# Patient Record
Sex: Female | Born: 1935
Health system: Southern US, Community
[De-identification: ages and names within clinical notes are randomized; demographics above are authoritative.]

## PROBLEM LIST (undated history)

## (undated) DIAGNOSIS — IMO0002 Reserved for concepts with insufficient information to code with codable children: Secondary | ICD-10-CM

## (undated) DIAGNOSIS — G709 Myoneural disorder, unspecified: Secondary | ICD-10-CM

## (undated) DIAGNOSIS — I6529 Occlusion and stenosis of unspecified carotid artery: Secondary | ICD-10-CM

## (undated) DIAGNOSIS — I10 Essential (primary) hypertension: Secondary | ICD-10-CM

## (undated) DIAGNOSIS — G629 Polyneuropathy, unspecified: Secondary | ICD-10-CM

## (undated) DIAGNOSIS — W19XXXA Unspecified fall, initial encounter: Secondary | ICD-10-CM

## (undated) DIAGNOSIS — T7840XA Allergy, unspecified, initial encounter: Secondary | ICD-10-CM

## (undated) DIAGNOSIS — F419 Anxiety disorder, unspecified: Secondary | ICD-10-CM

## (undated) DIAGNOSIS — G43109 Migraine with aura, not intractable, without status migrainosus: Secondary | ICD-10-CM

## (undated) DIAGNOSIS — E7800 Pure hypercholesterolemia, unspecified: Secondary | ICD-10-CM

## (undated) DIAGNOSIS — M1990 Unspecified osteoarthritis, unspecified site: Secondary | ICD-10-CM

## (undated) HISTORY — DX: Migraine with aura, not intractable, without status migrainosus: G43.109

## (undated) HISTORY — PX: ABDOMINAL HYSTERECTOMY: SHX81

## (undated) HISTORY — PX: SMALL INTESTINE SURGERY: SHX150

## (undated) HISTORY — DX: Essential (primary) hypertension: I10

## (undated) HISTORY — DX: Unspecified fall, initial encounter: W19.XXXA

## (undated) HISTORY — DX: Pure hypercholesterolemia, unspecified: E78.00

## (undated) HISTORY — DX: Occlusion and stenosis of unspecified carotid artery: I65.29

## (undated) HISTORY — PX: TONSILLECTOMY: SUR1361

## (undated) HISTORY — PX: TUBAL LIGATION: SHX77

## (undated) HISTORY — DX: Anxiety disorder, unspecified: F41.9

## (undated) HISTORY — DX: Allergy, unspecified, initial encounter: T78.40XA

## (undated) HISTORY — DX: Reserved for concepts with insufficient information to code with codable children: IMO0002

## (undated) HISTORY — DX: Polyneuropathy, unspecified: G62.9

## (undated) HISTORY — DX: Unspecified osteoarthritis, unspecified site: M19.90

## (undated) HISTORY — PX: APPENDECTOMY: SHX54

## (undated) HISTORY — DX: Myoneural disorder, unspecified: G70.9

---

## 2004-06-21 ENCOUNTER — Ambulatory Visit: Payer: Self-pay | Admitting: Unknown Physician Specialty

## 2004-06-26 ENCOUNTER — Ambulatory Visit: Payer: Self-pay | Admitting: Unknown Physician Specialty

## 2005-03-29 ENCOUNTER — Ambulatory Visit: Payer: Self-pay | Admitting: Unknown Physician Specialty

## 2008-08-11 ENCOUNTER — Ambulatory Visit: Payer: Self-pay | Admitting: Unknown Physician Specialty

## 2008-12-22 ENCOUNTER — Emergency Department: Payer: Self-pay | Admitting: Emergency Medicine

## 2009-09-07 ENCOUNTER — Ambulatory Visit: Payer: Self-pay | Admitting: Unknown Physician Specialty

## 2009-09-22 ENCOUNTER — Ambulatory Visit: Payer: Self-pay | Admitting: Gastroenterology

## 2010-09-18 LAB — HM PAP SMEAR

## 2010-10-30 LAB — HM MAMMOGRAPHY: HM Mammogram: NORMAL

## 2010-12-06 ENCOUNTER — Ambulatory Visit: Payer: Self-pay | Admitting: Unknown Physician Specialty

## 2011-03-21 ENCOUNTER — Ambulatory Visit (INDEPENDENT_AMBULATORY_CARE_PROVIDER_SITE_OTHER): Payer: Medicare Other | Admitting: Internal Medicine

## 2011-03-21 ENCOUNTER — Encounter: Payer: Self-pay | Admitting: Internal Medicine

## 2011-03-21 DIAGNOSIS — G4700 Insomnia, unspecified: Secondary | ICD-10-CM

## 2011-03-21 DIAGNOSIS — M533 Sacrococcygeal disorders, not elsewhere classified: Secondary | ICD-10-CM

## 2011-03-21 DIAGNOSIS — E785 Hyperlipidemia, unspecified: Secondary | ICD-10-CM | POA: Insufficient documentation

## 2011-03-21 DIAGNOSIS — Z79899 Other long term (current) drug therapy: Secondary | ICD-10-CM

## 2011-03-21 DIAGNOSIS — M810 Age-related osteoporosis without current pathological fracture: Secondary | ICD-10-CM | POA: Insufficient documentation

## 2011-03-21 DIAGNOSIS — F409 Phobic anxiety disorder, unspecified: Secondary | ICD-10-CM | POA: Insufficient documentation

## 2011-03-21 NOTE — Assessment & Plan Note (Signed)
Secondary to DDD by prior orthopedic evaluations.  No dradiation behond thigh .  Managed with massage and behavoral  modificaiton.  Prior reaction to meloxicam caused severe blinding headache so will avoid NSAIDs  if possiblre

## 2011-03-21 NOTE — Assessment & Plan Note (Signed)
ambien dependent.  Has tried alternatives including ativan, rozerem which caused nightmares.  otc benadryl and melatonin.

## 2011-03-21 NOTE — Assessment & Plan Note (Signed)
She is due for CMET now,  Lipids in 3 months.  Records requested. Tolerating statin therapy

## 2011-03-21 NOTE — Progress Notes (Signed)
Subjective:    Patient ID: Laura [name], female    DOB: 08-Nov-1932, 78 y.o.   MRN: [id number]  HPI  Laura [name] is a very pleasant 78 yr old white female with a history of osteoporosis and prior nonvertebral fractures, on intermittent therapy,  Who presents to establish primary care in transfer from Dr. Lin Givens.  She has no chief complaints today except recurrent right sacroiliac back pain which is intermittent and aggravated by certain activities of bending and lifting.  She treats it with massage and stretching.  No daily medications,  Radiates to  Right thigh but not beyond.  Prior orthopedic evaluation suggested DDD.    Past Medical History  Diagnosis Date  . Osteoporosis    No current outpatient prescriptions on file prior to visit.   Review of Systems  Constitutional: Negative for fever, chills and unexpected weight change.  HENT: Negative for hearing loss, ear pain, nosebleeds, congestion, sore throat, facial swelling, rhinorrhea, sneezing, mouth sores, trouble swallowing, neck pain, neck stiffness, voice change, postnasal drip, sinus pressure, tinnitus and ear discharge.   Eyes: Negative for pain, discharge, redness and visual disturbance.  Respiratory: Negative for cough, chest tightness, shortness of breath, wheezing and stridor.   Cardiovascular: Negative for chest pain, palpitations and leg swelling.  Musculoskeletal: Positive for back pain. Negative for myalgias and arthralgias.  Skin: Negative for color change and rash.  Neurological: Negative for dizziness, weakness, light-headedness and headaches.  Hematological: Negative for adenopathy.  Psychiatric/Behavioral: Positive for sleep disturbance.       Objective:   Physical Exam  Constitutional: She is oriented to person, place, and time. She appears well-developed and well-nourished.  HENT:  Mouth/Throat: Oropharynx is clear and moist.  Eyes: EOM are normal. Pupils are equal, round, and reactive to light. No scleral  icterus.  Neck: Normal range of motion. Neck supple. No JVD present. No thyromegaly present.  Cardiovascular: Normal rate, regular rhythm, normal heart sounds and intact distal pulses.   Pulmonary/Chest: Effort normal and breath sounds normal.  Abdominal: Soft. Bowel sounds are normal. She exhibits no mass. There is no tenderness.  Musculoskeletal: Normal range of motion. She exhibits no edema.  Lymphadenopathy:    She has no cervical adenopathy.  Neurological: She is alert and oriented to person, place, and time.  Skin: Skin is warm and dry.  Psychiatric: She has a normal mood and affect.          Assessment & Plan:   Osteoporosis History of fosomax use intermittently,  History of avulsion fracture of right foot and separate incident  involving fall onto outstretched hand causing  right wrist from a fall.  Exercises regularly. Discussed holiday from bisphosphonates  And repeat DEX in one year.   Sacroiliac pain Secondary to DDD by prior orthopedic evaluations.  No dradiation behond thigh .  Managed with massage and behavoral  modificaiton.  Prior reaction to meloxicam caused severe blinding headache so will avoid NSAIDs  if possiblre   Insomnia ambien dependent.  Has tried alternatives including ativan, rozerem which caused nightmares.  otc benadryl and melatonin.    Hyperlipidemia LDL goal < 130 She is due for CMET now,  Lipids in 3 months.  Records requested. Tolerating statin therapy    Updated Medication List Outpatient Encounter Prescriptions as of 03/20/1974  Medication Sig Dispense Refill  . aspirin 81 MG tablet Take 81 mg by mouth daily.      . calcium carbonate (OS-CAL) 600 MG TABS Take 600 mg by mouth 2 (two)  times daily with a meal.      . Cholecalciferol (VITAMIN D3) 1000 UNITS CAPS Take one by mouth twice a day.      . Lutein 20 MG TABS Take one by mouth daily      . pravastatin (PRAVACHOL) 40 MG tablet Take 40 mg by mouth daily.      Marland Kitchen  triamterene-hydrochlorothiazide (MAXZIDE-25) 37.5-25 MG per tablet Take 1 tablet by mouth daily.      Marland Kitchen zolpidem (AMBIEN) 5 MG tablet Take 5 mg by mouth at bedtime as needed.

## 2011-03-21 NOTE — Assessment & Plan Note (Addendum)
History of fosomax use intermittently,  History of avulsion fracture of right foot and separate incident  involving fall onto outstretched hand causing  right wrist from a fall.  Exercises regularly. Discussed holiday from bisphosphonates  And repeat DEX in one year.

## 2011-07-17 ENCOUNTER — Other Ambulatory Visit: Payer: Self-pay | Admitting: Internal Medicine

## 2011-07-17 MED ORDER — ZOLPIDEM TARTRATE 5 MG PO TABS: 5.0000 mg | ORAL_TABLET | Freq: Every evening | ORAL | Status: DC | PRN

## 2011-07-19 ENCOUNTER — Other Ambulatory Visit: Payer: Self-pay | Admitting: Internal Medicine

## 2011-09-18 ENCOUNTER — Encounter: Payer: Self-pay | Admitting: Internal Medicine

## 2011-09-18 ENCOUNTER — Ambulatory Visit (INDEPENDENT_AMBULATORY_CARE_PROVIDER_SITE_OTHER): Payer: Medicare Other | Admitting: Internal Medicine

## 2011-09-18 VITALS — BP 126/82 | HR 78 | Temp 97.9°F | Resp 16 | Wt 159.5 lb

## 2011-09-18 DIAGNOSIS — E785 Hyperlipidemia, unspecified: Secondary | ICD-10-CM

## 2011-09-18 DIAGNOSIS — R5383 Other fatigue: Secondary | ICD-10-CM

## 2011-09-18 DIAGNOSIS — R5381 Other malaise: Secondary | ICD-10-CM

## 2011-09-18 DIAGNOSIS — G5790 Unspecified mononeuropathy of unspecified lower limb: Secondary | ICD-10-CM

## 2011-09-18 DIAGNOSIS — G5793 Unspecified mononeuropathy of bilateral lower limbs: Secondary | ICD-10-CM | POA: Insufficient documentation

## 2011-09-18 DIAGNOSIS — M810 Age-related osteoporosis without current pathological fracture: Secondary | ICD-10-CM

## 2011-09-18 DIAGNOSIS — E538 Deficiency of other specified B group vitamins: Secondary | ICD-10-CM

## 2011-09-18 MED ORDER — TRIAMTERENE-HCTZ 37.5-25 MG PO TABS: 1.0000 | ORAL_TABLET | Freq: Every day | ORAL | Status: DC

## 2011-09-18 MED ORDER — PRAVASTATIN SODIUM 40 MG PO TABS: 40.0000 mg | ORAL_TABLET | Freq: Every day | ORAL | Status: DC

## 2011-09-18 NOTE — Progress Notes (Signed)
Patient ID: Laura [name], female   DOB: [birth year], 78 y.o.   MRN: [id number]  Patient Active Problem List  Diagnosis  . Osteoporosis  . Hyperlipidemia LDL goal < 130  . Sacroiliac pain  . Insomnia  . Neuropathy of both feet    Subjective:  CC:   Chief Complaint  Patient presents with  . Follow-up    6 month    HPI:   Laura Dupreeis a 78 y.o. female who presents for follow up on chronic issues.  She feels generally well but has developed bilateral tingling of the toes on both feet. She has had the symptoms for approximately one year.  No prior workup  .  Symptoms are mild,  And are not causing insomnia.  Has occasional low back pain but no radiculopathy. Has a history of well water use as a child but not in over 30 years.    Past Medical History  Diagnosis Date  . Osteoporosis     Past Surgical History  Procedure Date  . Tubal ligation     at age 79, vaginal complicated by peritonitis  . Abdominal hysterectomy     secondary to peritonitis and complications          The following portions of the patient's history were reviewed and updated as appropriate: Allergies, current medications, and problem list.    Review of Systems:   A comprehensive ROS was done and positive for bilateral foot numbness.  The rest was negative.      History   Social History  . Marital Status: Married    Spouse Name: N/A    Number of Children: N/A  . Years of Education: N/A   Occupational History  . Not on file.   Social History Main Topics  . Smoking status: Former Smoker    Quit date: 03/20/1981  . Smokeless tobacco: Never Used  . Alcohol Use: 8.4 oz/week    14 Glasses of wine per week  . Drug Use: No  . Sexually Active: Not on file   Other Topics Concern  . Not on file   Social History Narrative  . No narrative on file    Objective:  BP 126/82  Pulse 78  Temp 97.9 F (36.6 C) (Oral)  Resp 16  Wt 159 lb 8 oz (72.349 kg)  SpO2 95%  General  appearance: alert, cooperative and appears stated age Ears: normal TM's and external ear canals both ears Throat: lips, mucosa, and tongue normal; teeth and gums normal Neck: no adenopathy, no carotid bruit, supple, symmetrical, trachea midline and thyroid not enlarged, symmetric, no tenderness/mass/nodules Back: symmetric, no curvature. ROM normal. No CVA tenderness. Lungs: clear to auscultation bilaterally Heart: regular rate and rhythm, S1, S2 normal, no murmur, click, rub or gallop Abdomen: soft, non-tender; bowel sounds normal; no masses,  no organomegaly Pulses: 2+ and symmetric Skin: Skin color, texture, turgor normal. No rashes or lesions Lymph nodes: Cervical, supraclavicular, and axillary nodes normal.  Assessment and Plan:  Osteoporosis Last DEXA at [facility] date unclear.    Took over 5 yeas of fosomax ,  fdoes not want treatment unless next DEXA shows worsening of t scores.,  Discussed altnernatives/.   Neuropathy of both feet Will return for serologies for reversible causes. sympotms mild, does not want medication at this time.    Hyperlipidemia LDL goal < 130 Managed with pravasttin . Due for cmet and fasting lipids   Updated Medication List Outpatient Encounter Prescriptions as of 09/18/2011  Medication Sig  Dispense Refill  . aspirin 81 MG tablet Take 81 mg by mouth daily.      . calcium carbonate (OS-CAL) 600 MG TABS Take 600 mg by mouth 2 (two) times daily with a meal.      . Cholecalciferol (VITAMIN D3) 1000 UNITS CAPS Take one by mouth twice a day.      . Lutein 20 MG TABS Take one by mouth daily      . pravastatin (PRAVACHOL) 40 MG tablet Take 1 tablet (40 mg total) by mouth daily.  90 tablet  3  . triamterene-hydrochlorothiazide (MAXZIDE-25) 37.5-25 MG per tablet Take 1 each (1 tablet total) by mouth daily.  90 tablet  3  . zolpidem (AMBIEN) 5 MG tablet Take 1 tablet (5 mg total) by mouth at bedtime as needed.  90 tablet  1  . DISCONTD: pravastatin (PRAVACHOL) 40  MG tablet Take 40 mg by mouth daily.      Marland Kitchen DISCONTD: triamterene-hydrochlorothiazide (MAXZIDE-25) 37.5-25 MG per tablet Take 1 tablet by mouth daily.         Orders Placed This Encounter  Procedures  . HM PAP SMEAR  . Vitamin B12  . TSH  . RPR  . Lipid panel    No Follow-up on file.

## 2011-09-18 NOTE — Patient Instructions (Signed)
Return for fasting labs to evaluate reversible causes of neuropathy and to check lipid status

## 2011-09-18 NOTE — Assessment & Plan Note (Signed)
Last DEXA at [facility] date unclear.    Took over 5 yeas of fosomax ,  fdoes not want treatment unless next DEXA shows worsening of t scores.,  Discussed altnernatives/.

## 2011-09-18 NOTE — Assessment & Plan Note (Addendum)
Managed with pravasttin . Due for cmet and fasting lipids

## 2011-09-18 NOTE — Assessment & Plan Note (Addendum)
Will return for serologies for reversible causes. sympotms mild, does not want medication at this time.

## 2011-09-20 ENCOUNTER — Other Ambulatory Visit: Payer: Medicare Other

## 2011-09-21 ENCOUNTER — Other Ambulatory Visit: Payer: Medicare Other

## 2011-09-27 ENCOUNTER — Telehealth: Payer: Self-pay | Admitting: Internal Medicine

## 2011-09-27 ENCOUNTER — Other Ambulatory Visit (INDEPENDENT_AMBULATORY_CARE_PROVIDER_SITE_OTHER): Payer: Medicare Other | Admitting: *Deleted

## 2011-09-27 DIAGNOSIS — E538 Deficiency of other specified B group vitamins: Secondary | ICD-10-CM

## 2011-09-27 DIAGNOSIS — R5381 Other malaise: Secondary | ICD-10-CM

## 2011-09-27 DIAGNOSIS — R5383 Other fatigue: Secondary | ICD-10-CM

## 2011-09-27 DIAGNOSIS — Z79899 Other long term (current) drug therapy: Secondary | ICD-10-CM

## 2011-09-27 DIAGNOSIS — E785 Hyperlipidemia, unspecified: Secondary | ICD-10-CM

## 2011-09-27 DIAGNOSIS — M810 Age-related osteoporosis without current pathological fracture: Secondary | ICD-10-CM

## 2011-09-27 DIAGNOSIS — G5793 Unspecified mononeuropathy of bilateral lower limbs: Secondary | ICD-10-CM

## 2011-09-27 LAB — COMPREHENSIVE METABOLIC PANEL WITH GFR
ALT: 15 U/L (ref 0–35)
AST: 18 U/L (ref 0–37)
Albumin: 4.1 g/dL (ref 3.5–5.2)
Alkaline Phosphatase: 57 U/L (ref 39–117)
BUN: 14 mg/dL (ref 6–23)
CO2: 26 meq/L (ref 19–32)
Calcium: 9.2 mg/dL (ref 8.4–10.5)
Chloride: 104 meq/L (ref 96–112)
Creatinine, Ser: 0.8 mg/dL (ref 0.4–1.2)
GFR: 79.67 mL/min
Glucose, Bld: 98 mg/dL (ref 70–99)
Potassium: 3.8 meq/L (ref 3.5–5.1)
Sodium: 139 meq/L (ref 135–145)
Total Bilirubin: 0.9 mg/dL (ref 0.3–1.2)
Total Protein: 7 g/dL (ref 6.0–8.3)

## 2011-09-27 LAB — LIPID PANEL
Cholesterol: 212 mg/dL — ABNORMAL HIGH (ref 0–200)
HDL: 90.8 mg/dL (ref >39.00)
Triglycerides: 100 mg/dL (ref 0.0–149.0)
VLDL: 20 mg/dL (ref 0.0–40.0)

## 2011-09-27 NOTE — Telephone Encounter (Signed)
Now.  Ordered

## 2011-09-27 NOTE — Telephone Encounter (Signed)
Pt came in today  Her last bone density was 09/22/09 Please advise when she needs another one [place]

## 2011-09-28 LAB — RPR

## 2011-10-08 ENCOUNTER — Ambulatory Visit: Payer: Medicare Other

## 2011-10-22 ENCOUNTER — Ambulatory Visit (INDEPENDENT_AMBULATORY_CARE_PROVIDER_SITE_OTHER): Payer: Medicare Other | Admitting: Internal Medicine

## 2011-10-22 DIAGNOSIS — E538 Deficiency of other specified B group vitamins: Secondary | ICD-10-CM

## 2011-10-22 MED ORDER — CYANOCOBALAMIN 1000 MCG/ML IJ SOLN
1000.0000 ug | Freq: Once | INTRAMUSCULAR | Status: AC
  Administered 2011-10-22: 1000 ug via INTRAMUSCULAR

## 2011-10-29 ENCOUNTER — Ambulatory Visit (INDEPENDENT_AMBULATORY_CARE_PROVIDER_SITE_OTHER): Payer: Medicare Other | Admitting: Internal Medicine

## 2011-10-29 ENCOUNTER — Telehealth: Payer: Self-pay | Admitting: Family Medicine

## 2011-10-29 ENCOUNTER — Encounter: Payer: Self-pay | Admitting: Internal Medicine

## 2011-10-29 DIAGNOSIS — E538 Deficiency of other specified B group vitamins: Secondary | ICD-10-CM

## 2011-10-29 MED ORDER — CYANOCOBALAMIN 1000 MCG/ML IJ SOLN
1000.0000 ug | Freq: Once | INTRAMUSCULAR | Status: AC
  Administered 2011-10-29: 1000 ug via INTRAMUSCULAR

## 2011-10-29 NOTE — Progress Notes (Signed)
Patient ID: Laura [name], female   DOB: 1932-02-23, 78 y.o.   MRN: [id number] Patient is here for her B12 injection

## 2011-11-05 ENCOUNTER — Ambulatory Visit (INDEPENDENT_AMBULATORY_CARE_PROVIDER_SITE_OTHER): Payer: Medicare Other | Admitting: Internal Medicine

## 2011-11-05 DIAGNOSIS — E538 Deficiency of other specified B group vitamins: Secondary | ICD-10-CM

## 2011-11-05 MED ORDER — CYANOCOBALAMIN 1000 MCG/ML IJ SOLN
1000.0000 ug | Freq: Once | INTRAMUSCULAR | Status: AC
  Administered 2011-11-05: 1000 ug via INTRAMUSCULAR

## 2011-11-07 NOTE — Progress Notes (Signed)
Patient ID: Laura [name], female   DOB: Jun 22, 1932, 78 y.o.   MRN: [id number] The patient is here for her B12 injection.

## 2011-11-14 ENCOUNTER — Ambulatory Visit (INDEPENDENT_AMBULATORY_CARE_PROVIDER_SITE_OTHER): Payer: Medicare Other | Admitting: Internal Medicine

## 2011-11-14 DIAGNOSIS — Z23 Encounter for immunization: Secondary | ICD-10-CM

## 2011-11-14 DIAGNOSIS — E538 Deficiency of other specified B group vitamins: Secondary | ICD-10-CM

## 2011-11-14 MED ORDER — CYANOCOBALAMIN 1000 MCG/ML IJ SOLN
1000.0000 ug | Freq: Once | INTRAMUSCULAR | Status: AC
  Administered 2011-11-14: 1000 ug via INTRAMUSCULAR

## 2011-11-17 NOTE — Progress Notes (Signed)
Patient ID: Laura [name], female   DOB: 1932/09/22, 78 y.o.   MRN: [id number] Patient is here for her monthly B12 injection.

## 2011-11-20 ENCOUNTER — Ambulatory Visit: Payer: Self-pay | Admitting: Internal Medicine

## 2011-11-25 ENCOUNTER — Telehealth: Payer: Self-pay | Admitting: Internal Medicine

## 2011-11-25 NOTE — Telephone Encounter (Signed)
DEXA scan received. I have tried to compare with her scores from her 2011 DEXA scan that was done external clinic and because they were done on different machines there measuring quite differently. Her T. scores on her hips and spine are in the osteopenia range and her T score on her forearm is in the osteoporosis we will discuss this in more detail at her next visit. There is no urgent need to do anything except continue calcium supplementation (1200 to 1800 mg daily through diet and supplements) , u vitamin D (10000 units dailyshe is vitamin D deficient by testing ) and weightbearing exercise.

## 2011-11-26 NOTE — Telephone Encounter (Signed)
Patient informed of dexa results.  

## 2011-12-03 ENCOUNTER — Encounter: Payer: Self-pay | Admitting: Internal Medicine

## 2011-12-05 LAB — HM DEXA SCAN

## 2011-12-21 NOTE — Telephone Encounter (Signed)
Opened in error

## 2012-01-18 ENCOUNTER — Other Ambulatory Visit: Payer: Self-pay | Admitting: Internal Medicine

## 2012-01-18 NOTE — Telephone Encounter (Signed)
Refill request for Ambien 5 mg # 90 ok to refill?

## 2012-01-18 NOTE — Telephone Encounter (Signed)
Zolpidem 5 mg   [facility] on [address]  90 day

## 2012-01-20 NOTE — Telephone Encounter (Signed)
Approved with 1 refill

## 2012-01-21 NOTE — Telephone Encounter (Signed)
Medication filled.  

## 2012-02-21 ENCOUNTER — Ambulatory Visit: Payer: Self-pay | Admitting: Internal Medicine

## 2012-03-05 ENCOUNTER — Encounter: Payer: Self-pay | Admitting: Internal Medicine

## 2012-04-01 ENCOUNTER — Ambulatory Visit: Payer: Medicare Other | Admitting: Internal Medicine

## 2012-04-15 ENCOUNTER — Ambulatory Visit: Payer: Medicare Other | Admitting: Internal Medicine

## 2012-04-16 ENCOUNTER — Ambulatory Visit (INDEPENDENT_AMBULATORY_CARE_PROVIDER_SITE_OTHER): Payer: Medicare Other | Admitting: Internal Medicine

## 2012-04-16 ENCOUNTER — Encounter: Payer: Self-pay | Admitting: Internal Medicine

## 2012-04-16 VITALS — BP 120/80 | HR 78 | Temp 98.0°F | Ht 64.0 in | Wt 151.2 lb

## 2012-04-16 DIAGNOSIS — F411 Generalized anxiety disorder: Secondary | ICD-10-CM | POA: Insufficient documentation

## 2012-04-16 DIAGNOSIS — E538 Deficiency of other specified B group vitamins: Secondary | ICD-10-CM

## 2012-04-16 DIAGNOSIS — R4589 Other symptoms and signs involving emotional state: Secondary | ICD-10-CM | POA: Insufficient documentation

## 2012-04-16 DIAGNOSIS — Z79899 Other long term (current) drug therapy: Secondary | ICD-10-CM

## 2012-04-16 DIAGNOSIS — G609 Hereditary and idiopathic neuropathy, unspecified: Secondary | ICD-10-CM

## 2012-04-16 DIAGNOSIS — G4700 Insomnia, unspecified: Secondary | ICD-10-CM

## 2012-04-16 DIAGNOSIS — F418 Other specified anxiety disorders: Secondary | ICD-10-CM | POA: Insufficient documentation

## 2012-04-16 LAB — COMPREHENSIVE METABOLIC PANEL
BUN: 17 mg/dL (ref 6–23)
CO2: 26 mEq/L (ref 19–32)
Calcium: 10.2 mg/dL (ref 8.4–10.5)
Chloride: 100 mEq/L (ref 96–112)
Creatinine, Ser: 0.7 mg/dL (ref 0.4–1.2)
GFR: 89.07 mL/min (ref >60.00)

## 2012-04-16 LAB — POCT URINALYSIS DIPSTICK
Glucose, UA: NEGATIVE
Leukocytes, UA: NEGATIVE
Nitrite, UA: NEGATIVE
Urobilinogen, UA: 0.2

## 2012-04-16 MED ORDER — CYANOCOBALAMIN 1000 MCG/ML IJ SOLN
1000.0000 ug | Freq: Once | INTRAMUSCULAR | Status: AC
  Administered 2012-04-16: 1000 ug via INTRAMUSCULAR

## 2012-04-16 MED ORDER — ALPRAZOLAM 0.5 MG PO TABS: 0.5000 mg | ORAL_TABLET | Freq: Every evening | ORAL | Status: DC | PRN

## 2012-04-16 MED ORDER — CYANOCOBALAMIN 2500 MCG SL SUBL: 1.0000 | SUBLINGUAL_TABLET | Freq: Every day | SUBLINGUAL | Status: DC

## 2012-04-16 MED ORDER — ZOLPIDEM TARTRATE 10 MG PO TABS: ORAL_TABLET | ORAL | Status: DC

## 2012-04-16 NOTE — Patient Instructions (Addendum)
You can continue 2 advil nightly.  Take 20 mg famotidine daily  to protect stomach  I am refilling the ambien at a 10 mg dose so you can break it in half and use as needed  Resume b12 supplememataiton wit a sublingual tablet dailoy   Alprazolam may be used as needed not more than once daily for anxiety or early wakening

## 2012-04-16 NOTE — Progress Notes (Signed)
Patient ID: Laura [name], female   DOB: 10-Mar-1932, 79 y.o.   MRN: [id number]  Patient Active Problem List  Diagnosis  . Osteoporosis  . Hyperlipidemia LDL goal < 130  . Sacroiliac pain  . Insomnia  . Neuropathy of both feet  . B12 deficiency  . Anxiety state, unspecified    Subjective:  CC:   Chief Complaint  Patient presents with  . Follow-up    6 month    HPI: For   Laura [name] a 79 y.o. female who presents 6 month follow up.  Chronic complaints.  Received 3 weekly b12 injectiosn in October but was lostto follow up after husband's hospitalization.  Her neuroapathy has not improved.   Using advil for hip pain  2 every night ,  Which alleviates her pain .  Worried if it is too much .  She has no history of CKD, hypertension or abd pain.  Worsening emotional stress due to husband's failing health.  Taking 10 ambien most days bc of insomnia. starts with 5 mg .  Husband Laura [name] was admitted ofr symptomatic anemia,  EGD.colonoscopy was normal.  Transfused 3 units of blood.  Oncologist advised starting chemo and they are both afraid of neuropathy getting worse. 2 treatments thus far .  Patient is now homebound      Past Medical History  Diagnosis Date  . Osteoporosis     Past Surgical History  Procedure Laterality Date  . Tubal ligation      at age 6, vaginal complicated by peritonitis  . Abdominal hysterectomy      secondary to peritonitis and complications        The following portions of the patient's history were reviewed and updated as appropriate: Allergies, current medications, and problem list.    Review of Systems:   Patient denies headache, fevers, malaise, unintentional weight loss, skin rash, eye pain, sinus congestion and sinus pain, sore throat, dysphagia,  hemoptysis , cough, dyspnea, wheezing, chest pain, palpitations, orthopnea, edema, abdominal pain, nausea, melena, diarrhea, constipation, flank pain, dysuria, hematuria, urinary  Frequency,  nocturia, numbness, tingling, seizures,  Focal weakness, Loss of consciousness,  Tremor, insomnia, depression, anxiety, and suicidal ideation.     History   Social History  . Marital Status: Married    Spouse Name: N/A    Number of Children: N/A  . Years of Education: N/A   Occupational History  . Not on file.   Social History Main Topics  . Smoking status: Former Smoker    Quit date: 03/20/1981  . Smokeless tobacco: Never Used  . Alcohol Use: 8.4 oz/week    14 Glasses of wine per week  . Drug Use: No  . Sexually Active: Not on file   Other Topics Concern  . Not on file   Social History Narrative  . No narrative on file    Objective:  BP 120/80  Pulse 78  Temp(Src) 98 F (36.7 C) (Oral)  Ht 5\' 4"  (1.626 m)  Wt 151 lb 4 oz (68.607 kg)  BMI 25.95 kg/m2  SpO2 94%  General appearance: alert, cooperative and appears stated age Ears: normal TM's and external ear canals both ears Throat: lips, mucosa, and tongue normal; teeth and gums normal Neck: no adenopathy, no carotid bruit, supple, symmetrical, trachea midline and thyroid not enlarged, symmetric, no tenderness/mass/nodules Back: symmetric, no curvature. ROM normal. No CVA tenderness. Lungs: clear to auscultation bilaterally Heart: regular rate and rhythm, S1, S2 normal, no murmur, click, rub or gallop Abdomen:  soft, non-tender; bowel sounds normal; no masses,  no organomegaly Pulses: 2+ and symmetric Skin: Skin color, texture, turgor normal. No rashes or lesions Lymph nodes: Cervical, supraclavicular, and axillary nodes normal.  Assessment and Plan:  B12 deficiency Resume supplemention with IM injection followed by daily sublinguals  Insomnia Using 5 mg ambien nightly with early wakening due to husband's need. managed with additional 5 mg ambien   Anxiety state, unspecified Low dose alprazolam for prn use .   Updated Medication List Outpatient Encounter Prescriptions as of 04/16/2012  Medication Sig  Dispense Refill  . aspirin 81 MG tablet Take 81 mg by mouth daily.      . calcium carbonate (OS-CAL) 600 MG TABS Take 600 mg by mouth 2 (two) times daily with a meal.      . Cholecalciferol (VITAMIN D3) 1000 UNITS CAPS Take one by mouth twice a day.      . cyanocobalamin 1000 MCG tablet Inject 100 mcg into the muscle once a week. Weekly x 3 weeks then monthly      . Lutein 20 MG TABS Take one by mouth daily      . pravastatin (PRAVACHOL) 40 MG tablet Take 1 tablet (40 mg total) by mouth daily.  90 tablet  3  . triamterene-hydrochlorothiazide (MAXZIDE-25) 37.5-25 MG per tablet Take 1 each (1 tablet total) by mouth daily.  90 tablet  3  . zolpidem (AMBIEN) 10 MG tablet take 1 tablet by mouth at bedtime if needed  30 tablet  5  . [DISCONTINUED] zolpidem (AMBIEN) 5 MG tablet take 1 tablet by mouth at bedtime if needed  30 tablet  1  . ALPRAZolam (XANAX) 0.5 MG tablet Take 1 tablet (0.5 mg total) by mouth at bedtime as needed for sleep.  30 tablet  3  . Cyanocobalamin 2500 MCG SUBL Place 1 tablet (2,500 mcg total) under the tongue daily.  30 tablet  11  . [EXPIRED] cyanocobalamin ((VITAMIN B-12)) injection 1,000 mcg        No facility-administered encounter medications on file as of 04/16/2012.     Orders Placed This Encounter  Procedures  . HM MAMMOGRAPHY  . Comprehensive metabolic panel  . POCT urinalysis dipstick    No Follow-up on file.

## 2012-04-16 NOTE — Assessment & Plan Note (Signed)
Resume supplemention with IM injection followed by daily sublinguals

## 2012-04-16 NOTE — Assessment & Plan Note (Signed)
Low dose alprazolam for prn use .

## 2012-04-16 NOTE — Assessment & Plan Note (Signed)
Using 5 mg ambien nightly with early wakening due to husband's need. managed with additional 5 mg Remus Loffler

## 2012-04-17 ENCOUNTER — Encounter: Payer: Self-pay | Admitting: Internal Medicine

## 2012-09-03 ENCOUNTER — Other Ambulatory Visit: Payer: Self-pay

## 2012-10-15 ENCOUNTER — Ambulatory Visit: Payer: Self-pay | Admitting: Internal Medicine

## 2012-10-17 ENCOUNTER — Ambulatory Visit (INDEPENDENT_AMBULATORY_CARE_PROVIDER_SITE_OTHER): Payer: Medicare PPO | Admitting: Internal Medicine

## 2012-10-17 ENCOUNTER — Encounter: Payer: Self-pay | Admitting: Internal Medicine

## 2012-10-17 VITALS — BP 128/72 | HR 92 | Temp 98.4°F | Resp 14 | Ht 64.0 in | Wt 143.2 lb

## 2012-10-17 DIAGNOSIS — R634 Abnormal weight loss: Secondary | ICD-10-CM

## 2012-10-17 DIAGNOSIS — E538 Deficiency of other specified B group vitamins: Secondary | ICD-10-CM

## 2012-10-17 DIAGNOSIS — F4321 Adjustment disorder with depressed mood: Secondary | ICD-10-CM

## 2012-10-17 DIAGNOSIS — E559 Vitamin D deficiency, unspecified: Secondary | ICD-10-CM

## 2012-10-17 DIAGNOSIS — Z79899 Other long term (current) drug therapy: Secondary | ICD-10-CM

## 2012-10-17 DIAGNOSIS — E785 Hyperlipidemia, unspecified: Secondary | ICD-10-CM

## 2012-10-17 DIAGNOSIS — Z23 Encounter for immunization: Secondary | ICD-10-CM

## 2012-10-17 DIAGNOSIS — I10 Essential (primary) hypertension: Secondary | ICD-10-CM

## 2012-10-17 DIAGNOSIS — M810 Age-related osteoporosis without current pathological fracture: Secondary | ICD-10-CM

## 2012-10-17 DIAGNOSIS — R5381 Other malaise: Secondary | ICD-10-CM

## 2012-10-17 LAB — COMPREHENSIVE METABOLIC PANEL
Alkaline Phosphatase: 50 U/L (ref 39–117)
BUN: 15 mg/dL (ref 6–23)
CO2: 29 mEq/L (ref 19–32)
Creatinine, Ser: 0.7 mg/dL (ref 0.4–1.2)
GFR: 80.69 mL/min (ref >60.00)
Glucose, Bld: 97 mg/dL (ref 70–99)
Sodium: 138 mEq/L (ref 135–145)
Total Bilirubin: 0.6 mg/dL (ref 0.3–1.2)

## 2012-10-17 LAB — TSH: TSH: 1.64 u[IU]/mL (ref 0.35–5.50)

## 2012-10-17 LAB — VITAMIN B12: Vitamin B-12: 741 pg/mL (ref 211–911)

## 2012-10-17 MED ORDER — PRAVASTATIN SODIUM 40 MG PO TABS: 40.0000 mg | ORAL_TABLET | Freq: Every day | ORAL | Status: DC

## 2012-10-17 MED ORDER — ZOLPIDEM TARTRATE 10 MG PO TABS: ORAL_TABLET | ORAL | Status: DC

## 2012-10-17 MED ORDER — TRIAMTERENE-HCTZ 37.5-25 MG PO TABS: 1.0000 | ORAL_TABLET | Freq: Every day | ORAL | Status: DC

## 2012-10-17 NOTE — Patient Instructions (Addendum)
We will schedule your DEXA scan in November  If you find yourself needing the xanax (alprazolam) on a regular basis,  Please come back so we can consider a safer daily medication to help you "bounce back"

## 2012-10-17 NOTE — Progress Notes (Signed)
Patient ID: Laura [name], female   DOB: [birth year], 79 y.o.   MRN: [id number]  Patient Active Problem List   Diagnosis Date Noted  . Grief 10/19/2012  . HTN (hypertension) 10/19/2012  . Loss of weight 10/17/2012  . B12 deficiency 04/16/2012  . Anxiety state, unspecified 04/16/2012  . Neuropathy of both feet 09/18/2011  . Hyperlipidemia LDL goal < 130 03/21/2011  . Sacroiliac pain 03/21/2011  . Insomnia 03/21/2011  . Osteoporosis     Subjective:  CC:   Chief Complaint  Patient presents with  . Follow-up    6 month follow up    HPI:   Laura Dupreeis a 79 y.o. female who presents 6 month follow up on chronic condition including B12 deficiency anemia, hypertension, chronic insomnia and arthritis of hip .  Her beloved husband of 51 years passed away 2 weeks ago after fighting metastatic prostate CA for many years.  He spent the last 8 weeks in a hospital bed at home with Hospice in place.  She is having difficulty accepting that he is no longer with her, despite his protracted illness. She has spent the last 2 years of her life to taking care of him 24/7 and now feels a very large void in her life . She has a very strong social support system. She's having some trouble sleeping she has not tried the alprazolam that I gave her at her last visit. She is afraid of becoming hooked on it..  she has very recently adopted some healthy coping behaviors including joining a gym and going for the first time in years 2 days ago. She is going out and meeting friends for dinner. All of her children have been very supportive and were physicially present in the father's lives in the weeks up leading up to his death    Past Medical History  Diagnosis Date  . Osteoporosis     Past Surgical History  Procedure Laterality Date  . Tubal ligation      at age 32, vaginal complicated by peritonitis  . Abdominal hysterectomy      secondary to peritonitis and complications        The following  portions of the patient's history were reviewed and updated as appropriate: Allergies, current medications, and problem list.    Review of Systems:   12 Pt  review of systems was negative except those addressed in the HPI,     History   Social History  . Marital Status: Married    Spouse Name: N/A    Number of Children: N/A  . Years of Education: N/A   Occupational History  . Not on file.   Social History Main Topics  . Smoking status: Former Smoker    Quit date: 03/20/1981  . Smokeless tobacco: Never Used  . Alcohol Use: 8.4 oz/week    14 Glasses of wine per week  . Drug Use: No  . Sexual Activity: Not on file   Other Topics Concern  . Not on file   Social History Narrative  . No narrative on file    Objective:  Filed Vitals:   10/17/12 1335  BP: 128/72  Pulse: 92  Temp: 98.4 F (36.9 C)  Resp: 14     General appearance: alert, cooperative and appears stated age Ears: normal TM's and external ear canals both ears Throat: lips, mucosa, and tongue normal; teeth and gums normal Neck: no adenopathy, no carotid bruit, supple, symmetrical, trachea midline and thyroid not enlarged, symmetric, no  tenderness/mass/nodules Back: symmetric, no curvature. ROM normal. No CVA tenderness. Lungs: clear to auscultation bilaterally Heart: regular rate and rhythm, S1, S2 normal, no murmur, click, rub or gallop Abdomen: soft, non-tender; bowel sounds normal; no masses,  no organomegaly Pulses: 2+ and symmetric Skin: Skin color, texture, turgor normal. No rashes or lesions Lymph nodes: Cervical, supraclavicular, and axillary nodes normal.  Assessment and Plan:  Grief She is coping with her grief rather well. Have encouraged her to use the alprazolam if needed for insomnia or anxiety. We'll see her back in one month if she continues to need assistance.  Hyperlipidemia LDL goal < 130 Managed with pravastatin. No side effects. LDL is at goal and hepatic enzymes are normal.  No changes today.  B12 deficiency Managed with sublingual B12 supplements, as she is not inclined to self administer B12 injections. B12 level is pending.  Loss of weight Secondary to grief and anxiety. Diet has been reviewed. She has no signs or symptoms of malnutrition.  HTN (hypertension) Well controlled on current regimen. Renal function stable, no changes today.   Updated Medication List Outpatient Encounter Prescriptions as of 10/17/2012  Medication Sig Dispense Refill  . ALPRAZolam (XANAX) 0.5 MG tablet Take 1 tablet (0.5 mg total) by mouth at bedtime as needed for sleep.  30 tablet  3  . aspirin 81 MG tablet Take 81 mg by mouth daily.      . calcium carbonate (OS-CAL) 600 MG TABS Take 600 mg by mouth 2 (two) times daily with a meal.      . Cholecalciferol (VITAMIN D3) 1000 UNITS CAPS Take one by mouth twice a day.      . cyanocobalamin 1000 MCG tablet Inject 100 mcg into the muscle once a week. Weekly x 3 weeks then monthly      . Cyanocobalamin 2500 MCG SUBL Place 1 tablet (2,500 mcg total) under the tongue daily.  30 tablet  11  . Lutein 20 MG TABS Take one by mouth daily      . pravastatin (PRAVACHOL) 40 MG tablet Take 1 tablet (40 mg total) by mouth daily.  90 tablet  3  . triamterene-hydrochlorothiazide (MAXZIDE-25) 37.5-25 MG per tablet Take 1 tablet by mouth daily.  90 tablet  3  . zolpidem (AMBIEN) 10 MG tablet take 1 tablet by mouth at bedtime if needed  30 tablet  5  . [DISCONTINUED] pravastatin (PRAVACHOL) 40 MG tablet Take 1 tablet (40 mg total) by mouth daily.  90 tablet  3  . [DISCONTINUED] triamterene-hydrochlorothiazide (MAXZIDE-25) 37.5-25 MG per tablet Take 1 each (1 tablet total) by mouth daily.  90 tablet  3  . [DISCONTINUED] zolpidem (AMBIEN) 10 MG tablet take 1 tablet by mouth at bedtime if needed  30 tablet  5   No facility-administered encounter medications on file as of 10/17/2012.     Orders Placed This Encounter  Procedures  . Flu Vaccine QUAD 36+  mos PF IM (Fluarix)  . Comprehensive metabolic panel  . TSH  . Vit D  25 hydroxy (rtn osteoporosis monitoring)  . Vitamin B12    No Follow-up on file.

## 2012-10-18 ENCOUNTER — Encounter: Payer: Self-pay | Admitting: Internal Medicine

## 2012-10-18 LAB — VITAMIN D 25 HYDROXY (VIT D DEFICIENCY, FRACTURES): Vit D, 25-Hydroxy: 55 ng/mL (ref 30–89)

## 2012-10-19 ENCOUNTER — Encounter: Payer: Self-pay | Admitting: Internal Medicine

## 2012-10-19 DIAGNOSIS — F4321 Adjustment disorder with depressed mood: Secondary | ICD-10-CM | POA: Insufficient documentation

## 2012-10-19 DIAGNOSIS — I10 Essential (primary) hypertension: Secondary | ICD-10-CM | POA: Insufficient documentation

## 2012-10-19 NOTE — Assessment & Plan Note (Signed)
Well controlled on current regimen. Renal function stable, no changes today. 

## 2012-10-19 NOTE — Assessment & Plan Note (Signed)
Managed with pravastatin. No side effects. LDL is at goal and hepatic enzymes are normal. No changes today.

## 2012-10-19 NOTE — Assessment & Plan Note (Signed)
Managed with sublingual B12 supplements, as she is not inclined to self administer B12 injections. B12 level is pending.

## 2012-10-19 NOTE — Assessment & Plan Note (Signed)
Secondary to grief and anxiety. Diet has been reviewed. She has no signs or symptoms of malnutrition.

## 2012-10-19 NOTE — Assessment & Plan Note (Signed)
She is coping with her grief rather well. Have encouraged her to use the alprazolam if needed for insomnia or anxiety. We'll see her back in one month if she continues to need assistance.

## 2012-12-31 ENCOUNTER — Ambulatory Visit (INDEPENDENT_AMBULATORY_CARE_PROVIDER_SITE_OTHER): Payer: Medicare PPO | Admitting: Internal Medicine

## 2012-12-31 ENCOUNTER — Encounter: Payer: Self-pay | Admitting: Internal Medicine

## 2012-12-31 VITALS — BP 112/78 | HR 61 | Temp 98.0°F | Resp 14 | Ht 64.0 in | Wt 143.2 lb

## 2012-12-31 DIAGNOSIS — Z90710 Acquired absence of both cervix and uterus: Secondary | ICD-10-CM

## 2012-12-31 DIAGNOSIS — M533 Sacrococcygeal disorders, not elsewhere classified: Secondary | ICD-10-CM

## 2012-12-31 DIAGNOSIS — M810 Age-related osteoporosis without current pathological fracture: Secondary | ICD-10-CM

## 2012-12-31 DIAGNOSIS — R634 Abnormal weight loss: Secondary | ICD-10-CM

## 2012-12-31 DIAGNOSIS — E538 Deficiency of other specified B group vitamins: Secondary | ICD-10-CM

## 2012-12-31 DIAGNOSIS — I10 Essential (primary) hypertension: Secondary | ICD-10-CM

## 2012-12-31 MED ORDER — TETANUS-DIPHTH-ACELL PERTUSSIS 5-2.5-18.5 LF-MCG/0.5 IM SUSP: 0.5000 mL | Freq: Once | INTRAMUSCULAR | Status: DC

## 2012-12-31 MED ORDER — TRAMADOL HCL 50 MG PO TABS: 50.0000 mg | ORAL_TABLET | Freq: Three times a day (TID) | ORAL | Status: DC | PRN

## 2012-12-31 MED ORDER — PREDNISONE (PAK) 10 MG PO TABS: ORAL_TABLET | ORAL | Status: DC

## 2012-12-31 NOTE — Patient Instructions (Signed)
I think you have bursitis of the ischial tuberosity  I am prescribing prednisone taper ,  And tramadol as needed for pain .  Do not take Advil  You are on this  Stay active but avoid strenuous lower extremity activities.   X rays of pelvis at your leisure at the [facility] office

## 2012-12-31 NOTE — Progress Notes (Signed)
Patient ID: Laura [name], female   DOB: [birth year], 79 y.o.   MRN: [id number]   Patient Active Problem List   Diagnosis Date Noted  . Grief 10/19/2012  . HTN (hypertension) 10/19/2012  . Loss of weight 10/17/2012  . B12 deficiency 04/16/2012  . Anxiety state, unspecified 04/16/2012  . Neuropathy of both feet 09/18/2011  . Hyperlipidemia LDL goal < 130 03/21/2011  . Sacroiliac pain 03/21/2011  . Insomnia 03/21/2011  . Osteoporosis     Subjective:  CC:   Chief Complaint  Patient presents with  . Back Pain    lower back, sacral area    HPI:   Laura Dupreeis a 79 y.o. female who presents Low back pain .  She has been having  stiffness and pain  For the last several weeks with focal tenderness over the right Right ischial tuberosity.  Her pain is aggravated by prolonged sitting  And exercising .  It does not radiate down either leg.  There is no history of trauma, joint replacement, or overuse.  Denies  Fevers,  And previously noted weight loss has stabilized.    Past Medical History  Diagnosis Date  . Osteoporosis     Past Surgical History  Procedure Laterality Date  . Tubal ligation      at age 38, vaginal complicated by peritonitis  . Abdominal hysterectomy      secondary to peritonitis and complications        The following portions of the patient's history were reviewed and updated as appropriate: Allergies, current medications, and problem list.    Review of Systems:   12 Pt  review of systems was negative except those addressed in the HPI,     History   Social History  . Marital Status: Married    Spouse Name: N/A    Number of Children: N/A  . Years of Education: N/A   Occupational History  . Not on file.   Social History Main Topics  . Smoking status: Former Smoker    Quit date: 03/20/1981  . Smokeless tobacco: Never Used  . Alcohol Use: 8.4 oz/week    14 Glasses of wine per week  . Drug Use: No  . Sexual Activity: Not on file   Other  Topics Concern  . Not on file   Social History Narrative  . No narrative on file    Objective:  Filed Vitals:   12/31/12 1624  BP: 112/78  Pulse: 61  Temp: 98 F (36.7 C)  Resp: 14     General appearance: alert, cooperative and appears stated age Neck: no adenopathy, no carotid bruit, supple, symmetrical, trachea midline and thyroid not enlarged, symmetric, no tenderness/mass/nodules Back: symmetric, no curvature. ROM normal. No CVA tenderness  Right SI joint tender,  R ischial tuberosity tender,  No erythema or effusions  Lungs: clear to auscultation bilaterally Heart: regular rate and rhythm, S1, S2 normal, no murmur, click, rub or gallop Abdomen: soft, non-tender; bowel sounds normal; no masses,  no organomegaly Pulses: 2+ and symmetric Skin: Skin color, texture, turgor normal. No rashes or lesions Lymph nodes: Cervical, supraclavicular, and axillary nodes normal. Neuro: CNs 2-12 intact. DTRs 2+/4 in biceps, brachioradialis, patellars and achilles. Muscle strength 5/5 in upper and lower exremities. Fine resting tremor bilaterally both hands cerebellar function normal. Romberg negative.  No pronator drift.   Gait normal.   Assessment and Plan:  Loss of weight 16 lbs over the past year, unintentional, secondary to emotional stressors including the decline  and recent loss of beloved husband Chanetta Marshall.   Sacroiliac pain SI joint arthriitis accompanied by bursitis of the right ischial tuberosity.   Prednisone taper. tramadol prn , and light exercise. Palin films ordered to rule out fracture and lytic lesions.   HTN (hypertension) Well controlled on current regimen. Renal function stable, no changes today.  B12 deficiency Managed with sublingual supplementation.  Last level significantly improved.   Osteoporosis By T scores of wrist only Nov 2013 DEXA.  Other T scores indicate mild osteopenia .  Contineu calcium,. D and wt bearing exercise.  Repeat DEXA in 2015/    Updated  Medication List Outpatient Encounter Prescriptions as of 12/31/2012  Medication Sig  . ALPRAZolam (XANAX) 0.5 MG tablet Take 1 tablet (0.5 mg total) by mouth at bedtime as needed for sleep.  Marland Kitchen aspirin 81 MG tablet Take 81 mg by mouth daily.  . calcium carbonate (OS-CAL) 600 MG TABS Take 600 mg by mouth 2 (two) times daily with a meal.  . Cholecalciferol (VITAMIN D3) 1000 UNITS CAPS Take one by mouth twice a day.  . Cyanocobalamin 2500 MCG SUBL Place 1 tablet (2,500 mcg total) under the tongue daily.  . Lutein 20 MG TABS Take one by mouth daily  . pravastatin (PRAVACHOL) 40 MG tablet Take 1 tablet (40 mg total) by mouth daily.  Marland Kitchen triamterene-hydrochlorothiazide (MAXZIDE-25) 37.5-25 MG per tablet Take 1 tablet by mouth daily.  Marland Kitchen zolpidem (AMBIEN) 10 MG tablet take 1 tablet by mouth at bedtime if needed  . cyanocobalamin 1000 MCG tablet Inject 100 mcg into the muscle once a week. Weekly x 3 weeks then monthly  . predniSONE (STERAPRED UNI-PAK) 10 MG tablet 6 tablets on Day 1 , then reduce by 1 tablet daily until gone  . Tdap (BOOSTRIX) 5-2.5-18.5 LF-MCG/0.5 injection Inject 0.5 mLs into the muscle once.  . traMADol (ULTRAM) 50 MG tablet Take 1 tablet (50 mg total) by mouth every 8 (eight) hours as needed.

## 2012-12-31 NOTE — Progress Notes (Signed)
Pre-visit discussion using our clinic review tool. No additional management support is needed unless otherwise documented below in the visit note.  

## 2012-12-31 NOTE — Assessment & Plan Note (Addendum)
16 lbs over the past year, unintentional, secondary to emotional stressors including the decline and recent loss of beloved husband Laura [name].

## 2013-01-02 ENCOUNTER — Ambulatory Visit (INDEPENDENT_AMBULATORY_CARE_PROVIDER_SITE_OTHER)
Admission: RE | Admit: 2013-01-02 | Discharge: 2013-01-02 | Disposition: A | Discharge status: Home / Self Care | Payer: Medicare PPO | Source: Ambulatory Visit | Attending: Internal Medicine | Admitting: Internal Medicine

## 2013-01-02 DIAGNOSIS — R634 Abnormal weight loss: Secondary | ICD-10-CM

## 2013-01-03 DIAGNOSIS — Z90710 Acquired absence of both cervix and uterus: Secondary | ICD-10-CM | POA: Insufficient documentation

## 2013-01-03 NOTE — Assessment & Plan Note (Signed)
By T scores of wrist only Nov 2013 DEXA.  Other T scores indicate mild osteopenia .  Contineu calcium,. D and wt bearing exercise.  Repeat DEXA in 2015/

## 2013-01-03 NOTE — Assessment & Plan Note (Signed)
Well controlled on current regimen. Renal function stable, no changes today. 

## 2013-01-03 NOTE — Assessment & Plan Note (Addendum)
SI joint arthriitis accompanied by bursitis of the right ischial tuberosity.   Prednisone taper. tramadol prn , and light exercise. Palin films ordered to rule out fracture and lytic lesions.

## 2013-01-03 NOTE — Assessment & Plan Note (Signed)
Managed with sublingual supplementation.  Last level significantly improved.

## 2013-01-04 ENCOUNTER — Encounter: Payer: Self-pay | Admitting: Internal Medicine

## 2013-01-09 ENCOUNTER — Ambulatory Visit: Payer: Medicare PPO | Admitting: Internal Medicine

## 2013-02-12 ENCOUNTER — Telehealth: Payer: Self-pay | Admitting: Internal Medicine

## 2013-02-12 NOTE — Telephone Encounter (Signed)
Patient Information:  Caller Name: [name]  Phone: [phone]  Patient: Laura [name], Laura [name]  Gender: Female  DOB: April 24, 1932  Age: [age] Years  PCP: Duncan Dullullo, Teresa (Adults only)  Office Follow Up:  Does the office need to follow up with this patient?: Yes  Instructions For The Office: callback after "strange sensation from abdomen to top of head" which resolved within a few seconds krs/can  RN Note:  Similar experience occurred in 2009 to husband, and was diagnosed with complex focal seizures.  States she felt a warmth which went up from the abdomen up to the top of the head, and lasted only a few seconds.  She [place] off the freeway and is awaiting her daughter to pick her up.  No LOC.  Afebrile.  States she felt as if something was rising from the belly to the head, and made her blink once, but no further problems since.  Speaking, smiling, moving all extremities well.  Denies current symptoms; info to office for provider review/callback.  krs/can  Symptoms  Reason For Call & Symptoms: "strange sensation from abdomen to head" during [place] drive [place] [facility]  Reviewed Health History In EMR: Yes  Reviewed Medications In EMR: Yes  Reviewed Allergies In EMR: Yes  Reviewed Surgeries / Procedures: Yes  Date of Onset of Symptoms: 02/12/2013  Guideline([name]) Used:  Neurologic Deficit  No Protocol Available - Sick Adult  Disposition Per Guideline:   Discuss with PCP and Callback by Nurse within 1 Hour  Reason For Disposition Reached:   Nursing judgment  Advice Given:  Call Back If:  Symptoms do not go away within 30 minutes  You become worse.  Patient Will Follow Care Advice:  YES

## 2013-04-14 ENCOUNTER — Encounter: Payer: Self-pay | Admitting: Internal Medicine

## 2013-04-14 ENCOUNTER — Ambulatory Visit (INDEPENDENT_AMBULATORY_CARE_PROVIDER_SITE_OTHER): Payer: Medicare PPO | Admitting: Internal Medicine

## 2013-04-14 VITALS — BP 104/66 | HR 90 | Temp 98.1°F | Resp 16 | Wt 141.5 lb

## 2013-04-14 DIAGNOSIS — Z79899 Other long term (current) drug therapy: Secondary | ICD-10-CM

## 2013-04-14 DIAGNOSIS — E039 Hypothyroidism, unspecified: Secondary | ICD-10-CM

## 2013-04-14 DIAGNOSIS — R634 Abnormal weight loss: Secondary | ICD-10-CM

## 2013-04-14 DIAGNOSIS — M810 Age-related osteoporosis without current pathological fracture: Secondary | ICD-10-CM

## 2013-04-14 DIAGNOSIS — Z1239 Encounter for other screening for malignant neoplasm of breast: Secondary | ICD-10-CM

## 2013-04-14 DIAGNOSIS — E785 Hyperlipidemia, unspecified: Secondary | ICD-10-CM

## 2013-04-14 DIAGNOSIS — F411 Generalized anxiety disorder: Secondary | ICD-10-CM

## 2013-04-14 DIAGNOSIS — G4700 Insomnia, unspecified: Secondary | ICD-10-CM

## 2013-04-14 DIAGNOSIS — M533 Sacrococcygeal disorders, not elsewhere classified: Secondary | ICD-10-CM

## 2013-04-14 DIAGNOSIS — I10 Essential (primary) hypertension: Secondary | ICD-10-CM

## 2013-04-14 MED ORDER — ALPRAZOLAM 0.5 MG PO TABS: 0.5000 mg | ORAL_TABLET | Freq: Every evening | ORAL | Status: DC | PRN

## 2013-04-14 MED ORDER — ZOLPIDEM TARTRATE 10 MG PO TABS: ORAL_TABLET | ORAL | Status: DC

## 2013-04-14 MED ORDER — CYANOCOBALAMIN 1000 MCG PO TABS: 1000.0000 ug | ORAL_TABLET | Freq: Every day | ORAL | Status: DC

## 2013-04-14 NOTE — Assessment & Plan Note (Addendum)
Prior use of alendronate for several years after using  premarin for many years post TAH/BSO in her  late 3440's. Repeat DEXA due this fall.

## 2013-04-14 NOTE — Patient Instructions (Addendum)
Stop the triamterene  Repeat a bp check  after one week  or more  Resume medication if your bp is > 140/80   We will repeat your DEXA scan toward the end of the year ,after your physical

## 2013-04-14 NOTE — Progress Notes (Signed)
Pre-visit discussion using our clinic review tool. No additional management support is needed unless otherwise documented below in the visit note.  

## 2013-04-15 ENCOUNTER — Telehealth: Payer: Self-pay | Admitting: Internal Medicine

## 2013-04-15 ENCOUNTER — Other Ambulatory Visit (INDEPENDENT_AMBULATORY_CARE_PROVIDER_SITE_OTHER): Payer: Medicare PPO

## 2013-04-15 ENCOUNTER — Encounter: Payer: Self-pay | Admitting: Emergency Medicine

## 2013-04-15 DIAGNOSIS — Z79899 Other long term (current) drug therapy: Secondary | ICD-10-CM

## 2013-04-15 DIAGNOSIS — E039 Hypothyroidism, unspecified: Secondary | ICD-10-CM

## 2013-04-15 DIAGNOSIS — E785 Hyperlipidemia, unspecified: Secondary | ICD-10-CM

## 2013-04-15 LAB — TSH: TSH: 0.61 u[IU]/mL (ref 0.35–5.50)

## 2013-04-15 LAB — COMPREHENSIVE METABOLIC PANEL
ALBUMIN: 4.2 g/dL (ref 3.5–5.2)
ALK PHOS: 47 U/L (ref 39–117)
ALT: 16 U/L (ref 0–35)
AST: 18 U/L (ref 0–37)
BUN: 15 mg/dL (ref 6–23)
CO2: 28 mEq/L (ref 19–32)
CREATININE: 0.7 mg/dL (ref 0.4–1.2)
Calcium: 9.4 mg/dL (ref 8.4–10.5)
Chloride: 104 mEq/L (ref 96–112)
GFR: 90.38 mL/min (ref >60.00)
GLUCOSE: 92 mg/dL (ref 70–99)
POTASSIUM: 4 meq/L (ref 3.5–5.1)
Sodium: 138 mEq/L (ref 135–145)
Total Bilirubin: 0.5 mg/dL (ref 0.3–1.2)
Total Protein: 6.6 g/dL (ref 6.0–8.3)

## 2013-04-15 LAB — LIPID PANEL
CHOL/HDL RATIO: 2
Cholesterol: 200 mg/dL (ref 0–200)
HDL: 102.1 mg/dL (ref >39.00)
LDL Cholesterol: 71 mg/dL (ref 0–99)
TRIGLYCERIDES: 133 mg/dL (ref 0.0–149.0)
VLDL: 26.6 mg/dL (ref 0.0–40.0)

## 2013-04-15 MED ORDER — CYANOCOBALAMIN 2500 MCG SL SUBL: 1.0000 | SUBLINGUAL_TABLET | Freq: Every day | SUBLINGUAL | Status: DC

## 2013-04-15 NOTE — Telephone Encounter (Signed)
Please advise which you would like patient on I will fix.

## 2013-04-15 NOTE — Telephone Encounter (Signed)
Pt came back for labs and stated her pharmacy was having problems with rx sent in The one she has been taking is sublingual cyancolalamin 2500 mcg  The rx was fro cyanocobalmin tablet  Please advise pharmcy which is correct [facility]  [address]

## 2013-04-15 NOTE — Telephone Encounter (Signed)
Sublingual is the best form

## 2013-04-15 NOTE — Telephone Encounter (Signed)
Patient notified sent to pharmacy 2500 mcg

## 2013-04-16 ENCOUNTER — Other Ambulatory Visit: Payer: Self-pay | Admitting: Internal Medicine

## 2013-04-16 ENCOUNTER — Encounter: Payer: Self-pay | Admitting: Internal Medicine

## 2013-04-16 LAB — VITAMIN D 25 HYDROXY (VIT D DEFICIENCY, FRACTURES): Vit D, 25-Hydroxy: 57 ng/mL (ref 30–89)

## 2013-04-18 NOTE — Assessment & Plan Note (Signed)
Now resolved with prn us of NSAIDs,  Plain films normal.

## 2013-04-18 NOTE — Progress Notes (Signed)
Patient ID: Laura SilenceBettie [name], female   DOB: Dec 30, 1932, 80 y.o.   MRN: [id number]   Patient Active Problem List   Diagnosis Date Noted  . S/P hysterectomy with oophorectomy 01/03/2013  . Grief 10/19/2012  . HTN (hypertension) 10/19/2012  . Loss of weight 10/17/2012  . B12 deficiency 04/16/2012  . Anxiety state, unspecified 04/16/2012  . Neuropathy of both feet 09/18/2011  . Hyperlipidemia LDL goal < 130 03/21/2011  . Sacroiliac pain 03/21/2011  . Insomnia 03/21/2011  . Osteoporosis     Subjective:  CC:   Chief Complaint  Patient presents with  . Follow-up    On medications    HPI:   Laura [name] is a 80 y.o. female who presents for  Follow up on chronic  Issues including hypertension, sacroiliitis, grief and weght loss.  She is feeling better. Blood pressures have improved ,  She is sleeping better, and weight loss has continued but slowed down.      Past Medical History  Diagnosis Date  . Osteoporosis     Past Surgical History  Procedure Laterality Date  . Tubal ligation      at age 80, vaginal complicated by peritonitis  . Abdominal hysterectomy      secondary to peritonitis and complications        The following portions of the patient's history were reviewed and updated as appropriate: Allergies, current medications, and problem list.    Review of Systems:   Patient denies headache, fevers, malaise, unintentional weight loss, skin rash, eye pain, sinus congestion and sinus pain, sore throat, dysphagia,  hemoptysis , cough, dyspnea, wheezing, chest pain, palpitations, orthopnea, edema, abdominal pain, nausea, melena, diarrhea, constipation, flank pain, dysuria, hematuria, urinary  Frequency, nocturia, numbness, tingling, seizures,  Focal weakness, Loss of consciousness,  Tremor, insomnia, depression, anxiety, and suicidal ideation.     History   Social History  . Marital Status: Married    Spouse Name: N/A    Number of Children: N/A  . Years of  Education: N/A   Occupational History  . Not on file.   Social History Main Topics  . Smoking status: Former Smoker    Quit date: 03/20/1981  . Smokeless tobacco: Never Used  . Alcohol Use: 8.4 oz/week    14 Glasses of wine per week  . Drug Use: No  . Sexual Activity: Not on file   Other Topics Concern  . Not on file   Social History Narrative  . No narrative on file    Objective:  Filed Vitals:   04/14/13 1224  BP: 104/66  Pulse: 90  Temp: 98.1 F (36.7 C)  Resp: 16     General appearance: alert, cooperative and appears stated age Ears: normal TM's and external ear canals both ears Throat: lips, mucosa, and tongue normal; teeth and gums normal Neck: no adenopathy, no carotid bruit, supple, symmetrical, trachea midline and thyroid not enlarged, symmetric, no tenderness/mass/nodules Back: symmetric, no curvature. ROM normal. No CVA tenderness. Lungs: clear to auscultation bilaterally Heart: regular rate and rhythm, S1, S2 normal, no murmur, click, rub or gallop Abdomen: soft, non-tender; bowel sounds normal; no masses,  no organomegaly Pulses: 2+ and symmetric Skin: Skin color, texture, turgor normal. No rashes or lesions Lymph nodes: Cervical, supraclavicular, and axillary nodes normal.  Assessment and Plan:  Osteoporosis Prior use of alendronate for several years after using  premarin for many years post TAH/BSO in her  late 2840's. Repeat DEXA due this fall.   HTN (hypertension) bp  has imporved sine she has started exercisinsg and is now < 110 .  Will stop the maxzide.   Loss of weight Presumed Secondary to anxiety caused by emotional stressors including the recent loss of her husband .  Weight loss of 18 lbs since August 10`4 noted.  Thyroid function and protein stores are normal, but if the weight loss continues will need to work up for malignancy.   Anxiety state, unspecified She has available Low dose alprazolam for prn use but has not used it  Coping with  anxiety by exercising and increasing her social contacts.    Insomnia Managed previously with Remus Loffler , now trying to wean herself off of it.   Sacroiliac pain Now resolved with prn [place] of NSAIDs,  Plain films normal.     A total of was spent with patient more than half of which was spent in counseling, reviewing records from other prviders and coordination of care.   Updated Medication List Outpatient Encounter Prescriptions as of 04/14/2013  Medication Sig  . ALPRAZolam (XANAX) 0.5 MG tablet Take 1 tablet (0.5 mg total) by mouth at bedtime as needed for sleep.  Marland Kitchen aspirin 81 MG tablet Take 81 mg by mouth daily.  . calcium carbonate (OS-CAL) 600 MG TABS Take 600 mg by mouth 2 (two) times daily with a meal.  . Cholecalciferol (VITAMIN D3) 1000 UNITS CAPS Take one by mouth twice a day.  . Lutein 20 MG TABS Take one by mouth daily  . pravastatin (PRAVACHOL) 40 MG tablet Take 1 tablet (40 mg total) by mouth daily.  . Tdap (BOOSTRIX) 5-2.5-18.5 LF-MCG/0.5 injection Inject 0.5 mLs into the muscle once.  . triamterene-hydrochlorothiazide (MAXZIDE-25) 37.5-25 MG per tablet Take 1 tablet by mouth daily.  Marland Kitchen zolpidem (AMBIEN) 10 MG tablet take 1 tablet by mouth at bedtime if needed  . [DISCONTINUED] ALPRAZolam (XANAX) 0.5 MG tablet Take 1 tablet (0.5 mg total) by mouth at bedtime as needed for sleep.  . [DISCONTINUED] cyanocobalamin 1000 MCG tablet Inject 100 mcg into the muscle once a week. Weekly x 3 weeks then monthly  . [DISCONTINUED] cyanocobalamin 1000 MCG tablet Take 1 tablet (1,000 mcg total) by mouth daily. Weekly x 3 weeks then monthly  . [DISCONTINUED] Cyanocobalamin 2500 MCG SUBL Place 1 tablet (2,500 mcg total) under the tongue daily.  . [DISCONTINUED] zolpidem (AMBIEN) 10 MG tablet take 1 tablet by mouth at bedtime if needed  . predniSONE (STERAPRED UNI-PAK) 10 MG tablet 6 tablets on Day 1 , then reduce by 1 tablet daily until gone  . traMADol (ULTRAM) 50 MG tablet Take 1  tablet (50 mg total) by mouth every 8 (eight) hours as needed.     Orders Placed This Encounter  Procedures  . MM DIGITAL SCREENING BILATERAL  . Comprehensive metabolic panel  . Lipid panel  . TSH  . Vit D  25 hydroxy (rtn osteoporosis monitoring)    No Follow-up on file.

## 2013-04-18 NOTE — Assessment & Plan Note (Addendum)
Presumed Secondary to anxiety caused by emotional stressors including the recent loss of her husband .  Weight loss of 18 lbs since August 10`4 noted.  Thyroid function and protein stores are normal, but if the weight loss continues will need to work up for malignancy.

## 2013-04-18 NOTE — Assessment & Plan Note (Signed)
Managed previously with Remus Lofflerambien , now trying to wean herself off of it.

## 2013-04-18 NOTE — Assessment & Plan Note (Signed)
bp has imporved sine she has started exercisinsg and is now < 110 .  Will stop the maxzide.

## 2013-04-18 NOTE — Assessment & Plan Note (Signed)
She has available Low dose alprazolam for prn use but has not used it  Coping with anxiety by exercising and increasing her social contacts.

## 2013-04-29 ENCOUNTER — Ambulatory Visit: Payer: Self-pay | Admitting: Internal Medicine

## 2013-04-29 LAB — HM MAMMOGRAPHY: HM Mammogram: NORMAL

## 2013-05-15 ENCOUNTER — Telehealth: Payer: Self-pay | Admitting: Internal Medicine

## 2013-05-15 MED ORDER — ZOLPIDEM TARTRATE 10 MG PO TABS: ORAL_TABLET | ORAL | Status: DC

## 2013-05-15 NOTE — Telephone Encounter (Signed)
Would like to continue on Ambien and is willing to pay out of pocket.

## 2013-06-10 ENCOUNTER — Encounter: Payer: Self-pay | Admitting: Internal Medicine

## 2013-10-16 ENCOUNTER — Encounter: Payer: Self-pay | Admitting: Family Medicine

## 2013-10-16 ENCOUNTER — Ambulatory Visit (INDEPENDENT_AMBULATORY_CARE_PROVIDER_SITE_OTHER)
Admission: RE | Admit: 2013-10-16 | Discharge: 2013-10-16 | Disposition: A | Discharge status: Home / Self Care | Payer: Medicare PPO | Source: Ambulatory Visit | Attending: Family Medicine | Admitting: Family Medicine

## 2013-10-16 ENCOUNTER — Ambulatory Visit (INDEPENDENT_AMBULATORY_CARE_PROVIDER_SITE_OTHER): Payer: Medicare PPO | Admitting: Family Medicine

## 2013-10-16 VITALS — BP 118/80 | HR 67 | Temp 98.2°F | Ht 64.0 in | Wt 143.2 lb

## 2013-10-16 DIAGNOSIS — M533 Sacrococcygeal disorders, not elsewhere classified: Secondary | ICD-10-CM

## 2013-10-16 DIAGNOSIS — M810 Age-related osteoporosis without current pathological fracture: Secondary | ICD-10-CM

## 2013-10-16 DIAGNOSIS — W19XXXA Unspecified fall, initial encounter: Secondary | ICD-10-CM

## 2013-10-16 DIAGNOSIS — M542 Cervicalgia: Secondary | ICD-10-CM | POA: Insufficient documentation

## 2013-10-16 HISTORY — DX: Unspecified fall, initial encounter: W19.XXXA

## 2013-10-16 NOTE — Assessment & Plan Note (Signed)
Nml neuro exam today, no neuro symptoms, no headache. No clear concussion or concern for intracranial injury.  Reviewed reasons for pt to call back with pt.

## 2013-10-16 NOTE — Assessment & Plan Note (Signed)
Age, vertebral pain and osteoporosis.  Send for X-ray.  Likely MSK strain. If neg X-ray treat with NSAID, gentle stretching and ice.

## 2013-10-16 NOTE — Progress Notes (Signed)
Subjective:    Patient ID: Laura [name], female    DOB: Feb 04, 1932, 80 y.o.   MRN: [id number]  Fall  Neck Pain    80 year old female pt of Dr. Darrick Huntsman with history of osteoporosis presents following and accidental fall yesterday. She landed on her but and her back,neck. She lost balance when she backed up, knocked chair over.  She has pain in tailbone and in upper thoracic spine and lower cervical spine.  She hit her head, but is not sore now. NO LOC. She had one of her migraine auras after hitting her head, lastred 10 min. Now new neurologic changes.  Has scratch on right arm.   Now with pain   She using advil 400 mg   She is usually very active with zumba and exercsie   Review of Systems  Musculoskeletal: Positive for neck pain.       Objective:   Physical Exam  Constitutional: She is oriented to person, place, and time. Vital signs are normal. She appears well-developed and well-nourished. She is cooperative.  Non-toxic appearance. She does not appear ill. No distress.  HENT:  Head: Normocephalic.  Right Ear: Hearing, tympanic membrane, external ear and ear canal normal. Tympanic membrane is not erythematous, not retracted and not bulging.  Left Ear: Hearing, tympanic membrane, external ear and ear canal normal. Tympanic membrane is not erythematous, not retracted and not bulging.  Nose: No mucosal edema or rhinorrhea. Right sinus exhibits no maxillary sinus tenderness and no frontal sinus tenderness. Left sinus exhibits no maxillary sinus tenderness and no frontal sinus tenderness.  Mouth/Throat: Uvula is midline, oropharynx is clear and moist and mucous membranes are normal.  Eyes: Conjunctivae, EOM and lids are normal. Pupils are equal, round, and reactive to light. Lids are everted and swept, no foreign bodies found.  Neck: Trachea normal and normal range of motion. Neck supple. Carotid bruit is not present. No mass and no thyromegaly present.  Cardiovascular:  Normal rate, regular rhythm, S1 normal, S2 normal, normal heart sounds, intact distal pulses and normal pulses.  Exam reveals no gallop and no friction rub.   No murmur heard. Pulmonary/Chest: Effort normal and breath sounds normal. Not tachypneic. No respiratory distress. She has no decreased breath sounds. She has no wheezes. She has no rhonchi. She has no rales.  Abdominal: Soft. Normal appearance and bowel sounds are normal. There is no tenderness.  Musculoskeletal:       Cervical back: She exhibits tenderness and bony tenderness. She exhibits normal range of motion and no swelling.       Lumbar back: She exhibits tenderness and bony tenderness. She exhibits normal range of motion.  ttp over coccyx  neg spurling  Neurological: She is alert and oriented to person, place, and time. She has normal strength and normal reflexes. No cranial nerve deficit or sensory deficit. She exhibits normal muscle tone. She displays a negative Romberg sign. Coordination and gait normal. GCS eye subscore is 4. GCS verbal subscore is 5. GCS motor subscore is 6.  Nml cerebellar exam   No papilledema  Skin: Skin is warm, dry and intact. No rash noted.  Psychiatric: She has a normal mood and affect. Her speech is normal and behavior is normal. Judgment and thought content normal. Her mood appears not anxious. Cognition and memory are normal. Cognition and memory are not impaired. She does not exhibit a depressed mood. She exhibits normal recent memory and normal remote memory.  Assessment & Plan:

## 2013-10-16 NOTE — Progress Notes (Signed)
Pre visit review using our clinic review tool, if applicable. No additional management support is needed unless otherwise documented below in the visit note. 

## 2013-10-16 NOTE — Assessment & Plan Note (Signed)
Given age and osteo[porosis, will X-ray.  Use donut cushion, NSAIDs to treat.

## 2013-10-16 NOTE — Patient Instructions (Signed)
We will call with x-ray results. Ibuprofen 600 every 6-8 hours for pain. Gentle upper body stretching . Can ice area. Use do-nut cushion.  Call if any new neurologic symptoms.

## 2013-10-30 ENCOUNTER — Ambulatory Visit (INDEPENDENT_AMBULATORY_CARE_PROVIDER_SITE_OTHER): Payer: Medicare PPO | Admitting: Internal Medicine

## 2013-10-30 ENCOUNTER — Encounter: Payer: Self-pay | Admitting: Internal Medicine

## 2013-10-30 VITALS — BP 118/70 | HR 81 | Temp 98.8°F | Resp 14 | Ht 64.0 in | Wt 140.8 lb

## 2013-10-30 DIAGNOSIS — Z0000 Encounter for general adult medical examination without abnormal findings: Secondary | ICD-10-CM

## 2013-10-30 DIAGNOSIS — Z79899 Other long term (current) drug therapy: Secondary | ICD-10-CM

## 2013-10-30 DIAGNOSIS — E785 Hyperlipidemia, unspecified: Secondary | ICD-10-CM

## 2013-10-30 DIAGNOSIS — Z23 Encounter for immunization: Secondary | ICD-10-CM

## 2013-10-30 DIAGNOSIS — F411 Generalized anxiety disorder: Secondary | ICD-10-CM

## 2013-10-30 DIAGNOSIS — I10 Essential (primary) hypertension: Secondary | ICD-10-CM

## 2013-10-30 DIAGNOSIS — M810 Age-related osteoporosis without current pathological fracture: Secondary | ICD-10-CM

## 2013-10-30 LAB — COMPREHENSIVE METABOLIC PANEL
ALBUMIN: 4.4 g/dL (ref 3.5–5.2)
ALK PHOS: 100 U/L (ref 39–117)
ALT: 14 U/L (ref 0–35)
AST: 17 U/L (ref 0–37)
BILIRUBIN TOTAL: 0.6 mg/dL (ref 0.2–1.2)
BUN: 16 mg/dL (ref 6–23)
CO2: 30 mEq/L (ref 19–32)
CREATININE: 0.6 mg/dL (ref 0.4–1.2)
Calcium: 10.2 mg/dL (ref 8.4–10.5)
Chloride: 101 mEq/L (ref 96–112)
GFR: 104.51 mL/min (ref >60.00)
GLUCOSE: 108 mg/dL — AB (ref 70–99)
POTASSIUM: 3.7 meq/L (ref 3.5–5.1)
Sodium: 138 mEq/L (ref 135–145)
Total Protein: 7.2 g/dL (ref 6.0–8.3)

## 2013-10-30 MED ORDER — CYANOCOBALAMIN 2500 MCG SL SUBL: 1.0000 | SUBLINGUAL_TABLET | Freq: Every day | SUBLINGUAL | Status: DC

## 2013-10-30 MED ORDER — ZOLPIDEM TARTRATE 10 MG PO TABS: ORAL_TABLET | ORAL | Status: DC

## 2013-10-30 MED ORDER — TRIAMTERENE-HCTZ 37.5-25 MG PO TABS: 1.0000 | ORAL_TABLET | Freq: Every day | ORAL | Status: DC

## 2013-10-30 MED ORDER — PRAVASTATIN SODIUM 40 MG PO TABS: 40.0000 mg | ORAL_TABLET | Freq: Every day | ORAL | Status: DC

## 2013-10-30 NOTE — Patient Instructions (Signed)
You had your annual  wellness exam today.    We will schedule your mammogram in May  You received the influrnza and teh Prevnar vaccine today.  If you develop a sore arm , use tylenol and ibuprofen for a few days   Please make an appt for fasting labs and follow up in March   We will contact you with the bloodwork results  Health Maintenance Adopting a healthy lifestyle and getting preventive care can go a long way to promote health and wellness. Talk with your health care provider about what schedule of regular examinations is right for you. This is a good chance for you to check in with your provider about disease prevention and staying healthy. In between checkups, there are plenty of things you can do on your own. Experts have done a lot of research about which lifestyle changes and preventive measures are most likely to keep you healthy. Ask your health care provider for more information. WEIGHT AND DIET  Eat a healthy diet  Be sure to include plenty of vegetables, fruits, low-fat dairy products, and lean protein.  Do not eat a lot of foods high in solid fats, added sugars, or salt.  Get regular exercise. This is one of the most important things you can do for your health.  Most adults should exercise for at least 150 minutes each week. The exercise should increase your heart rate and make you sweat (moderate-intensity exercise).  Most adults should also do strengthening exercises at least twice a week. This is in addition to the moderate-intensity exercise.  Maintain a healthy weight  Body mass index (BMI) is a measurement that can be used to identify possible weight problems. It estimates body fat based on height and weight. Your health care provider can help determine your BMI and help you achieve or maintain a healthy weight.  For females 16 years of age and older:   A BMI below 18.5 is considered underweight.  A BMI of 18.5 to 24.9 is normal.  A BMI of 25 to 29.9 is  considered overweight.  A BMI of 30 and above is considered obese.  Watch levels of cholesterol and blood lipids  You should start having your blood tested for lipids and cholesterol at 78 years of age, then have this test every 5 years.  You may need to have your cholesterol levels checked more often if:  Your lipid or cholesterol levels are high.  You are older than 78 years of age.  You are at high risk for heart disease.  CANCER SCREENING   Lung Cancer  Lung cancer screening is recommended for adults 50-44 years old who are at high risk for lung cancer because of a history of smoking.  A yearly low-dose CT scan of the lungs is recommended for people who:  Currently smoke.  Have quit within the past 15 years.  Have at least a 30-pack-year history of smoking. A pack year is smoking an average of one pack of cigarettes a day for 1 year.  Yearly screening should continue until it has been 15 years since you quit.  Yearly screening should stop if you develop a health problem that would prevent you from having lung cancer treatment.  Breast Cancer  Practice breast self-awareness. This means understanding how your breasts normally appear and feel.  It also means doing regular breast self-exams. Let your health care provider know about any changes, no matter how small.  If you are in your 72s  or 59s, you should have a clinical breast exam (CBE) by a health care provider every 1-3 years as part of a regular health exam.  If you are 5 or older, have a CBE every year. Also consider having a breast X-ray (mammogram) every year.  If you have a family history of breast cancer, talk to your health care provider about genetic screening.  If you are at high risk for breast cancer, talk to your health care provider about having an MRI and a mammogram every year.  Breast cancer gene (BRCA) assessment is recommended for women who have family members with BRCA-related cancers.  BRCA-related cancers include:  Breast.  Ovarian.  Tubal.  Peritoneal cancers.  Results of the assessment will determine the need for genetic counseling and BRCA1 and BRCA2 testing. Cervical Cancer Routine pelvic examinations to screen for cervical cancer are no longer recommended for nonpregnant women who are considered low risk for cancer of the pelvic organs (ovaries, uterus, and vagina) and who do not have symptoms. A pelvic examination may be necessary if you have symptoms including those associated with pelvic infections. Ask your health care provider if a screening pelvic exam is right for you.   The Pap test is the screening test for cervical cancer for women who are considered at risk.  If you had a hysterectomy for a problem that was not cancer or a condition that could lead to cancer, then you no longer need Pap tests.  If you are older than 65 years, and you have had normal Pap tests for the past 10 years, you no longer need to have Pap tests.  If you have had past treatment for cervical cancer or a condition that could lead to cancer, you need Pap tests and screening for cancer for at least 20 years after your treatment.  If you no longer get a Pap test, assess your risk factors if they change (such as having a new sexual partner). This can affect whether you should start being screened again.  Some women have medical problems that increase their chance of getting cervical cancer. If this is the case for you, your health care provider may recommend more frequent screening and Pap tests.  The human papillomavirus (HPV) test is another test that may be used for cervical cancer screening. The HPV test looks for the virus that can cause cell changes in the cervix. The cells collected during the Pap test can be tested for HPV.  The HPV test can be used to screen women 28 years of age and older. Getting tested for HPV can extend the interval between normal Pap tests from three to  five years.  An HPV test also should be used to screen women of any age who have unclear Pap test results.  After 78 years of age, women should have HPV testing as often as Pap tests.  Colorectal Cancer  This type of cancer can be detected and often prevented.  Routine colorectal cancer screening usually begins at 79 years of age and continues through 78 years of age.  Your health care provider may recommend screening at an earlier age if you have risk factors for colon cancer.  Your health care provider may also recommend using home test kits to check for hidden blood in the stool.  A small camera at the end of a tube can be used to examine your colon directly (sigmoidoscopy or colonoscopy). This is done to check for the earliest forms of colorectal cancer.  Routine screening usually begins at age 78.  Direct examination of the colon should be repeated every 5-10 years through 78 years of age. However, you may need to be screened more often if early forms of precancerous polyps or small growths are found. Skin Cancer  Check your skin from head to toe regularly.  Tell your health care provider about any new moles or changes in moles, especially if there is a change in a mole's shape or color.  Also tell your health care provider if you have a mole that is larger than the size of a pencil eraser.  Always use sunscreen. Apply sunscreen liberally and repeatedly throughout the day.  Protect yourself by wearing long sleeves, pants, a wide-brimmed hat, and sunglasses whenever you are outside. HEART DISEASE, DIABETES, AND HIGH BLOOD PRESSURE   Have your blood pressure checked at least every 1-2 years. High blood pressure causes heart disease and increases the risk of stroke.  If you are between 71 years and 67 years old, ask your health care provider if you should take aspirin to prevent strokes.  Have regular diabetes screenings. This involves taking a blood sample to check your  fasting blood sugar level.  If you are at a normal weight and have a low risk for diabetes, have this test once every three years after 78 years of age.  If you are overweight and have a high risk for diabetes, consider being tested at a younger age or more often. PREVENTING INFECTION  Hepatitis B  If you have a higher risk for hepatitis B, you should be screened for this virus. You are considered at high risk for hepatitis B if:  You were born in a country where hepatitis B is common. Ask your health care provider which countries are considered high risk.  Your parents were born in a high-risk country, and you have not been immunized against hepatitis B (hepatitis B vaccine).  You have HIV or AIDS.  You use needles to inject street drugs.  You live with someone who has hepatitis B.  You have had sex with someone who has hepatitis B.  You get hemodialysis treatment.  You take certain medicines for conditions, including cancer, organ transplantation, and autoimmune conditions. Hepatitis C  Blood testing is recommended for:  Everyone born from [birth year] through 1963.  Anyone with known risk factors for hepatitis C. Sexually transmitted infections (STIs)  You should be screened for sexually transmitted infections (STIs) including gonorrhea and chlamydia if:  You are sexually active and are younger than 78 years of age.  You are older than 78 years of age and your health care provider tells you that you are at risk for this type of infection.  Your sexual activity has changed since you were last screened and you are at an increased risk for chlamydia or gonorrhea. Ask your health care provider if you are at risk.  If you do not have HIV, but are at risk, it may be recommended that you take a prescription medicine daily to prevent HIV infection. This is called pre-exposure prophylaxis (PrEP). You are considered at risk if:  You are sexually active and do not regularly use condoms or  know the HIV status of your partner(s).  You take drugs by injection.  You are sexually active with a partner who has HIV. Talk with your health care provider about whether you are at high risk of being infected with HIV. If you choose to begin PrEP, you should first  be tested for HIV. You should then be tested every 3 months for as long as you are taking PrEP.  PREGNANCY   If you are premenopausal and you may become pregnant, ask your health care provider about preconception counseling.  If you may become pregnant, take 400 to 800 micrograms (mcg) of folic acid every day.  If you want to prevent pregnancy, talk to your health care provider about birth control (contraception). OSTEOPOROSIS AND MENOPAUSE   Osteoporosis is a disease in which the bones lose minerals and strength with aging. This can result in serious bone fractures. Your risk for osteoporosis can be identified using a bone density scan.  If you are 99 years of age or older, or if you are at risk for osteoporosis and fractures, ask your health care provider if you should be screened.  Ask your health care provider whether you should take a calcium or vitamin D supplement to lower your risk for osteoporosis.  Menopause may have certain physical symptoms and risks.  Hormone replacement therapy may reduce some of these symptoms and risks. Talk to your health care provider about whether hormone replacement therapy is right for you.  HOME CARE INSTRUCTIONS   Schedule regular health, dental, and eye exams.  Stay current with your immunizations.   Do not use any tobacco products including cigarettes, chewing tobacco, or electronic cigarettes.  If you are pregnant, do not drink alcohol.  If you are breastfeeding, limit how much and how often you drink alcohol.  Limit alcohol intake to no more than 1 drink per day for nonpregnant women. One drink equals 12 ounces of beer, 5 ounces of wine, or 1 ounces of hard liquor.  Do  not use street drugs.  Do not share needles.  Ask your health care provider for help if you need support or information about quitting drugs.  Tell your health care provider if you often feel depressed.  Tell your health care provider if you have ever been abused or do not feel safe at home. Document Released: 07/31/2010 Document Revised: 06/01/2013 Document Reviewed: 12/17/2012 [facility] Patient Information 2015 [place], [place]. This information is not intended to replace advice given to you by your health care provider. Make sure you discuss any questions you have with your health care provider.

## 2013-10-30 NOTE — Progress Notes (Signed)
Patient ID: Laura [name], female   DOB: Jun 12, 1932, 80 y.o.   MRN: [id number]  The patient is here for annual Medicare wellness examination and management of other chronic and acute problems.   The risk factors are reflected in the social history.  She is recently widowed and has been adjusting to life alone.    The roster of all physicians providing medical care to patient - is listed in the Snapshot section of the chart.  Activities of daily living:  The patient is 100% independent in all ADLs: dressing, toileting, feeding as well as independent mobility  Home safety : The patient has smoke detectors in the home. They wear seatbelts.  There are no firearms at home. There is no violence in the home.   There is no risks for hepatitis, STDs or HIV. There is no   history of blood transfusion. They have no travel history to infectious disease endemic areas of the world.  The patient has seen their dentist in the last six month. They have seen their eye doctor in the last year. They admit to slight hearing difficulty with regard to whispered voices and some television programs.  They have deferred audiologic testing in the last year.  They do not  have excessive sun exposure. Discussed the need for sun protection: hats, long sleeves and use of sunscreen if there is significant sun exposure.   Diet: the importance of a healthy diet is discussed. They do have a healthy diet.  The benefits of regular aerobic exercise were discussed. She walks 4 times per week ,  20 minutes.   Depression screen: there are no signs or vegative symptoms of depression- irritability, change in appetite, anhedonia, sadness/tearfullness.  Cognitive assessment: the patient manages all their financial and personal affairs and is actively engaged. They could relate day,date,year and events; recalled 2/3 objects at 3 minutes; performed clock-face test normally.  The following portions of the patient's history were reviewed  and updated as appropriate: allergies, current medications, past family history, past medical history,  past surgical history, past social history  and problem list.  Visual acuity was not assessed per patient preference since she has regular follow up with her ophthalmologist. Hearing and body mass index were assessed and reviewed.   During the course of the visit the patient was educated and counseled about appropriate screening and preventive services including : fall prevention , diabetes screening, nutrition counseling, colorectal cancer screening, and recommended immunizations.    Objective:  BP 118/70  Pulse 81  Temp(Src) 98.8 F (37.1 C) (Oral)  Resp 14  Ht 5\' 4"  (1.626 m)  Wt 140 lb 12 oz (63.844 kg)  BMI 24.15 kg/m2  SpO2 97%  General appearance: alert, cooperative and appears stated age Head: Normocephalic, without obvious abnormality, atraumatic Eyes: conjunctivae/corneas clear. PERRL, EOM's intact. Fundi benign. Ears: normal TM's and external ear canals both ears Nose: Nares normal. Septum midline. Mucosa normal. No drainage or sinus tenderness. Throat: lips, mucosa, and tongue normal; teeth and gums normal Neck: no adenopathy, no carotid bruit, no JVD, supple, symmetrical, trachea midline and thyroid not enlarged, symmetric, no tenderness/mass/nodules Lungs: clear to auscultation bilaterally Breasts: normal appearance, no masses or tenderness Heart: regular rate and rhythm, S1, S2 normal, no murmur, click, rub or gallop Abdomen: soft, non-tender; bowel sounds normal; no masses,  no organomegaly Extremities: extremities normal, atraumatic, no cyanosis or edema Pulses: 2+ and symmetric Skin: Skin color, texture, turgor normal. No rashes or lesions Neurologic: Alert and oriented  X 3, normal strength and tone. Normal symmetric reflexes. Normal coordination and gait.   Assessment and Plan:  HTN (hypertension) Well controlled on current regimen. Renal function stable, no  changes today.  Lab Results  Component Value Date   CREATININE 0.6 10/30/2013   Lab Results  Component Value Date   NA 138 10/30/2013   K 3.7 10/30/2013   CL 101 10/30/2013   CO2 30 10/30/2013     Hyperlipidemia with target LDL less than 130 LDL and triglycerides are at goal on current medications. se has no side effects and liver enzymes are normal. No changes today.  Lab Results  Component Value Date   CHOL 200 04/15/2013   HDL 102.10 04/15/2013   LDLCALC 71 04/15/2013   LDLDIRECT 109.7 09/27/2011   TRIG 133.0 04/15/2013   CHOLHDL 2 04/15/2013   Lab Results  Component Value Date   ALT 14 10/30/2013   AST 17 10/30/2013   ALKPHOS 100 10/30/2013   BILITOT 0.6 10/30/2013      Anxiety state She has available Low dose alprazolam for prn use but has not used it but rarely  She is coping with anxiety by exercising and increasing her social contacts.     Encounter for Medicare annual wellness exam Annual Medicare wellness  exam was done as well as a comprehensive physical exam and management of acute and chronic conditions .  During the course of the visit the patient was educated and counseled about appropriate screening and preventive services including : fall prevention , diabetes screening, nutrition counseling, colorectal cancer screening, and recommended immunizations.  Printed recommendations for health maintenance screenings was given.    Updated Medication List Outpatient Encounter Prescriptions as of 10/30/2013  Medication Sig  . ALPRAZolam (XANAX) 0.5 MG tablet Take 1 tablet (0.5 mg total) by mouth at bedtime as needed for sleep.  Marland Kitchen. aspirin 81 MG tablet Take 81 mg by mouth daily.  . calcium carbonate (OS-CAL) 600 MG TABS Take 600 mg by mouth 2 (two) times daily with a meal.  . Cholecalciferol (VITAMIN D3) 1000 UNITS CAPS Take one by mouth twice a day.  . Cyanocobalamin 2500 MCG SUBL Place 1 tablet (2,500 mcg total) under the tongue daily.  . Lutein 20 MG TABS Take one by  mouth daily  . pravastatin (PRAVACHOL) 40 MG tablet Take 1 tablet (40 mg total) by mouth daily.  . Tdap (BOOSTRIX) 5-2.5-18.5 LF-MCG/0.5 injection Inject 0.5 mLs into the muscle once.  . triamterene-hydrochlorothiazide (MAXZIDE-25) 37.5-25 MG per tablet Take 1 tablet by mouth daily.  Marland Kitchen. zolpidem (AMBIEN) 10 MG tablet take 1 tablet by mouth at bedtime if needed  . [DISCONTINUED] Cyanocobalamin 2500 MCG SUBL Place 1 tablet (2,500 mcg total) under the tongue daily.  . [DISCONTINUED] pravastatin (PRAVACHOL) 40 MG tablet Take 1 tablet (40 mg total) by mouth daily.  . [DISCONTINUED] triamterene-hydrochlorothiazide (MAXZIDE-25) 37.5-25 MG per tablet Take 1 tablet by mouth daily.  . [DISCONTINUED] zolpidem (AMBIEN) 10 MG tablet take 1 tablet by mouth at bedtime if needed

## 2013-10-30 NOTE — Progress Notes (Signed)
Pre-visit discussion using our clinic review tool. No additional management support is needed unless otherwise documented below in the visit note.  

## 2013-11-01 ENCOUNTER — Encounter: Payer: Self-pay | Admitting: Internal Medicine

## 2013-11-01 DIAGNOSIS — Z0000 Encounter for general adult medical examination without abnormal findings: Secondary | ICD-10-CM | POA: Insufficient documentation

## 2013-11-01 NOTE — Assessment & Plan Note (Signed)

## 2013-11-01 NOTE — Assessment & Plan Note (Signed)
Well controlled on current regimen. Renal function stable, no changes today.  Lab Results  Component Value Date   CREATININE 0.6 10/30/2013   Lab Results  Component Value Date   NA 138 10/30/2013   K 3.7 10/30/2013   CL 101 10/30/2013   CO2 30 10/30/2013

## 2013-11-01 NOTE — Assessment & Plan Note (Signed)
LDL and triglycerides are at goal on current medications. se has no side effects and liver enzymes are normal. No changes today.  Lab Results  Component Value Date   CHOL 200 04/15/2013   HDL 102.10 04/15/2013   LDLCALC 71 04/15/2013   LDLDIRECT 109.7 09/27/2011   TRIG 133.0 04/15/2013   CHOLHDL 2 04/15/2013   Lab Results  Component Value Date   ALT 14 10/30/2013   AST 17 10/30/2013   ALKPHOS 100 10/30/2013   BILITOT 0.6 10/30/2013

## 2013-11-01 NOTE — Assessment & Plan Note (Signed)
She has available Low dose alprazolam for prn use but has not used it but rarely  She is coping with anxiety by exercising and increasing her social contacts.

## 2014-01-06 ENCOUNTER — Ambulatory Visit: Payer: Self-pay | Admitting: Internal Medicine

## 2014-01-06 LAB — HM DEXA SCAN

## 2014-01-12 ENCOUNTER — Ambulatory Visit (INDEPENDENT_AMBULATORY_CARE_PROVIDER_SITE_OTHER): Payer: Medicare PPO | Admitting: Internal Medicine

## 2014-01-12 ENCOUNTER — Encounter: Payer: Self-pay | Admitting: Internal Medicine

## 2014-01-12 ENCOUNTER — Telehealth: Payer: Self-pay | Admitting: Internal Medicine

## 2014-01-12 VITALS — BP 108/68 | HR 87 | Temp 97.7°F | Resp 16 | Ht 64.0 in | Wt 142.5 lb

## 2014-01-12 DIAGNOSIS — R350 Frequency of micturition: Secondary | ICD-10-CM

## 2014-01-12 DIAGNOSIS — R300 Dysuria: Secondary | ICD-10-CM

## 2014-01-12 LAB — POCT URINALYSIS DIPSTICK
Bilirubin, UA: NEGATIVE
Blood, UA: NEGATIVE
Glucose, UA: NEGATIVE
Ketones, UA: NEGATIVE
Nitrite, UA: NEGATIVE
Protein, UA: NEGATIVE
Spec Grav, UA: 1.015
UROBILINOGEN UA: 0.2
pH, UA: 6.5

## 2014-01-12 NOTE — Telephone Encounter (Signed)
Unable to reach patient by phone left message to call office.

## 2014-01-12 NOTE — Progress Notes (Signed)
Pre-visit discussion using our clinic review tool. No additional management support is needed unless otherwise documented below in the visit note.  

## 2014-01-12 NOTE — Telephone Encounter (Signed)
Pt called saying she thinks she has a UTI. Since yesterday she's had an urgent feeling that she needs to go to the bathroom but only a trickle of urine comes out if any. From the description, she barely has time to do other things due to having the urgent feeling that she has to urinate. She's going out of town in a day or two and is wondering if she can be seen or if a Rx can be written for her. Please call the pt. Pt ph# [phone] Thank you.

## 2014-01-12 NOTE — Telephone Encounter (Signed)
Patient scheduled.

## 2014-01-12 NOTE — Progress Notes (Signed)
Patient ID: Laura [name], female   DOB: 1932/05/22, 80 y.o.   MRN: [id number]  Patient Active Problem List   Diagnosis Date Noted  . Urinary frequency 01/13/2014  . Encounter for Medicare annual wellness exam 11/01/2013  . Accidental fall 10/16/2013  . Neck pain, acute 10/16/2013  . Coccygeal pain, acute 10/16/2013  . S/P hysterectomy with oophorectomy 01/03/2013  . Grief 10/19/2012  . HTN (hypertension) 10/19/2012  . Loss of weight 10/17/2012  . B12 deficiency 04/16/2012  . Anxiety state 04/16/2012  . Neuropathy of both feet 09/18/2011  . Hyperlipidemia with target LDL less than 130 03/21/2011  . Sacroiliac pain 03/21/2011  . Insomnia 03/21/2011  . Osteoporosis     Subjective:  CC:   Chief Complaint  Patient presents with  . Urinary Tract Infection    polyuria, pressure urgency.    HPI:   Laura [name] is a 80 y.o. female who presents for  Increased urinary frequency.  Symptoms  started last night without burning ,.  Today the symptoms are not present. Not sexually active,  No recetn travel or use of douches      Past Medical History  Diagnosis Date  . Osteoporosis     Past Surgical History  Procedure Laterality Date  . Tubal ligation      at age 80, vaginal complicated by peritonitis  . Abdominal hysterectomy      secondary to peritonitis and complications        The following portions of the patient's history were reviewed and updated as appropriate: Allergies, current medications, and problem list.    Review of Systems:   Patient denies headache, fevers, malaise, unintentional weight loss, skin rash, eye pain, sinus congestion and sinus pain, sore throat, dysphagia,  hemoptysis , cough, dyspnea, wheezing, chest pain, palpitations, orthopnea, edema, abdominal pain, nausea, melena, diarrhea, constipation, flank pain, dysuria, hematuria, urinary  Frequency, nocturia, numbness, tingling, seizures,  Focal weakness, Loss of consciousness,  Tremor,  insomnia, depression, anxiety, and suicidal ideation.     History   Social History  . Marital Status: Widowed    Spouse Name: N/A    Number of Children: N/A  . Years of Education: N/A   Occupational History  . Not on file.   Social History Main Topics  . Smoking status: Former Smoker    Quit date: 03/20/1981  . Smokeless tobacco: Never Used  . Alcohol Use: 8.4 oz/week    14 Glasses of wine per week  . Drug Use: No  . Sexual Activity: Not on file   Other Topics Concern  . Not on file   Social History Narrative    Objective:  Filed Vitals:   01/12/14 1522  BP: 108/68  Pulse: 87  Temp: 97.7 F (36.5 C)  Resp: 16     General appearance: alert, cooperative and appears stated age  Back: symmetric, no curvature. ROM normal. No CVA tenderness. Lungs: clear to auscultation bilaterally Heart: regular rate and rhythm, S1, S2 normal, no murmur, click, rub or gallop Abdomen: soft, non-tender; bowel sounds normal; no masses,  no organomegaly   Assessment and Plan:  Urinary frequency Symptoms are currently resolved,  Will wait until the cultures data is available.    Updated Medication List Outpatient Encounter Prescriptions as of 01/12/2014  Medication Sig  . ALPRAZolam (XANAX) 0.5 MG tablet Take 1 tablet (0.5 mg total) by mouth at bedtime as needed for sleep.  Marland Kitchen. aspirin 81 MG tablet Take 81 mg by mouth daily.  .Marland Kitchen  calcium carbonate (OS-CAL) 600 MG TABS Take 600 mg by mouth 2 (two) times daily with a meal.  . Cholecalciferol (VITAMIN D3) 1000 UNITS CAPS Take one by mouth twice a day.  . Cyanocobalamin 2500 MCG SUBL Place 1 tablet (2,500 mcg total) under the tongue daily.  . Lutein 20 MG TABS Take one by mouth daily  . pravastatin (PRAVACHOL) 40 MG tablet Take 1 tablet (40 mg total) by mouth daily.  Marland Kitchen. triamterene-hydrochlorothiazide (MAXZIDE-25) 37.5-25 MG per tablet Take 1 tablet by mouth daily.  Marland Kitchen. zolpidem (AMBIEN) 10 MG tablet take 1 tablet by mouth at bedtime if  needed  . [DISCONTINUED] Tdap (BOOSTRIX) 5-2.5-18.5 LF-MCG/0.5 injection Inject 0.5 mLs into the muscle once.     Orders Placed This Encounter  Procedures  . Urine Culture  . Urinalysis, Routine w reflex microscopic  . POCT Urinalysis Dipstick    No Follow-up on file.

## 2014-01-13 DIAGNOSIS — R350 Frequency of micturition: Secondary | ICD-10-CM | POA: Insufficient documentation

## 2014-01-13 NOTE — Assessment & Plan Note (Signed)
Symptoms are currently resolved,  Will wait until the cultures data is available.

## 2014-01-14 ENCOUNTER — Encounter: Payer: Self-pay | Admitting: Internal Medicine

## 2014-01-14 LAB — URINE CULTURE: Colony Count: 7000

## 2014-01-25 ENCOUNTER — Telehealth: Payer: Self-pay | Admitting: Internal Medicine

## 2014-01-25 DIAGNOSIS — M810 Age-related osteoporosis without current pathological fracture: Secondary | ICD-10-CM

## 2014-01-25 NOTE — Assessment & Plan Note (Addendum)
Bone Density scores received, she has osteoporosis in the spine .T score decreased from -2.2 to -2.8 in 2 years.      I recommend treating with Prolia and  Will obtain prior authorization for Prolia since she has already completed therapy with fosomax in the past.

## 2014-01-25 NOTE — Telephone Encounter (Signed)
Sent mychart

## 2014-01-25 NOTE — Telephone Encounter (Signed)
Bone Density scores received, she has osteoporosis in the spine .    I recommend treating with medication but would need to obtain prior authorization for Prolia since she has already completed therapy with fosomax in the past.   this may take up to 3 months to get insurance to say they'll cover it before I can order it, because insurance likes to make it difficult to obtain but they eventually will.    continue  calcium  1800 mg daily (may use one 600 mg supplement, try to get rest through diet ) ,  2000 units of vitamin d and weight bearing exercise on a regular basis.

## 2014-02-02 NOTE — Telephone Encounter (Signed)
I have sent pt's info for Prolia insurance verification and will notify you once I have a response. Thank you. °

## 2014-02-04 ENCOUNTER — Telehealth: Payer: Self-pay

## 2014-02-04 NOTE — Telephone Encounter (Signed)
Patient stated she is still able to continue zumba class but right shoulder does keep patient awake with a dull ache. Patient stated she would like to see MD scheduled patient for acute on 02/15/14. Patient had [facility] and [facility]-rays made at that time but right shoulder has continued to ache especially at night. FYI

## 2014-02-04 NOTE — Telephone Encounter (Signed)
The patient called hoping to get a referral for her arm pain.  She stated she fell "a while back" and still having pain.

## 2014-02-08 NOTE — Telephone Encounter (Signed)
Mailed unread message to pt  

## 2014-02-15 ENCOUNTER — Ambulatory Visit: Payer: Medicare PPO | Admitting: Internal Medicine

## 2014-02-23 ENCOUNTER — Encounter: Payer: Self-pay | Admitting: Internal Medicine

## 2014-03-01 NOTE — Telephone Encounter (Signed)
I have rec'd pt's insurance verification for Prolia and [employer] is requiring a prior authorization.  I have completed most of the P/A form, but there are some sections that Dr. Darrick Huntsmanullo will have to complete and sign.  I have faxed the form to your attn.  Once complete, it can be returned to me at [phone]. Thank you.

## 2014-03-01 NOTE — Telephone Encounter (Signed)
In red folder. 

## 2014-03-02 ENCOUNTER — Telehealth: Payer: Self-pay | Admitting: Internal Medicine

## 2014-03-02 NOTE — Telephone Encounter (Signed)
Pa FOR pROLIA COMPLETED,  IN RED FOLDER,  RETURN TO rOSE,  THANKS

## 2014-03-02 NOTE — Telephone Encounter (Signed)
Faxed back to [facility].  

## 2014-03-08 NOTE — Telephone Encounter (Signed)
I have faxed the completed p/a form along w/the DEXA scan from 01/06/2014 [facility] and will notify you once I receive a response. Thank you.

## 2014-03-08 NOTE — Telephone Encounter (Signed)
[employer] has actually responded rather quickly on the authorization.  Auth #[id number] is effective 03/08/2014-03/08/2016.  [name] will have an estimated responsibility of a $40 co-pay plus 20% co-insurance (approx. $180) for a total estimated responsibility of $220.  Please make pt aware this is an estimate and we will not know an exact amt until her insurance has paid. I have sent a copy of the summary of benefits and the prior [facility] to be scanned into pt's chart. If pt cannot afford $220 for the injection, please ask her to call Prolia at [phone] to see if she qualifies for one of their financial assistance programs. If she qualifies they will instruct her how to proceed. If you have any questions, please let me know. Thank you.

## 2014-03-08 NOTE — Telephone Encounter (Signed)
FYI

## 2014-03-08 NOTE — Telephone Encounter (Signed)
Sent mychart with Prolia information

## 2014-03-12 ENCOUNTER — Telehealth: Payer: Self-pay | Admitting: Internal Medicine

## 2014-03-12 NOTE — Telephone Encounter (Signed)
Pt advised of the cost of prolia shot and stated that she will pay the estimated cost of $220. Please advise when shot has come in for pt to be scheduled/msn

## 2014-03-12 NOTE — Telephone Encounter (Signed)
Prolia ordered

## 2014-03-16 ENCOUNTER — Ambulatory Visit: Payer: Medicare PPO

## 2014-03-16 NOTE — Telephone Encounter (Signed)
Appt sch 03/24/14

## 2014-03-24 ENCOUNTER — Ambulatory Visit: Payer: Medicare PPO

## 2014-03-25 ENCOUNTER — Ambulatory Visit (INDEPENDENT_AMBULATORY_CARE_PROVIDER_SITE_OTHER): Payer: Medicare PPO

## 2014-03-25 DIAGNOSIS — M810 Age-related osteoporosis without current pathological fracture: Secondary | ICD-10-CM

## 2014-03-25 MED ORDER — DENOSUMAB 60 MG/ML ~~LOC~~ SOLN
60.0000 mg | Freq: Once | SUBCUTANEOUS | Status: AC
  Administered 2014-03-25: 60 mg via SUBCUTANEOUS

## 2014-04-21 ENCOUNTER — Ambulatory Visit (INDEPENDENT_AMBULATORY_CARE_PROVIDER_SITE_OTHER): Payer: Medicare PPO | Admitting: Internal Medicine

## 2014-04-21 ENCOUNTER — Encounter: Payer: Self-pay | Admitting: Internal Medicine

## 2014-04-21 VITALS — BP 134/80 | HR 67 | Temp 98.4°F | Resp 16 | Ht 64.0 in | Wt 144.2 lb

## 2014-04-21 DIAGNOSIS — R5383 Other fatigue: Secondary | ICD-10-CM

## 2014-04-21 DIAGNOSIS — G43109 Migraine with aura, not intractable, without status migrainosus: Secondary | ICD-10-CM

## 2014-04-21 DIAGNOSIS — E559 Vitamin D deficiency, unspecified: Secondary | ICD-10-CM

## 2014-04-21 DIAGNOSIS — G4700 Insomnia, unspecified: Secondary | ICD-10-CM

## 2014-04-21 DIAGNOSIS — I10 Essential (primary) hypertension: Secondary | ICD-10-CM

## 2014-04-21 DIAGNOSIS — R739 Hyperglycemia, unspecified: Secondary | ICD-10-CM | POA: Diagnosis not present

## 2014-04-21 DIAGNOSIS — E785 Hyperlipidemia, unspecified: Secondary | ICD-10-CM | POA: Diagnosis not present

## 2014-04-21 MED ORDER — ALPRAZOLAM 0.5 MG PO TABS: 0.5000 mg | ORAL_TABLET | Freq: Every evening | ORAL | Status: DC | PRN

## 2014-04-21 MED ORDER — ZOLPIDEM TARTRATE 10 MG PO TABS: ORAL_TABLET | ORAL | Status: DC

## 2014-04-21 NOTE — Patient Instructions (Signed)
I have refilled your Remus Lofflerambien and your alprazolam   Continue you anti aura medications and contact me if they return

## 2014-04-21 NOTE — Progress Notes (Signed)
Pre-visit discussion using our clinic review tool. No additional management support is needed unless otherwise documented below in the visit note.  

## 2014-04-21 NOTE — Progress Notes (Signed)
Patient ID: Laura [name], female   DOB: 22-Feb-1932, 81 y.o.   MRN: [id number]   Patient Active Problem List   Diagnosis Date Noted  . Migraine aura without headache 04/24/2014  . Urinary frequency 01/13/2014  . Encounter for Medicare annual wellness exam 11/01/2013  . Accidental fall 10/16/2013  . Neck pain, acute 10/16/2013  . Coccygeal pain, acute 10/16/2013  . S/P hysterectomy with oophorectomy 01/03/2013  . Grief 10/19/2012  . HTN (hypertension) 10/19/2012  . B12 deficiency 04/16/2012  . Anxiety state 04/16/2012  . Neuropathy of both feet 09/18/2011  . Hyperlipidemia with target LDL less than 130 03/21/2011  . Insomnia 03/21/2011  . Osteoporosis     Subjective:  CC:   Chief Complaint  Patient presents with  . Acute Visit    has aura of migraine but not the headache associated with the migraine.    HPI:   Laura [name] is a 80 y.o. female who presents for recurrent Aura without the migraine   history of migraine headaches infrequently occurring at  20-40 yrs of age,  Then stopped, but would continue to have the headaches.  Which are  now occurring  every 2-3 months  Lasting 15 minutes : flashing light,s scotoma, loss of vision transient.  Has seen eye doctor,    Hd 2 in one day in early February ,  Then started having 2 per week .  Read online a bout auras.    Started takign magnesium Co10 and riboflavin.  Last aura occurred 3 days after starting the supplement     Has had 2 MRIs in the past for headaches. At [facility], folowed by neurology evaluation    Past Medical History  Diagnosis Date  . Osteoporosis     Past Surgical History  Procedure Laterality Date  . Tubal ligation      at age 84, vaginal complicated by peritonitis  . Abdominal hysterectomy      secondary to peritonitis and complications        The following portions of the patient's history were reviewed and updated as appropriate: Allergies, current medications, and problem list.    Review  of Systems:   Patient denies headache, fevers, malaise, unintentional weight loss, skin rash, eye pain, sinus congestion and sinus pain, sore throat, dysphagia,  hemoptysis , cough, dyspnea, wheezing, chest pain, palpitations, orthopnea, edema, abdominal pain, nausea, melena, diarrhea, constipation, flank pain, dysuria, hematuria, urinary  Frequency, nocturia, numbness, tingling, seizures,  Focal weakness, Loss of consciousness,  Tremor, insomnia, depression, anxiety, and suicidal ideation.     History   Social History  . Marital Status: Widowed    Spouse Name: N/A  . Number of Children: N/A  . Years of Education: N/A   Occupational History  . Not on file.   Social History Main Topics  . Smoking status: Former Smoker    Quit date: 03/20/1981  . Smokeless tobacco: Never Used  . Alcohol Use: 8.4 oz/week    14 Glasses of wine per week  . Drug Use: No  . Sexual Activity: Not on file   Other Topics Concern  . Not on file   Social History Narrative    Objective:  Filed Vitals:   04/21/14 1540  BP: 134/80  Pulse: 67  Temp: 98.4 F (36.9 C)  Resp: 16     General appearance: alert, cooperative and appears stated age Ears: normal TM's and external ear canals both ears Throat: lips, mucosa, and tongue normal; teeth and gums normal  Neck: no adenopathy, no carotid bruit, supple, symmetrical, trachea midline and thyroid not enlarged, symmetric, no tenderness/mass/nodules Back: symmetric, no curvature. ROM normal. No CVA tenderness. Lungs: clear to auscultation bilaterally Heart: regular rate and rhythm, S1, S2 normal, no murmur, click, rub or gallop Abdomen: soft, non-tender; bowel sounds normal; no masses,  no organomegaly Pulses: 2+ and symmetric Skin: Skin color, texture, turgor normal. No rashes or lesions Lymph nodes: Cervical, supraclavicular, and axillary nodes normal. Neuro: CNs 2-12 intact. DTRs 2+/4 in biceps, brachioradialis, patellars and achilles. Muscle strength  5/5 in upper and lower exremities. Fine resting tremor bilaterally both hands cerebellar function normal. Romberg negative.  No pronator drift.   Gait normal.   Assessment and Plan:  HTN (hypertension) Well controlled on current regimen. Renal function stable, no changes today.  Lab Results  Component Value Date   CREATININE 0.6 10/30/2013   Lab Results  Component Value Date   NA 138 10/30/2013   K 3.7 10/30/2013   CL 101 10/30/2013   CO2 30 10/30/2013      Migraine aura without headache Occurring 2 or 3 times per week until she started taking her supplemennts which have helped.    Insomnia Managed with Remus Lofflerambien , now trying to wean herself off of it.       Updated Medication List Outpatient Encounter Prescriptions as of 04/21/2014  Medication Sig  . ALPRAZolam (XANAX) 0.5 MG tablet Take 1 tablet (0.5 mg total) by mouth at bedtime as needed for sleep.  Marland Kitchen. aspirin 81 MG tablet Take 81 mg by mouth daily.  . calcium carbonate (OS-CAL) 600 MG TABS Take 600 mg by mouth 2 (two) times daily with a meal.  . Cholecalciferol (VITAMIN D3) 1000 UNITS CAPS Take one by mouth twice a day.  . Coenzyme Q10 (CO Q 10) 100 MG CAPS Take 1 capsule by mouth daily.  . Cyanocobalamin 2500 MCG SUBL Place 1 tablet (2,500 mcg total) under the tongue daily.  . Lutein 20 MG TABS Take one by mouth daily  . MAGNESIUM CITRATE PO Take 200 mg by mouth daily.  . pravastatin (PRAVACHOL) 40 MG tablet Take 1 tablet (40 mg total) by mouth daily.  . Riboflavin 100 MG CAPS Take 1 capsule by mouth daily.  Marland Kitchen. triamterene-hydrochlorothiazide (MAXZIDE-25) 37.5-25 MG per tablet Take 1 tablet by mouth daily.  Marland Kitchen. zolpidem (AMBIEN) 10 MG tablet take 1 tablet by mouth at bedtime if needed  . [DISCONTINUED] ALPRAZolam (XANAX) 0.5 MG tablet Take 1 tablet (0.5 mg total) by mouth at bedtime as needed for sleep.  . [DISCONTINUED] zolpidem (AMBIEN) 10 MG tablet take 1 tablet by mouth at bedtime if needed   A total of 25 minutes  of face to face time was spent with patient more than half of which was spent in counselling and coordination of care    Orders Placed This Encounter  Procedures  . Comprehensive metabolic panel  . CBC with Differential/Platelet  . TSH  . Lipid panel  . Vit D  25 hydroxy (rtn osteoporosis monitoring)  . Hemoglobin A1c    Return in about 6 months (around 10/22/2014).

## 2014-04-24 DIAGNOSIS — G43109 Migraine with aura, not intractable, without status migrainosus: Secondary | ICD-10-CM | POA: Insufficient documentation

## 2014-04-24 NOTE — Assessment & Plan Note (Signed)
Occurring 2 or 3 times per week until she started taking her supplemennts which have helped.

## 2014-04-24 NOTE — Assessment & Plan Note (Signed)
Well controlled on current regimen. Renal function stable, no changes today.  Lab Results  Component Value Date   CREATININE 0.6 10/30/2013   Lab Results  Component Value Date   NA 138 10/30/2013   K 3.7 10/30/2013   CL 101 10/30/2013   CO2 30 10/30/2013

## 2014-04-24 NOTE — Assessment & Plan Note (Signed)
Managed with Remus Lofflerambien , now trying to wean herself off of it.

## 2014-04-27 ENCOUNTER — Other Ambulatory Visit: Payer: Medicare PPO

## 2014-05-05 ENCOUNTER — Other Ambulatory Visit (INDEPENDENT_AMBULATORY_CARE_PROVIDER_SITE_OTHER): Payer: Medicare PPO

## 2014-05-05 DIAGNOSIS — R739 Hyperglycemia, unspecified: Secondary | ICD-10-CM

## 2014-05-05 DIAGNOSIS — E785 Hyperlipidemia, unspecified: Secondary | ICD-10-CM | POA: Diagnosis not present

## 2014-05-05 DIAGNOSIS — R5383 Other fatigue: Secondary | ICD-10-CM | POA: Diagnosis not present

## 2014-05-05 DIAGNOSIS — E559 Vitamin D deficiency, unspecified: Secondary | ICD-10-CM

## 2014-05-05 LAB — CBC WITH DIFFERENTIAL/PLATELET
BASOS PCT: 0.7 % (ref 0.0–3.0)
Basophils Absolute: 0 10*3/uL (ref 0.0–0.1)
EOS ABS: 0.2 10*3/uL (ref 0.0–0.7)
EOS PCT: 3.8 % (ref 0.0–5.0)
HCT: 41.8 % (ref 36.0–46.0)
Hemoglobin: 14.1 g/dL (ref 12.0–15.0)
LYMPHS PCT: 38.5 % (ref 12.0–46.0)
Lymphs Abs: 2.5 10*3/uL (ref 0.7–4.0)
MCHC: 33.7 g/dL (ref 30.0–36.0)
MCV: 88.8 fl (ref 78.0–100.0)
Monocytes Absolute: 0.4 10*3/uL (ref 0.1–1.0)
Monocytes Relative: 5.8 % (ref 3.0–12.0)
NEUTROS ABS: 3.4 10*3/uL (ref 1.4–7.7)
Neutrophils Relative %: 51.2 % (ref 43.0–77.0)
Platelets: 309 10*3/uL (ref 150.0–400.0)
RBC: 4.7 Mil/uL (ref 3.87–5.11)
RDW: 13.3 % (ref 11.5–15.5)
WBC: 6.6 10*3/uL (ref 4.0–10.5)

## 2014-05-05 LAB — LIPID PANEL
CHOLESTEROL: 209 mg/dL — AB (ref 0–200)
HDL: 91.5 mg/dL (ref >39.00)
LDL Cholesterol: 101 mg/dL — ABNORMAL HIGH (ref 0–99)
NonHDL: 117.5
TRIGLYCERIDES: 85 mg/dL (ref 0.0–149.0)
Total CHOL/HDL Ratio: 2
VLDL: 17 mg/dL (ref 0.0–40.0)

## 2014-05-05 LAB — COMPREHENSIVE METABOLIC PANEL
ALT: 13 U/L (ref 0–35)
AST: 14 U/L (ref 0–37)
Albumin: 4.2 g/dL (ref 3.5–5.2)
Alkaline Phosphatase: 50 U/L (ref 39–117)
BILIRUBIN TOTAL: 0.5 mg/dL (ref 0.2–1.2)
BUN: 16 mg/dL (ref 6–23)
CALCIUM: 9.1 mg/dL (ref 8.4–10.5)
CO2: 30 meq/L (ref 19–32)
Chloride: 101 mEq/L (ref 96–112)
Creatinine, Ser: 0.72 mg/dL (ref 0.40–1.20)
GFR: 82.95 mL/min (ref >60.00)
Glucose, Bld: 86 mg/dL (ref 70–99)
Potassium: 4.1 mEq/L (ref 3.5–5.1)
Sodium: 137 mEq/L (ref 135–145)
Total Protein: 6.6 g/dL (ref 6.0–8.3)

## 2014-05-05 LAB — VITAMIN D 25 HYDROXY (VIT D DEFICIENCY, FRACTURES): VITD: 30.82 ng/mL (ref 30.00–100.00)

## 2014-05-05 LAB — TSH: TSH: 2.55 u[IU]/mL (ref 0.35–4.50)

## 2014-05-05 LAB — HEMOGLOBIN A1C: HEMOGLOBIN A1C: 5.4 % (ref 4.6–6.5)

## 2014-05-07 ENCOUNTER — Encounter: Payer: Self-pay | Admitting: Internal Medicine

## 2014-05-25 ENCOUNTER — Other Ambulatory Visit: Payer: Self-pay | Admitting: Internal Medicine

## 2014-05-25 DIAGNOSIS — Z1231 Encounter for screening mammogram for malignant neoplasm of breast: Secondary | ICD-10-CM

## 2014-06-07 ENCOUNTER — Ambulatory Visit: Payer: Self-pay | Admitting: Internal Medicine

## 2014-06-08 ENCOUNTER — Other Ambulatory Visit: Payer: Self-pay | Admitting: Internal Medicine

## 2014-06-09 ENCOUNTER — Ambulatory Visit
Admission: RE | Admit: 2014-06-09 | Discharge: 2014-06-09 | Disposition: A | Discharge status: Home / Self Care | Payer: Medicare PPO | Source: Ambulatory Visit | Attending: Internal Medicine | Admitting: Internal Medicine

## 2014-06-09 DIAGNOSIS — Z1231 Encounter for screening mammogram for malignant neoplasm of breast: Secondary | ICD-10-CM

## 2014-06-10 ENCOUNTER — Encounter: Payer: Self-pay | Admitting: Internal Medicine

## 2014-08-04 ENCOUNTER — Encounter: Payer: Self-pay | Admitting: Internal Medicine

## 2014-08-04 MED ORDER — TOPIRAMATE 25 MG PO CPSP: ORAL_CAPSULE | ORAL | Status: DC

## 2014-08-04 NOTE — Telephone Encounter (Signed)
Generic topiramate sent to [facility] for migraine aura prevention

## 2014-08-17 ENCOUNTER — Ambulatory Visit (INDEPENDENT_AMBULATORY_CARE_PROVIDER_SITE_OTHER): Payer: Medicare PPO | Admitting: Internal Medicine

## 2014-08-17 ENCOUNTER — Encounter: Payer: Self-pay | Admitting: Internal Medicine

## 2014-08-17 VITALS — BP 138/84 | HR 70 | Temp 98.2°F | Resp 12 | Ht 64.0 in | Wt 146.5 lb

## 2014-08-17 DIAGNOSIS — G43109 Migraine with aura, not intractable, without status migrainosus: Secondary | ICD-10-CM | POA: Diagnosis not present

## 2014-08-17 DIAGNOSIS — M545 Low back pain, unspecified: Secondary | ICD-10-CM

## 2014-08-17 MED ORDER — LEVOFLOXACIN 500 MG PO TABS: 500.0000 mg | ORAL_TABLET | Freq: Every day | ORAL | Status: DC

## 2014-08-17 MED ORDER — DIAZEPAM 5 MG PO TABS
5.0000 mg | ORAL_TABLET | Freq: Two times a day (BID) | ORAL | Status: DC | PRN
Start: 2014-08-17 — End: 2014-10-25

## 2014-08-17 NOTE — Patient Instructions (Addendum)
You can take 3 advil every 6 hours if needed and you can add tylenol 500 mg to each dose if needed for back pain   I will give you a muscle relaxer just in case you have a spasm,  Do not combine with  Remus Lofflerambien  Unless you are still wide awake after an hour  We will use valium as your muscle relaxer   I am sending you off with a prescription  Antibiotic

## 2014-08-17 NOTE — Progress Notes (Signed)
Pre-visit discussion using our clinic review tool. No additional management support is needed unless otherwise documented below in the visit note.  

## 2014-08-17 NOTE — Progress Notes (Signed)
Subjective:  Patient ID: Laura [name], female    DOB: [birth year]  Age: 81 y.o. MRN: [id number]  CC: The primary encounter diagnosis was Back pain at L4-L5 level. Diagnoses of Migraine aura without headache and Low back pain without sciatica, unspecified back pain laterality were also pertinent to this visit.  HPI Laura [name] presents for 3 issues  1) recurrent low back pain secondary to DDD .non radiating but recurrent and interfering with leisure activities,   No histoyr of trauma but h does have a history of osteoporosis.   Likes to avoid analgesics,  wil use advil occasionally    2) history of migraine headaches:  She is continuing to have frequent auras but no migraines . She describes the aura as initially a tiny dot of bright light which takes the form of a large letter C that gets progressively larger,  And takes 30 minutes to resolve. Had 4 in June, one on July 4 and again on Saturday / the aura has not changed in years .  But she is now havin themg without headacehs. . Has had eye exam  3) decreased bleeding   Outpatient Prescriptions Prior to Visit  Medication Sig Dispense Refill  . ALPRAZolam (XANAX) 0.5 MG tablet Take 1 tablet (0.5 mg total) by mouth at bedtime as needed for sleep. 30 tablet 3  . aspirin 81 MG tablet Take 81 mg by mouth daily.    . calcium carbonate (OS-CAL) 600 MG TABS Take 600 mg by mouth 2 (two) times daily with a meal.    . Cholecalciferol (VITAMIN D3) 1000 UNITS CAPS Take one by mouth twice a day.    . Coenzyme Q10 (CO Q 10) 100 MG CAPS Take 1 capsule by mouth daily.    . Cyanocobalamin 2500 MCG SUBL Place 1 tablet (2,500 mcg total) under the tongue daily. 90 tablet 2  . Lutein 20 MG TABS Take one by mouth daily    . MAGNESIUM CITRATE PO Take 200 mg by mouth daily.    . pravastatin (PRAVACHOL) 40 MG tablet take 1 tablet by mouth once daily 90 tablet 1  . Riboflavin 100 MG CAPS Take 1 capsule by mouth daily.    Marland Kitchen triamterene-hydrochlorothiazide  (MAXZIDE-25) 37.5-25 MG per tablet Take 1 tablet by mouth daily. 90 tablet 1  . zolpidem (AMBIEN) 10 MG tablet take 1 tablet by mouth at bedtime if needed 30 tablet 5  . topiramate (TOPAMAX) 25 MG capsule Ome tablet daily at bedtime, increase to 2 after 2 weeks if needed (Patient not taking: Reported on 08/17/2014) 60 capsule 1   No facility-administered medications prior to visit.    Review of Systems;  Patient denies headache, fevers, malaise, unintentional weight loss, skin rash, eye pain, sinus congestion and sinus pain, sore throat, dysphagia,  hemoptysis , cough, dyspnea, wheezing, chest pain, palpitations, orthopnea, edema, abdominal pain, nausea, melena, diarrhea, constipation, flank pain, dysuria, hematuria, urinary  Frequency, nocturia, numbness, tingling, seizures,  Focal weakness, Loss of consciousness,  Tremor, insomnia, depression, anxiety, and suicidal ideation.      Objective:  BP 138/84 mmHg  Pulse 70  Temp(Src) 98.2 F (36.8 C) (Oral)  Resp 12  Ht  (1.626 m)  Wt 146 lb 8 oz (66.452 kg)  BMI 25.13 kg/m2  SpO2 98%  BP Readings from Last 3 Encounters:  08/17/14 138/84  04/21/14 134/80  01/12/14 108/68    Wt Readings from Last 3 Encounters:  08/17/14 146 lb 8 oz (66.452 kg)  04/21/14 144 lb 4 oz (65.431 kg)  01/12/14 142 lb 8 oz (64.638 kg)    General appearance: alert, cooperative and appears stated age Ears: normal TM's and external ear canals both ears Throat: lips, mucosa, and tongue normal; teeth and gums normal Neck: no adenopathy, no carotid bruit, supple, symmetrical, trachea midline and thyroid not enlarged, symmetric, no tenderness/mass/nodules Back: symmetric, no curvature. ROM normal. No CVA tenderness. Lungs: clear to auscultation bilaterally Heart: regular rate and rhythm, S1, S2 normal, no murmur, click, rub or gallop Abdomen: soft, non-tender; bowel sounds normal; no masses,  no organomegaly Pulses: 2+ and symmetric Skin: Skin color,  texture, turgor normal. No rashes or lesions Lymph nodes: Cervical, supraclavicular, and axillary nodes normal.  Lab Results  Component Value Date   HGBA1C 5.4 05/05/2014    Lab Results  Component Value Date   CREATININE 0.72 05/05/2014   CREATININE 0.6 10/30/2013   CREATININE 0.7 04/15/2013    Lab Results  Component Value Date   WBC 6.6 05/05/2014   HGB 14.1 05/05/2014   HCT 41.8 05/05/2014   PLT 309.0 05/05/2014   GLUCOSE 86 05/05/2014   CHOL 209* 05/05/2014   TRIG 85.0 05/05/2014   HDL 91.50 05/05/2014   LDLDIRECT 109.7 09/27/2011   LDLCALC 101* 05/05/2014   ALT 13 05/05/2014   AST 14 05/05/2014   NA 137 05/05/2014   K 4.1 05/05/2014   CL 101 05/05/2014   CREATININE 0.72 05/05/2014   BUN 16 05/05/2014   CO2 30 05/05/2014   TSH 2.55 05/05/2014   HGBA1C 5.4 05/05/2014    Mm Screening Breast Tomo Bilateral  06/09/2014   CLINICAL DATA:  Screening.  EXAM: DIGITAL SCREENING BILATERAL MAMMOGRAM WITH 3D TOMO WITH CAD  COMPARISON:  Previous exam(s).  ACR Breast Density Category b: There are scattered areas of fibroglandular density.  FINDINGS: There are no findings suspicious for malignancy. Images were processed with CAD.  IMPRESSION: No mammographic evidence of malignancy. A result letter of this screening mammogram will be mailed directly to the patient.  RECOMMENDATION: Screening mammogram in one year. (Code:SM-B-01Y)  BI-RADS CATEGORY  1: Negative.   Electronically Signed   By: Esperanza Heir M.D.   On: 06/09/2014 15:35    Assessment & Plan:   Problem List Items Addressed This Visit      Unprioritized   Migraine aura without headache    Recommended MRI and neurology evaluation due to change in pattern.  She will consider after her trip to [place].        Lumbago    Her neurology exam is normal.  Muscle relalxer , NSAID and analgesic, non narcotic prescribed. Offered PT referral for core strengthtening exercises but she has deferred for now.       Back pain at  L4-L5 level - Primary      A total of 25 minutes of face to face time was spent with patient more than half of which was spent in counselling about the above mentioned conditions  and coordination of care  I am having Ms. Revelle start on diazepam and levofloxacin. I am also having her maintain her Lutein, aspirin, calcium carbonate, Vitamin D3, triamterene-hydrochlorothiazide, Cyanocobalamin, MAGNESIUM CITRATE PO, Riboflavin, Co Q 10, zolpidem, ALPRAZolam, pravastatin, and topiramate.  Meds ordered this encounter  Medications  . diazepam (VALIUM) 5 MG tablet    Sig: Take 1 tablet (5 mg total) by mouth every 12 (twelve) hours as needed for anxiety or muscle spasms.    Dispense:  30 tablet  Refill:  1  . levofloxacin (LEVAQUIN) 500 MG tablet    Sig: Take 1 tablet (500 mg total) by mouth daily.    Dispense:  7 tablet    Refill:  0    There are no discontinued medications.  Follow-up: No Follow-up on file.   Sherlene ShamsULLO, Dade Rodin L, MD

## 2014-08-20 DIAGNOSIS — M545 Low back pain, unspecified: Secondary | ICD-10-CM | POA: Insufficient documentation

## 2014-08-20 NOTE — Assessment & Plan Note (Signed)
Her neurology exam is normal.  Muscle relalxer , NSAID and analgesic, non narcotic prescribed. Offered PT referral for core strengthtening exercises but she has deferred for now.

## 2014-08-20 NOTE — Assessment & Plan Note (Signed)
Recommended MRI and neurology evaluation due to change in pattern.  She will consider after her trip to [place].

## 2014-09-10 ENCOUNTER — Encounter: Payer: Self-pay | Admitting: Internal Medicine

## 2014-09-11 ENCOUNTER — Other Ambulatory Visit: Payer: Self-pay | Admitting: Internal Medicine

## 2014-09-11 DIAGNOSIS — M545 Low back pain, unspecified: Secondary | ICD-10-CM

## 2014-09-11 NOTE — Assessment & Plan Note (Signed)
Her neurology exam is normal.  Muscle relalxer , NSAID and analgesic, non narcotic prescribed. Offered PT referral for core strengthtening exercises but she has deferred for now.  

## 2014-09-17 ENCOUNTER — Other Ambulatory Visit: Payer: Self-pay | Admitting: Internal Medicine

## 2014-09-17 DIAGNOSIS — M545 Low back pain, unspecified: Secondary | ICD-10-CM

## 2014-09-20 ENCOUNTER — Telehealth: Payer: Self-pay | Admitting: *Deleted

## 2014-09-20 NOTE — Telephone Encounter (Signed)
Patient called to schedule her prolia injection, for 8/25 at 9am. Please advise if this will enough time to order the injection.-Thanks

## 2014-09-20 NOTE — Telephone Encounter (Signed)
Ordered the injection, I will let you know when it comes in.

## 2014-09-20 NOTE — Telephone Encounter (Signed)
Patient was approved in 2/16 for prolia can the injection be ordered by 09/23/14?

## 2014-09-23 ENCOUNTER — Ambulatory Visit (INDEPENDENT_AMBULATORY_CARE_PROVIDER_SITE_OTHER): Payer: Medicare PPO

## 2014-09-23 DIAGNOSIS — M810 Age-related osteoporosis without current pathological fracture: Secondary | ICD-10-CM

## 2014-09-23 MED ORDER — DENOSUMAB 60 MG/ML ~~LOC~~ SOLN
60.0000 mg | Freq: Once | SUBCUTANEOUS | Status: AC
  Administered 2014-09-23: 60 mg via SUBCUTANEOUS

## 2014-10-10 ENCOUNTER — Encounter: Payer: Self-pay | Admitting: Emergency Medicine

## 2014-10-10 ENCOUNTER — Emergency Department
Admission: EM | Admit: 2014-10-10 | Discharge: 2014-10-10 | Disposition: A | Discharge status: Home / Self Care | Payer: Medicare PPO | Attending: Emergency Medicine | Admitting: Emergency Medicine

## 2014-10-10 ENCOUNTER — Emergency Department: Payer: Medicare PPO

## 2014-10-10 DIAGNOSIS — Z87891 Personal history of nicotine dependence: Secondary | ICD-10-CM | POA: Diagnosis not present

## 2014-10-10 DIAGNOSIS — I10 Essential (primary) hypertension: Secondary | ICD-10-CM | POA: Diagnosis not present

## 2014-10-10 DIAGNOSIS — R4781 Slurred speech: Secondary | ICD-10-CM | POA: Diagnosis not present

## 2014-10-10 DIAGNOSIS — R4701 Aphasia: Secondary | ICD-10-CM | POA: Diagnosis present

## 2014-10-10 DIAGNOSIS — Z792 Long term (current) use of antibiotics: Secondary | ICD-10-CM | POA: Insufficient documentation

## 2014-10-10 DIAGNOSIS — Z79899 Other long term (current) drug therapy: Secondary | ICD-10-CM | POA: Diagnosis not present

## 2014-10-10 DIAGNOSIS — G459 Transient cerebral ischemic attack, unspecified: Secondary | ICD-10-CM | POA: Diagnosis not present

## 2014-10-10 DIAGNOSIS — Z7982 Long term (current) use of aspirin: Secondary | ICD-10-CM | POA: Insufficient documentation

## 2014-10-10 DIAGNOSIS — G43909 Migraine, unspecified, not intractable, without status migrainosus: Secondary | ICD-10-CM | POA: Diagnosis not present

## 2014-10-10 LAB — BASIC METABOLIC PANEL
ANION GAP: 8 (ref 5–15)
BUN: 13 mg/dL (ref 6–20)
CO2: 26 mmol/L (ref 22–32)
Calcium: 8.9 mg/dL (ref 8.9–10.3)
Chloride: 105 mmol/L (ref 101–111)
Creatinine, Ser: 0.71 mg/dL (ref 0.44–1.00)
Glucose, Bld: 95 mg/dL (ref 65–99)
POTASSIUM: 3.4 mmol/L — AB (ref 3.5–5.1)
SODIUM: 139 mmol/L (ref 135–145)

## 2014-10-10 LAB — URINALYSIS COMPLETE WITH MICROSCOPIC (ARMC ONLY)
BACTERIA UA: NONE SEEN
Bilirubin Urine: NEGATIVE
Glucose, UA: NEGATIVE mg/dL
HGB URINE DIPSTICK: NEGATIVE
KETONES UR: NEGATIVE mg/dL
LEUKOCYTES UA: NEGATIVE
NITRITE: NEGATIVE
PH: 6 (ref 5.0–8.0)
PROTEIN: NEGATIVE mg/dL
Specific Gravity, Urine: 1.005 (ref 1.005–1.030)

## 2014-10-10 LAB — CBC WITH DIFFERENTIAL/PLATELET
BASOS ABS: 0 10*3/uL (ref 0–0.1)
BASOS PCT: 1 %
Eosinophils Absolute: 0.3 10*3/uL (ref 0–0.7)
Eosinophils Relative: 5 %
HEMATOCRIT: 40.4 % (ref 35.0–47.0)
Hemoglobin: 13.9 g/dL (ref 12.0–16.0)
LYMPHS PCT: 35 %
Lymphs Abs: 2.4 10*3/uL (ref 1.0–3.6)
MCH: 30.5 pg (ref 26.0–34.0)
MCHC: 34.3 g/dL (ref 32.0–36.0)
MCV: 88.8 fL (ref 80.0–100.0)
Monocytes Absolute: 0.7 10*3/uL (ref 0.2–0.9)
Monocytes Relative: 10 %
NEUTROS ABS: 3.6 10*3/uL (ref 1.4–6.5)
NEUTROS PCT: 51 %
PLATELETS: 232 10*3/uL (ref 150–440)
RBC: 4.54 MIL/uL (ref 3.80–5.20)
RDW: 13 % (ref 11.5–14.5)
WBC: 7.1 10*3/uL (ref 3.6–11.0)

## 2014-10-10 LAB — TROPONIN I

## 2014-10-10 MED ORDER — ASPIRIN 81 MG PO CHEW
81.0000 mg | CHEWABLE_TABLET | Freq: Once | ORAL | Status: AC
  Administered 2014-10-10: 81 mg via ORAL
  Filled 2014-10-10: qty 1

## 2014-10-10 NOTE — ED Notes (Signed)
Patient to MRI.

## 2014-10-10 NOTE — ED Provider Notes (Signed)
[facility] Emergency Department Provider Note   ____________________________________________  Time seen:  I have reviewed the triage vital signs and the triage nursing note.  HISTORY  Chief Complaint Aphasia   Historian Patient  HPI Laura [name] is a 81 y.o. female who is a history of migraine auras which consist of a spot in her left visual field that usually last for approximately 15 minutes and then goes when its own area that she's had this migraine aura on and off for many years now. Since January it seems to come more frequently. She is to have this 3-4 times per year, and now it seems like it's coming every month or even more frequently recently.  She is coming in today for evaluation for an episode of aphasia. [place] friend when she developed her typical migraine aura. That had resolved and they were in the car driving down the mountains when she was telling a story and noticed that her words were not coming out correctly. She waited before talking again several minutes and then noticed it was still there. She waited a few minutes again, and then it was resolved. She's never had an episode of dysarthria or aphasia with migraine aura in the past. There is no headache. She does not typically get headache associated with the migraine aura.      Past Medical History  Diagnosis Date  . Osteoporosis     Patient Active Problem List   Diagnosis Date Noted  . Lumbago 08/20/2014  . Back pain at L4-L5 level 08/17/2014  . Migraine aura without headache 04/24/2014  . Urinary frequency 01/13/2014  . Encounter for Medicare annual wellness exam 11/01/2013  . Accidental fall 10/16/2013  . Neck pain, acute 10/16/2013  . Coccygeal pain, acute 10/16/2013  . S/P hysterectomy with oophorectomy 01/03/2013  . Grief 10/19/2012  . HTN (hypertension) 10/19/2012  . B12 deficiency 04/16/2012  . Anxiety state  04/16/2012  . Neuropathy of both feet 09/18/2011  . Hyperlipidemia with target LDL less than 130 03/21/2011  . Insomnia 03/21/2011  . Osteoporosis     Past Surgical History  Procedure Laterality Date  . Tubal ligation      at age 75, vaginal complicated by peritonitis  . Abdominal hysterectomy      secondary to peritonitis and complications     Current Outpatient Rx  Name  Route  Sig  Dispense  Refill  . ALPRAZolam (XANAX) 0.5 MG tablet   Oral   Take 1 tablet (0.5 mg total) by mouth at bedtime as needed for sleep.   30 tablet   3   . aspirin 81 MG tablet   Oral   Take 81 mg by mouth daily.         . calcium carbonate (OS-CAL) 600 MG TABS   Oral   Take 600 mg by mouth 2 (two) times daily with a meal.         . Cholecalciferol (VITAMIN D3) 1000 UNITS CAPS      Take one by mouth twice a day.         . Coenzyme Q10 (CO Q 10) 100 MG CAPS   Oral   Take 1 capsule by mouth daily.         . Cyanocobalamin 2500 MCG SUBL   Sublingual   Place 1 tablet (2,500 mcg total) under the tongue daily.   90 tablet   2   .  diazepam (VALIUM) 5 MG tablet   Oral   Take 1 tablet (5 mg total) by mouth every 12 (twelve) hours as needed for anxiety or muscle spasms.   30 tablet   1   . levofloxacin (LEVAQUIN) 500 MG tablet   Oral   Take 1 tablet (500 mg total) by mouth daily.   7 tablet   0   . Lutein 20 MG TABS      Take one by mouth daily         . MAGNESIUM CITRATE PO   Oral   Take 200 mg by mouth daily.         . pravastatin (PRAVACHOL) 40 MG tablet      take 1 tablet by mouth once daily   90 tablet   1   . Riboflavin 100 MG CAPS   Oral   Take 1 capsule by mouth daily.         Marland Kitchen topiramate (TOPAMAX) 25 MG capsule      Ome tablet daily at bedtime, increase to 2 after 2 weeks if needed Patient not taking: Reported on 08/17/2014   60 capsule   1   . triamterene-hydrochlorothiazide (MAXZIDE-25) 37.5-25 MG per tablet   Oral   Take 1 tablet by mouth  daily.   90 tablet   1   . zolpidem (AMBIEN) 10 MG tablet      take 1 tablet by mouth at bedtime if needed   30 tablet   5     Patient will pay cash  .  Please fill     Allergies Erythromycin  Family History  Problem Relation Age of Onset  . Heart disease Mother   . Breast cancer Mother 66  . Breast cancer Sister 71    Social History Social History  Substance Use Topics  . Smoking status: Former Smoker    Quit date: 03/20/1981  . Smokeless tobacco: Never Used  . Alcohol Use: 8.4 oz/week    14 Glasses of wine per week    Review of Systems  Constitutional: Negative for fever. Eyes: Her Left Visual Field As per History Of Present Illness. Currently Resolved. ENT: Negative for sore throat. Cardiovascular: Negative for chest pain. Respiratory: Negative for shortness of breath. Gastrointestinal: Negative for abdominal pain, vomiting and diarrhea. Genitourinary: Negative for dysuria. Musculoskeletal: Negative for back pain. Skin: Negative for rash. Neurological: Negative for headache. 10 point Review of Systems otherwise negative ____________________________________________   PHYSICAL EXAM:  VITAL SIGNS: ED Triage Vitals  Enc Vitals Group     BP 10/10/14 1756 148/67 mmHg     Pulse Rate 10/10/14 1756 86     Resp 10/10/14 1756 20     Temp 10/10/14 1756 98.4 F (36.9 C)     Temp Source 10/10/14 1756 Oral     SpO2 10/10/14 1756 97 %     Weight 10/10/14 1756 140 lb (63.504 kg)     Height 10/10/14 1756  (1.626 m)     Head Cir --      Peak Flow --      Pain Score --      Pain Loc --      Pain Edu? --      Excl. in GC? --      Constitutional: Alert and oriented. Well appearing and in no distress. Eyes: Conjunctivae are normal. PERRL. Normal extraocular movements. ENT   Head: Normocephalic and atraumatic.   Nose: No congestion/rhinnorhea.   Mouth/Throat: Mucous membranes  are moist.   Neck: No stridor. Cardiovascular/Chest: Normal rate,  regular rhythm.  No murmurs, rubs, or gallops. Respiratory: Normal respiratory effort without tachypnea nor retractions. Breath sounds are clear and equal bilaterally. No wheezes/rales/rhonchi. Gastrointestinal: Soft. No distention, no guarding, no rebound. Nontender   Genitourinary/rectal:Deferred Musculoskeletal: Nontender with normal range of motion in all extremities. No joint effusions.  No lower extremity tenderness.  No edema. Neurologic:  Normal speech and language. 5 out of 5 strength in 4 extremities. No facial droop. No gross or focal neurologic deficits are appreciated. Skin:  Skin is warm, dry and intact. No rash noted. Psychiatric: Mood and affect are normal. Speech and behavior are normal. Patient exhibits appropriate insight and judgment.  ____________________________________________   EKG I, Governor Rooks, MD, the attending physician have personally viewed and interpreted all ECGs.  70 bpm. Normal sinus rhythm. Normal QRS. Left axis deviation. Nonspecific T wave. ____________________________________________  LABS (pertinent positives/negatives)  Troponin less than 0.03 Basic metabolic panel without significant abnormalities. Potassium 3.4 CBC is normal Urinalysis negative  ____________________________________________  RADIOLOGY All Xrays were viewed by me. Imaging interpreted by Radiologist.  CT head noncontrast: No acute intracranial abnormalities. Brain atrophy  MRI brain without contrast:  IMPRESSION: 1. No acute finding, including infarct. 2. Age congruent senescent change. 3. ~4 cm arachnoid cyst in the posterior left frontal region with mild growth since 2007. __________________________________________  PROCEDURES  Procedure(s) performed: None  Critical Care performed: None  ____________________________________________   ED COURSE / ASSESSMENT AND PLAN  CONSULTATIONS: None  Pertinent labs & imaging results that were available during my care  of the patient were reviewed by me and considered in my medical decision making (see chart for details).   Patient had approximately 5-15 minutes of difficulty with speech which sounds like and that sounds like an aphasia episode.  Preceding that episode she did have a migraine aura today consistent with prior migraine auras area and she has had increase in these migraine auras over the last several months and weeks. Head CT was negative. Consideration about whether or not this was an additional symptom related to migraine aura, versus transient ischemic attack is cussed with patient and family. She would really like to go home. I discussed with her obtaining an MRI in the ER to make sure there are no underlying signs of stroke.  ----------------------------------------- 10:01 PM on 10/10/2014 -----------------------------------------  MRI is reassuring for no acute stroke visualized. Patient will follow up with her primary care physician X1 to 2 days for further evaluation. We discussed return precautions.  Patient / Family / Caregiver informed of clinical course, medical decision-making process, and agree with plan.   I discussed return precautions, follow-up instructions, and discharged instructions with patient and/or family.  ___________________________________________   FINAL CLINICAL IMPRESSION(S) / ED DIAGNOSES   Final diagnoses:  Transient cerebral ischemia, unspecified transient cerebral ischemia type       Governor Rooks, MD 10/10/14 2204

## 2014-10-10 NOTE — Discharge Instructions (Signed)
You were evaluated after an episode somewhat different from your typical migraine aura with an episode of trouble with your speech. I suspect this may have been a transient ischemic attack commonly called a mini stroke. Take aspirin 81 mg daily.  Your MRI and CT scan were reassuring for no acute stroke. Follow-up with your primary care physician in the next 1-2 days for the additional evaluation and management including possible ultrasound of the carotid arteries, possible echocardiogram, and possible neurology referral.  Return to the emergency room for any new or worsening condition including weakness, numbness, confusion, altered mental status, trouble with speech, or any other symptoms concerning to you.   Transient Ischemic Attack A transient ischemic attack (TIA) is a "warning stroke" that causes stroke-like symptoms. Unlike a stroke, a TIA does not cause permanent damage to the brain. The symptoms of a TIA can happen very fast and do not last long. It is important to know the symptoms of a TIA and what to do. This can help prevent a major stroke or death. CAUSES   A TIA is caused by a temporary blockage in an artery in the brain or neck (carotid artery). The blockage does not allow the brain to get the blood supply it needs and can cause different symptoms. The blockage can be caused by either:  A blood clot.  Fatty buildup (plaque) in a neck or brain artery. RISK FACTORS  High blood pressure (hypertension).  High cholesterol.  Diabetes mellitus.  Heart disease.  The build up of plaque in the blood vessels (peripheral artery disease or atherosclerosis).  The build up of plaque in the blood vessels providing blood and oxygen to the brain (carotid artery stenosis).  An abnormal heart rhythm (atrial fibrillation).  Obesity.  Smoking.  Taking oral contraceptives (especially in combination with smoking).  Physical inactivity.  A diet high in fats, salt (sodium), and  calories.  Alcohol use.  Use of illegal drugs (especially cocaine and methamphetamine).  Being female.  Being African American.  Being over the age of 44.  Family history of stroke.  Previous history of blood clots, stroke, TIA, or heart attack.  Sickle cell disease. SYMPTOMS  TIA symptoms are the same as a stroke but are temporary. These symptoms usually develop suddenly, or may be newly present upon awakening from sleep:  Sudden weakness or numbness of the face, arm, or leg, especially on one side of the body.  Sudden trouble walking or difficulty moving arms or legs.  Sudden confusion.  Sudden personality changes.  Trouble speaking (aphasia) or understanding.  Difficulty swallowing.  Sudden trouble seeing in one or both eyes.  Double vision.  Dizziness.  Loss of balance or coordination.  Sudden severe headache with no known cause.  Trouble reading or writing.  Loss of bowel or bladder control.  Loss of consciousness. DIAGNOSIS  Your caregiver may be able to determine the presence or absence of a TIA based on your symptoms, history, and physical exam. Computed tomography (CT scan) of the brain is usually performed to help identify a TIA. Other tests may be done to diagnose a TIA. These tests may include:  Electrocardiography.  Continuous heart monitoring.  Echocardiography.  Carotid ultrasonography.  Magnetic resonance imaging (MRI).  A scan of the brain circulation.  Blood tests. PREVENTION  The risk of a TIA can be decreased by appropriately treating high blood pressure, high cholesterol, diabetes, heart disease, and obesity and by quitting smoking, limiting alcohol, and staying physically active. TREATMENT  Time is of the essence. Since the symptoms of TIA are the same as a stroke, it is important to seek treatment as soon as possible because you may need a medicine to dissolve the clot (thrombolytic) that cannot be given if too much time has  passed. Treatment options vary. Treatment options may include rest, oxygen, intravenous (IV) fluids, and medicines to thin the blood (anticoagulants). Medicines and diet may be used to address diabetes, high blood pressure, and other risk factors. Measures will be taken to prevent short-term and long-term complications, including infection from breathing foreign material into the lungs (aspiration pneumonia), blood clots in the legs, and falls. Treatment options include procedures to either remove plaque in the carotid arteries or dilate carotid arteries that have narrowed due to plaque. Those procedures are:  Carotid endarterectomy.  Carotid angioplasty and stenting. HOME CARE INSTRUCTIONS   Take all medicines prescribed by your caregiver. Follow the directions carefully. Medicines may be used to control risk factors for a stroke. Be sure you understand all your medicine instructions.  You may be told to take aspirin or the anticoagulant warfarin. Warfarin needs to be taken exactly as instructed.  Taking too much or too little warfarin is dangerous. Too much warfarin increases the risk of bleeding. Too little warfarin continues to allow the risk for blood clots. While taking warfarin, you will need to have regular blood tests to measure your blood clotting time. A PT blood test measures how long it takes for blood to clot. Your PT is used to calculate another value called an INR. Your PT and INR help your caregiver to adjust your dose of warfarin. The dose can change for many reasons. It is critically important that you take warfarin exactly as prescribed.  Many foods, especially foods high in vitamin K can interfere with warfarin and affect the PT and INR. Foods high in vitamin K include spinach, kale, broccoli, cabbage, collard and turnip greens, brussels sprouts, peas, cauliflower, seaweed, and parsley as well as beef and pork liver, green tea, and soybean oil. You should eat a consistent amount of  foods high in vitamin K. Avoid major changes in your diet, or notify your caregiver before changing your diet. Arrange a visit with a dietitian to answer your questions.  Many medicines can interfere with warfarin and affect the PT and INR. You must tell your caregiver about any and all medicines you take, this includes all vitamins and supplements. Be especially cautious with aspirin and anti-inflammatory medicines. Do not take or discontinue any prescribed or over-the-counter medicine except on the advice of your caregiver or pharmacist.  Warfarin can have side effects, such as excessive bruising or bleeding. You will need to hold pressure over cuts for longer than usual. Your caregiver or pharmacist will discuss other potential side effects.  Avoid sports or activities that may cause injury or bleeding.  Be mindful when shaving, flossing your teeth, or handling sharp objects.  Alcohol can change the body's ability to handle warfarin. It is best to avoid alcoholic drinks or consume only very small amounts while taking warfarin. Notify your caregiver if you change your alcohol intake.  Notify your dentist or other caregivers before procedures.  Eat a diet that includes 5 or more servings of fruits and vegetables each day. This may reduce the risk of stroke. Certain diets may be prescribed to address high blood pressure, high cholesterol, diabetes, or obesity.  A low-sodium, low-saturated fat, low-trans fat, low-cholesterol diet is recommended to manage high  blood pressure.  A low-saturated fat, low-trans fat, low-cholesterol, and high-fiber diet may control cholesterol levels.  A controlled-carbohydrate, controlled-sugar diet is recommended to manage diabetes.  A reduced-calorie, low-sodium, low-saturated fat, low-trans fat, low-cholesterol diet is recommended to manage obesity.  Maintain a healthy weight.  Stay physically active. It is recommended that you get at least 30 minutes of  activity on most or all days.  Do not smoke.  Limit alcohol use even if you are not taking warfarin. Moderate alcohol use is considered to be:  No more than 2 drinks each day for men.  No more than 1 drink each day for nonpregnant women.  Stop drug abuse.  Home safety. A safe home environment is important to reduce the risk of falls. Your caregiver may arrange for specialists to evaluate your home. Having grab bars in the bedroom and bathroom is often important. Your caregiver may arrange for equipment to be used at home, such as raised toilets and a seat for the shower.  Follow all instructions for follow-up with your caregiver. This is very important. This includes any referrals and lab tests. Proper follow up can prevent a stroke or another TIA from occurring. SEEK MEDICAL CARE IF:  You have personality changes.  You have difficulty swallowing.  You are seeing double.  You have dizziness.  You have a fever.  You have skin breakdown. SEEK IMMEDIATE MEDICAL CARE IF:  Any of these symptoms may represent a serious problem that is an emergency. Do not wait to see if the symptoms will go away. Get medical help right away. Call your local emergency services (911 in [place] Do not drive yourself to the hospital.  You have sudden weakness or numbness of the face, arm, or leg, especially on one side of the body.  You have sudden trouble walking or difficulty moving arms or legs.  You have sudden confusion.  You have trouble speaking (aphasia) or understanding.  You have sudden trouble seeing in one or both eyes.  You have a loss of balance or coordination.  You have a sudden, severe headache with no known cause.  You have new chest pain or an irregular heartbeat.  You have a partial or total loss of consciousness. MAKE SURE YOU:   Understand these instructions.  Will watch your condition.  Will get help right away if you are not doing well or get worse. Document  Released: 10/25/2004 Document Revised: 01/20/2013 Document Reviewed: 04/22/2013 [facility] Patient Information 2015 [place], [place]. This information is not intended to replace advice given to you by your health care provider. Make sure you discuss any questions you have with your health care provider.

## 2014-10-10 NOTE — ED Notes (Signed)
Patient states she was in [place] with a friend and developed aura (vision spots) for approx 30 minutes. Patient has h/o migraines and wanted to wait until she came home. States while riding home (was passenger), developed aphasia that lasted approx 15 minutes. States she was unable to get out words she wanted to and speech was garbled. Patient denies any unilateral weakness and states she feels normal again now.

## 2014-10-10 NOTE — ED Notes (Signed)
Patient was at [place] this afternoon. Has migraine auras. Had aura at lunch and then got into car to come home. Words got garbled on the way home for about 5-10 minutes. Then was able to communicate without complication. Has spoken with her MD about this.

## 2014-10-10 NOTE — ED Notes (Signed)
Patient returned from CT. EKG performed. Resting comfortably. Daughter at bedside.

## 2014-10-11 ENCOUNTER — Ambulatory Visit (INDEPENDENT_AMBULATORY_CARE_PROVIDER_SITE_OTHER): Payer: Medicare PPO | Admitting: Internal Medicine

## 2014-10-11 ENCOUNTER — Telehealth: Payer: Self-pay | Admitting: Internal Medicine

## 2014-10-11 ENCOUNTER — Encounter: Payer: Self-pay | Admitting: Internal Medicine

## 2014-10-11 VITALS — BP 124/78 | HR 81 | Temp 98.2°F | Resp 14 | Ht 64.0 in | Wt 147.2 lb

## 2014-10-11 DIAGNOSIS — G459 Transient cerebral ischemic attack, unspecified: Secondary | ICD-10-CM | POA: Diagnosis not present

## 2014-10-11 DIAGNOSIS — G43109 Migraine with aura, not intractable, without status migrainosus: Secondary | ICD-10-CM

## 2014-10-11 DIAGNOSIS — E559 Vitamin D deficiency, unspecified: Secondary | ICD-10-CM

## 2014-10-11 DIAGNOSIS — M5416 Radiculopathy, lumbar region: Secondary | ICD-10-CM | POA: Diagnosis not present

## 2014-10-11 DIAGNOSIS — M5136 Other intervertebral disc degeneration, lumbar region: Secondary | ICD-10-CM | POA: Diagnosis not present

## 2014-10-11 DIAGNOSIS — E876 Hypokalemia: Secondary | ICD-10-CM | POA: Diagnosis not present

## 2014-10-11 DIAGNOSIS — M545 Low back pain, unspecified: Secondary | ICD-10-CM

## 2014-10-11 DIAGNOSIS — Z113 Encounter for screening for infections with a predominantly sexual mode of transmission: Secondary | ICD-10-CM

## 2014-10-11 DIAGNOSIS — E785 Hyperlipidemia, unspecified: Secondary | ICD-10-CM

## 2014-10-11 MED ORDER — CLOPIDOGREL BISULFATE 75 MG PO TABS: 75.0000 mg | ORAL_TABLET | Freq: Every day | ORAL | Status: DC

## 2014-10-11 MED ORDER — POTASSIUM CHLORIDE CRYS ER 20 MEQ PO TBCR: 20.0000 meq | EXTENDED_RELEASE_TABLET | Freq: Every day | ORAL | Status: DC

## 2014-10-11 MED ORDER — ZOLPIDEM TARTRATE 10 MG PO TABS: ORAL_TABLET | ORAL | Status: DC

## 2014-10-11 NOTE — Telephone Encounter (Signed)
Pt was seen in the er for Aphasia. Pt has appt for 5pm on 9/12

## 2014-10-11 NOTE — Progress Notes (Signed)
Subjective:  Patient ID: Laura [name], female    DOB: July 16, 1932  Age: 81 y.o. MRN: [id number]  CC: The primary encounter diagnosis was Transient cerebral ischemia, unspecified transient cerebral ischemia type. Diagnoses of Migraine aura without headache, Midline low back pain without sciatica, Hypokalemia, Hyperlipidemia, Screen for STD (sexually transmitted disease), and Vitamin D deficiency were also pertinent to this visit.  HPI: Laura [name] presents for ER follow up.  Patient was referred to ER by Triage RN after reporting a brief episode of garbled speech that occurred on Sept 11 while returning from a mountain vacation with a friend.  Patient has a history of migraine disorder with recent increased frequency of auras occurring without headaches, up to 5 per month, and thought initially that the garbled speech was a new aura manifestation , since it occurred after exposure to bright sunlight, which is her usual trigger. The entire episode lasted < 10 minutes and was not accompanied by headache, vertigo, numbness or weakness .  When she returned home to [place],  She called the [facility] Triage RN who recommended ER evaluation.  She underwent EKG, CT head followed by  MRI of brain before being released.  She takes a baby aspirin daily, as well as a statin, and has  PMH of hypertension and hyperlipidemia .  She adits that most migraine auras have been experienced while alone,  So she is not sure if her speech would have been garbled on prior episodes. Prior auras have been described as scotomas resembling a gradually enlarging letter "C".  She had been advised to have MRI and neurology appointment at her last scheduled visit  but deferred until now because she was planning a trip to [place].  The trip did occur this summer, and  was noteworthy  for the absence of an occurring auras during the 17 days of travel. She did NOT  Start topiramate after last visit.    2)She had her Physiatry appt  this morning . For chronic back pain radiating to buttocks.  She deferred additional treatment since her symptoms had improved.  But is now requesting referral for  PT   Outpatient Prescriptions Prior to Visit  Medication Sig Dispense Refill  . ALPRAZolam (XANAX) 0.5 MG tablet Take 1 tablet (0.5 mg total) by mouth at bedtime as needed for sleep. 30 tablet 3  . aspirin 81 MG tablet Take 81 mg by mouth daily.    . calcium carbonate (OS-CAL) 600 MG TABS Take 600 mg by mouth 2 (two) times daily with a meal.    . Cholecalciferol (VITAMIN D3) 1000 UNITS CAPS Take one by mouth twice a day.    . Coenzyme Q10 (CO Q 10) 100 MG CAPS Take 1 capsule by mouth daily.    . Cyanocobalamin 2500 MCG SUBL Place 1 tablet (2,500 mcg total) under the tongue daily. 90 tablet 2  . diazepam (VALIUM) 5 MG tablet Take 1 tablet (5 mg total) by mouth every 12 (twelve) hours as needed for anxiety or muscle spasms. 30 tablet 1  . Lutein 20 MG TABS Take one by mouth daily    . MAGNESIUM CITRATE PO Take 200 mg by mouth daily.    . pravastatin (PRAVACHOL) 40 MG tablet take 1 tablet by mouth once daily 90 tablet 1  . Riboflavin 100 MG CAPS Take 1 capsule by mouth daily.    Marland Kitchen triamterene-hydrochlorothiazide (MAXZIDE-25) 37.5-25 MG per tablet Take 1 tablet by mouth daily. 90 tablet 1  . zolpidem (  AMBIEN) 10 MG tablet take 1 tablet by mouth at bedtime if needed 30 tablet 5  . topiramate (TOPAMAX) 25 MG capsule Ome tablet daily at bedtime, increase to 2 after 2 weeks if needed (Patient not taking: Reported on 08/17/2014) 60 capsule 1  . levofloxacin (LEVAQUIN) 500 MG tablet Take 1 tablet (500 mg total) by mouth daily. (Patient not taking: Reported on 10/11/2014) 7 tablet 0   No facility-administered medications prior to visit.    Review of Systems;  Patient denies headache, fevers, malaise, unintentional weight loss, skin rash, eye pain, sinus congestion and sinus pain, sore throat, dysphagia,  hemoptysis , cough, dyspnea,  wheezing, chest pain, palpitations, orthopnea, edema, abdominal pain, nausea, melena, diarrhea, constipation, flank pain, dysuria, hematuria, urinary  Frequency, nocturia, numbness, tingling, seizures,  Focal weakness, Loss of consciousness,  Tremor, insomnia, depression, anxiety, and suicidal ideation.      Objective:  BP 124/78 mmHg  Pulse 81  Temp(Src) 98.2 F (36.8 C) (Oral)  Resp 14  Ht 5\' 4"  (1.626 m)  Wt 147 lb 4 oz (66.792 kg)  BMI 25.26 kg/m2  SpO2 99%  BP Readings from Last 3 Encounters:  10/11/14 124/78  10/10/14 134/81  08/17/14 138/84    Wt Readings from Last 3 Encounters:  10/11/14 147 lb 4 oz (66.792 kg)  10/10/14 140 lb (63.504 kg)  08/17/14 146 lb 8 oz (66.452 kg)    General appearance: alert, cooperative and appears stated age Neck: no adenopathy, no carotid bruit, supple, symmetrical, trachea midline and thyroid not enlarged, symmetric, no tenderness/mass/nodules Lungs: clear to auscultation bilaterally Heart: regular rate and rhythm, S1, S2 normal, no murmur, click, rub or gallop Pulses: 2+ and symmetric Skin: Skin color, texture, turgor normal. No rashes or lesions Lymph nodes: Cervical, supraclavicular, and axillary nodes normal. Neuro: CNs 2-12 intact. DTRs 2+/4 in biceps, brachioradialis, patellars and achilles. Muscle strength 5/5 in upper and lower exremities. Fine resting tremor bilaterally both hands cerebellar function normal. Romberg negative.  No pronator drift.   Gait normal.   Lab Results  Component Value Date   HGBA1C 5.4 05/05/2014    Lab Results  Component Value Date   CREATININE 0.71 10/10/2014   CREATININE 0.72 05/05/2014   CREATININE 0.6 10/30/2013    Lab Results  Component Value Date   WBC 7.1 10/10/2014   HGB 13.9 10/10/2014   HCT 40.4 10/10/2014   PLT 232 10/10/2014   GLUCOSE 95 10/10/2014   CHOL 209* 05/05/2014   TRIG 85.0 05/05/2014   HDL 91.50 05/05/2014   LDLDIRECT 109.7 09/27/2011   LDLCALC 101* 05/05/2014    ALT 13 05/05/2014   AST 14 05/05/2014   NA 139 10/10/2014   K 3.4* 10/10/2014   CL 105 10/10/2014   CREATININE 0.71 10/10/2014   BUN 13 10/10/2014   CO2 26 10/10/2014   TSH 2.55 05/05/2014   HGBA1C 5.4 05/05/2014    Ct Head Wo Contrast  10/10/2014   CLINICAL DATA:  Migraine are OS.  Difficulty with speech  EXAM: CT HEAD WITHOUT CONTRAST  TECHNIQUE: Contiguous axial images were obtained from the base of the skull through the vertex without intravenous contrast.  COMPARISON:  12/22/2008.  FINDINGS: Prominence of the sulci and ventricles noted consistent with brain atrophy. The cerebral and cerebellar hemispheres are otherwise normal in attenuation and morphology. There is no evidence for acute intracranial hemorrhage, cortical infarct or mass. The paranasal sinuses and mastoid air cells are clear. The calvarium appears intact.  IMPRESSION: 1. No acute intracranial  abnormalities. 2. Brain atrophy.   Electronically Signed   By: Signa Kell M.D.   On: 10/10/2014 18:34   Mr Brain Wo Contrast  10/10/2014   CLINICAL DATA:  Slurred speech lasting 10-15 minutes today at lunch. History of migraine with aura  EXAM: MRI HEAD WITHOUT CONTRAST  TECHNIQUE: Multiplanar, multiecho pulse sequences of the brain and surrounding structures were obtained without intravenous contrast.  COMPARISON:  12/22/2008  FINDINGS: Calvarium and upper cervical spine: No focal marrow signal abnormality.  Orbits: No significant findings.  Sinuses and Mastoids: Clear. Mastoid and middle ears are clear.  Brain: No acute abnormality such as acute infarct, hemorrhage, hydrocephalus, or intra-axial mass lesion. No evidence of large vessel occlusion.  There is a CSF intensity structure along the high and posterior left frontal lobe which has slowly enlarged since 2007, with mass effect best seen on coronal imaging. In 2007 maximal diameter was 26 mm, currently 37 mm. No internal vessels seen on coronal T2 weighted imaging. There is no  enhancement in this area on previous brain MRI and no calcification on prior head CT. Appearance most consistent with arachnoid cyst. Enlargement could be accentuated by progressive cerebral volume loss, which is generalized and normal for age. Mild small vessel ischemic change for age throughout the bilateral cerebral white matter with remote small vessel infarct in the inferior left cerebellum.  IMPRESSION: 1. No acute finding, including infarct. 2. Age congruent senescent change. 3. ~4 cm arachnoid cyst in the posterior left frontal region with mild growth since 2007.   Electronically Signed   By: Marnee Spring M.D.   On: 10/10/2014 21:17    Assessment & Plan:   Problem List Items Addressed This Visit      Unprioritized   Migraine aura without headache    Increasing in frequency per patient charting and recent change in aura vs  TIA. Recent episode of garbled speech resulting in ER evaluation with head CT followed by MRI brain. Slowly enlarging mass consistent with arachnoid cyst noted,  Otherwise no acute changes.  Did not start topiramate per last visit offer.  Given her risk factors for CVA will start secondary prevention with Plavix until Neurology can see her.  Continue asa anda/w/a/ BP control with maxzide. Adding potassium chloride for mild hypokalemia noted on ER labs.  All imaging studies reviewed with patient.        Lumbago    Her neurology exam is normal.  Physiatry evaluation was done today but patient deferred treatment.  PT referral for core strengthtening exercises in progress.        Relevant Orders   Ambulatory referral to Physical Therapy   Hypokalemia    Diuretic induced,  Will treat for one week.  If persistent at one month followup,  Will change BP medications vs continue daily supplementation       Relevant Orders   Comprehensive metabolic panel   Magnesium   TIA (transient ischemic attack) - Primary    Versus new manifestation of migraine aura.  MRI reviewed  with patient.  TIA risk stratification score is 3% for CVA in next 48 hours, but given remote small vessel infarct noted in the left cerebellum and multiple RFS will add secondary prevention with plavix pending Neurology referral.       Relevant Orders   Ambulatory referral to Neurology   CBC with Differential/Platelet   TSH   Lipid panel    Other Visit Diagnoses    Hyperlipidemia  Screen for STD (sexually transmitted disease)        Relevant Orders    HIV antibody    Hepatitis C antibody    Vitamin D deficiency        Relevant Orders    Vit D  25 hydroxy (rtn osteoporosis monitoring)      A total of 40 minutes was spent with patient more than half of which was spent in counseling patient on the above mentioned issues , reviewing and explaining recent labs and imaging studies done, and coordination of care.  I have discontinued Ms. Bitton's levofloxacin. I am also having her start on clopidogrel and potassium chloride SA. Additionally, I am having her maintain her Lutein, aspirin, calcium carbonate, Vitamin D3, triamterene-hydrochlorothiazide, Cyanocobalamin, MAGNESIUM CITRATE PO, Riboflavin, Co Q 10, ALPRAZolam, pravastatin, topiramate, diazepam, and zolpidem.  Meds ordered this encounter  Medications  . clopidogrel (PLAVIX) 75 MG tablet    Sig: Take 1 tablet (75 mg total) by mouth daily.    Dispense:  90 tablet    Refill:  3  . zolpidem (AMBIEN) 10 MG tablet    Sig: take 1 tablet by mouth at bedtime if needed    Dispense:  30 tablet    Refill:  5    Patient will pay cash  .  Please fill.  Keep on file for future refills  . potassium chloride SA (K-DUR,KLOR-CON) 20 MEQ tablet    Sig: Take 1 tablet (20 mEq total) by mouth daily.    Dispense:  30 tablet    Refill:  0    Medications Discontinued During This Encounter  Medication Reason  . levofloxacin (LEVAQUIN) 500 MG tablet Completed Course  . zolpidem (AMBIEN) 10 MG tablet Reorder    Follow-up: No Follow-up on  file.   Sherlene Shams, MD

## 2014-10-11 NOTE — Progress Notes (Signed)
Pre-visit discussion using our clinic review tool. No additional management support is needed unless otherwise documented below in the visit note.  

## 2014-10-11 NOTE — Patient Instructions (Signed)
I would like you to take Plavix until you see the neurologist.  Clopidogrel tablets What is this medicine? CLOPIDOGREL (kloh PID oh grel) helps to prevent blood clots. This medicine is used to prevent heart attack, stroke, or other vascular events in people who are at high risk. This medicine may be used for other purposes; ask your health care provider or pharmacist if you have questions. COMMON BRAND NAME(S): Plavix What should I tell my health care provider before I take this medicine? They need to know if you have any of the following conditions: -bleeding disorder -bleeding in the brain -planned surgery -stomach or intestinal ulcers -stroke or transient ischemic attack -an unusual or allergic reaction to clopidogrel, other medicines, foods, dyes, or preservatives -pregnant or trying to get pregnant -breast-feeding How should I use this medicine? Take this medicine by mouth with a drink of water. Follow the directions on the prescription label. You may take this medicine with or without food. Take your medicine at regular intervals. Do not take your medicine more often than directed. Talk to your pediatrician regarding the use of this medicine in children. Special care may be needed. Overdosage: If you think you have taken too much of this medicine contact a poison control center or emergency room at once. NOTE: This medicine is only for you. Do not share this medicine with others. What if I miss a dose? If you miss a dose, take it as soon as you can. If it is almost time for your next dose, take only that dose. Do not take double or extra doses. What may interact with this medicine? -aspirin -blood thinners like cilostazol, enoxaparin, ticlopidine, and warfarin -certain medicines for depression like citalopram, fluoxetine, and fluvoxamine -certain medicines for fungal infections like ketoconazole, fluconazole, and voriconazole -certain medicines for HIV infection like delavirdine,  efavirenz, and etravirine -certain medicines for seizures like felbamate, oxcarbazepine, and phenytoin -chloramphenicol -fluvastatin -isoniazid, INH -medicines for inflammation like ibuprofen and naproxen -modafinil -nicardipine -over-the counter supplements like echinacea, feverfew, fish oil, garlic, ginger, ginkgo, green tea, horse chestnut -quinine -stomach acid blockers like cimetidine, omeprazole, and esomeprazole -tamoxifen -tolbutamide -topiramate -torsemide This list may not describe all possible interactions. Give your health care provider a list of all the medicines, herbs, non-prescription drugs, or dietary supplements you use. Also tell them if you smoke, drink alcohol, or use illegal drugs. Some items may interact with your medicine. What should I watch for while using this medicine? Visit your doctor or health care professional for regular check ups. Do not stop taking your medicine unless your doctor tells you to. Notify your doctor or health care professional and seek emergency treatment if you develop breathing problems; changes in vision; chest pain; severe, sudden headache; pain, swelling, warmth in the leg; trouble speaking; sudden numbness or weakness of the face, arm or leg. These can be signs that your condition has gotten worse. If you are going to have surgery or dental work, tell your doctor or health care professional that you are taking this medicine. Certain genetic factors may reduce the effect of this medicine. Your doctor may use genetic tests to determine treatment. What side effects may I notice from receiving this medicine? Side effects that you should report to your doctor or health care professional as soon as possible: -allergic reactions like skin rash, itching or hives, swelling of the face, lips, or tongue -breathing problems -changes in vision -fever -signs and symptoms of bleeding such as bloody or black, tarry stools; red  or dark-brown urine;  spitting up blood or brown material that looks like coffee grounds; red spots on the skin; unusual bruising or bleeding from the eye, gums, or nose -sudden weakness -unusual bleeding or bruising Side effects that usually do not require medical attention (report to your doctor or health care professional if they continue or are bothersome): -constipation or diarrhea -headache -pain in back or joints -stomach upset This list may not describe all possible side effects. Call your doctor for medical advice about side effects. You may report side effects to FDA at 1-800-FDA-1088. Where should I keep my medicine? Keep out of the reach of children. Store at room temperature of 59 to 86 degrees F (15 to 30 degrees C). Throw away any unused medicine after the expiration date. NOTE: This sheet is a summary. It may not cover all possible information. If you have questions about this medicine, talk to your doctor, pharmacist, or health care provider.  2015, [employer] Standard. (2012-05-13 16:34:37)

## 2014-10-12 DIAGNOSIS — X32XXXA Exposure to sunlight, initial encounter: Secondary | ICD-10-CM | POA: Diagnosis not present

## 2014-10-12 DIAGNOSIS — S90111A Contusion of right great toe without damage to nail, initial encounter: Secondary | ICD-10-CM | POA: Diagnosis not present

## 2014-10-12 DIAGNOSIS — E876 Hypokalemia: Secondary | ICD-10-CM | POA: Insufficient documentation

## 2014-10-12 DIAGNOSIS — R202 Paresthesia of skin: Secondary | ICD-10-CM | POA: Diagnosis not present

## 2014-10-12 DIAGNOSIS — B360 Pityriasis versicolor: Secondary | ICD-10-CM | POA: Diagnosis not present

## 2014-10-12 DIAGNOSIS — S90112A Contusion of left great toe without damage to nail, initial encounter: Secondary | ICD-10-CM | POA: Diagnosis not present

## 2014-10-12 DIAGNOSIS — L570 Actinic keratosis: Secondary | ICD-10-CM | POA: Diagnosis not present

## 2014-10-12 DIAGNOSIS — G459 Transient cerebral ischemic attack, unspecified: Secondary | ICD-10-CM | POA: Insufficient documentation

## 2014-10-12 NOTE — Assessment & Plan Note (Addendum)
Increasing in frequency per patient charting and recent change in aura vs  TIA. Recent episode of garbled speech resulting in ER evaluation with head CT followed by MRI brain. Slowly enlarging mass consistent with arachnoid cyst noted,  Otherwise no acute changes.  Did not start topiramate per last visit offer.  Given her risk factors for CVA will start secondary prevention with Plavix until Neurology can see her.  Continue asa anda/w/a/ BP control with maxzide. Adding potassium chloride for mild hypokalemia noted on ER labs.  All imaging studies reviewed with patient.

## 2014-10-12 NOTE — Assessment & Plan Note (Signed)
Versus new manifestation of migraine aura.  MRI reviewed with patient.  TIA risk stratification score is 3% for CVA in next 48 hours, but given remote small vessel infarct noted in the left cerebellum and multiple RFS will add secondary prevention with plavix pending Neurology referral.

## 2014-10-12 NOTE — Assessment & Plan Note (Signed)
Diuretic induced,  Will treat for one week.  If persistent at one month followup,  Will change BP medications vs continue daily supplementation

## 2014-10-12 NOTE — Assessment & Plan Note (Signed)
Her neurology exam is normal.  Physiatry evaluation was done today but patient deferred treatment.  PT referral for core strengthtening exercises in progress.

## 2014-10-25 ENCOUNTER — Encounter: Payer: Self-pay | Admitting: Neurology

## 2014-10-25 ENCOUNTER — Ambulatory Visit (INDEPENDENT_AMBULATORY_CARE_PROVIDER_SITE_OTHER): Payer: Medicare PPO | Admitting: Neurology

## 2014-10-25 VITALS — BP 142/78 | HR 92 | Ht 64.0 in | Wt 146.0 lb

## 2014-10-25 DIAGNOSIS — G930 Cerebral cysts: Secondary | ICD-10-CM

## 2014-10-25 DIAGNOSIS — R4701 Aphasia: Secondary | ICD-10-CM

## 2014-10-25 DIAGNOSIS — G43109 Migraine with aura, not intractable, without status migrainosus: Secondary | ICD-10-CM

## 2014-10-25 NOTE — Patient Instructions (Signed)
May increase riboflavin to 100 mg twice a day  Magnesium citrate to 400 mg twice a day

## 2014-10-25 NOTE — Progress Notes (Signed)
PATIENT: Laura [name] DOB: 14-Mar-1932  Chief Complaint  Patient presents with  . TIA vs Migraine    Says she had episode in which she experienced aphasia.  Says she has a history of migraine auras in which her speech has been garbled in the past.  She went to the ED for evaluation and had a work up that was negative for stroke,     HISTORICAL  Laura [name] is a 81 years old left-handed female, accompanied by her daughter, seen in refer by her primary care physician Dr. Duncan Dull for evaluation of frequent migraines  She had her first migraine in 1963-06[birth year]07 during pregnancy, presented with aphasia followed by severe pounding headaches, for a while she stopped to have migraine,  Then since Jun 06, 1973, she began to have her stereotypical migraine headaches, usually started with a sparkling dots in her left visual field, then spreading become a bright yellow C-shaped circle, gradually moving out of her left temporal visual field within 30 minutes, she felt mild achiness in her head, but no severe headaches, she is used to have similar episodes 5-6 each year,  Common trigger for her migraines are bright light, stress,  Her husband passed away in 06/06/12, she is still in grave,  since January 2016, she began to have recurrent migraine aura without headaches but with much increased frequency, 1-4 episodes each months, in October 10 2014, she experienced one episode started with sparkling visual disturbance in her left visual field, but this time moved towards her nasal field then right temporal region, lasting for 15 minutes, then she noticed word finding difficulty lasting for a few more minutes, no loss of consciousness, no facial asymmetry, no right weakness, sensory loss, no significant headaches,  She was sent to the emergency room the same day, I have reviewed with patient MRI of the brain October 10 2014, mild generalized atrophy, small vessel disease, left frontal arachnoid cyst, may  slightly increased in size  REVIEW OF SYSTEMS: Full 14 system review of systems performed and notable only for as above anemia, restless legs  ALLERGIES: Allergies  Allergen Reactions  . Erythromycin     cramping    HOME MEDICATIONS: Current Outpatient Prescriptions  Medication Sig Dispense Refill  . ALPRAZolam (XANAX) 0.5 MG tablet Take 1 tablet (0.5 mg total) by mouth at bedtime as needed for sleep. 30 tablet 3  . calcium carbonate (OS-CAL) 600 MG TABS Take 600 mg by mouth 2 (two) times daily with a meal.    . Cholecalciferol (VITAMIN D3) 1000 UNITS CAPS Take one by mouth twice a day.    . clopidogrel (PLAVIX) 75 MG tablet Take 1 tablet (75 mg total) by mouth daily. 90 tablet 3  . Coenzyme Q10 (CO Q 10) 100 MG CAPS Take 1 capsule by mouth daily.    . Cyanocobalamin 2500 MCG SUBL Place 1 tablet (2,500 mcg total) under the tongue daily. 90 tablet 2  . Lutein 20 MG TABS Take one by mouth daily    . MAGNESIUM CITRATE PO Take 200 mg by mouth daily.    . potassium chloride SA (K-DUR,KLOR-CON) 20 MEQ tablet Take 1 tablet (20 mEq total) by mouth daily. 30 tablet 0  . pravastatin (PRAVACHOL) 40 MG tablet take 1 tablet by mouth once daily 90 tablet 1  . Riboflavin 100 MG CAPS Take 1 capsule by mouth daily.    Marland Kitchen triamterene-hydrochlorothiazide (MAXZIDE-25) 37.5-25 MG per tablet Take 1 tablet by mouth daily. 90 tablet 1  .  zolpidem (AMBIEN) 10 MG tablet take 1 tablet by mouth at bedtime if needed 30 tablet 5   No current facility-administered medications for this visit.    PAST MEDICAL HISTORY: Past Medical History  Diagnosis Date  . Osteoporosis   . Migraine with aura   . High cholesterol     PAST SURGICAL HISTORY: Past Surgical History  Procedure Laterality Date  . Tubal ligation      at age 32, vaginal complicated by peritonitis  . Abdominal hysterectomy      secondary to peritonitis and complications   . Tonsillectomy    . Appendectomy      FAMILY HISTORY: Family History    Problem Relation Age of Onset  . Heart disease Mother   . Breast cancer Mother 75  . Breast cancer Sister 65    SOCIAL HISTORY:  Social History   Social History  . Marital Status: Widowed    Spouse Name: N/A  . Number of Children: 2  . Years of Education: 18   Occupational History  . Retired    Social History Main Topics  . Smoking status: Former Smoker    Quit date: 03/20/1981  . Smokeless tobacco: Never Used  . Alcohol Use: 8.4 oz/week    14 Glasses of wine per week     Comment: 2 glasses of wine per day.  . Drug Use: No  . Sexual Activity: Not on file   Other Topics Concern  . Not on file   Social History Narrative   Lives at home alone.   Left-handed.   2-4 cups caffeine per day.     PHYSICAL EXAM   Filed Vitals:   10/25/14 1347  BP: 142/78  Pulse: 92  Height:  (1.626 m)  Weight: 146 lb (66.225 kg)    Not recorded      Body mass index is 25.05 kg/(m^2).  PHYSICAL EXAMNIATION:  Gen: NAD, conversant, well nourised, obese, well groomed                     Cardiovascular: Regular rate rhythm, no peripheral edema, warm, nontender. Eyes: Conjunctivae clear without exudates or hemorrhage Neck: Supple, no carotid bruise. Pulmonary: Clear to auscultation bilaterally   NEUROLOGICAL EXAM:  MENTAL STATUS: Speech:    Speech is normal; fluent and spontaneous with normal comprehension.  Cognition:     Orientation to time, place and person     Normal recent and remote memory     Normal Attention span and concentration     Normal Language, naming, repeating,spontaneous speech     Fund of knowledge   CRANIAL NERVES: CN II: Visual fields are full to confrontation. Fundoscopic exam is normal with sharp discs and no vascular changes. Pupils are round equal and briskly reactive to light. CN III, IV, VI: extraocular movement are normal. No ptosis. CN V: Facial sensation is intact to pinprick in all 3 divisions bilaterally. Corneal responses are intact.   CN VII: Face is symmetric with normal eye closure and smile. CN VIII: Hearing is normal to rubbing fingers CN IX, X: Palate elevates symmetrically. Phonation is normal. CN XI: Head turning and shoulder shrug are intact CN XII: Tongue is midline with normal movements and no atrophy.  MOTOR: There is no pronator drift of out-stretched arms. Muscle bulk and tone are normal. Muscle strength is normal.  REFLEXES: Reflexes are 2+ and symmetric at the biceps, triceps, knees, and ankles. Plantar responses are flexor.  SENSORY: Intact to light  touch, pinprick, position sense, and vibration sense are intact in fingers and toes.  COORDINATION: Rapid alternating movements and fine finger movements are intact. There is no dysmetria on finger-to-nose and heel-knee-shin.    GAIT/STANCE: Posture is normal. Gait is steady with normal steps, base, arm swing, and turning. Heel and toe walking are normal. Tandem gait is normal.  Romberg is absent.   DIAGNOSTIC DATA (LABS, IMAGING, TESTING) - I reviewed patient records, labs, notes, testing and imaging myself where available.   ASSESSMENT AND PLAN  Laura [name] is a 81 y.o. female   migraine with aura  Continue daily aspirin  Stop Plavix  Remote possibility of TIA, proceed with ultrasound of carotid artery  Increased riboflavin to 100 mg twice a day, magnesium oxide 400 mg twice a day   Left frontal arachnoid cyst  Levert Feinstein, M.D. Ph.D.  [facility] [address], [address] [place], [place] [address] Ph: [phone] Fax: [phone]  CC: Sherlene Shams, MD

## 2014-10-27 ENCOUNTER — Encounter: Payer: Self-pay | Admitting: *Deleted

## 2014-10-27 ENCOUNTER — Encounter: Payer: Self-pay | Admitting: Internal Medicine

## 2014-10-27 ENCOUNTER — Ambulatory Visit (INDEPENDENT_AMBULATORY_CARE_PROVIDER_SITE_OTHER): Payer: Medicare PPO | Admitting: Internal Medicine

## 2014-10-27 VITALS — BP 118/78 | HR 81 | Temp 98.1°F | Resp 12 | Ht 65.0 in | Wt 147.1 lb

## 2014-10-27 DIAGNOSIS — G459 Transient cerebral ischemic attack, unspecified: Secondary | ICD-10-CM | POA: Diagnosis not present

## 2014-10-27 DIAGNOSIS — Z0000 Encounter for general adult medical examination without abnormal findings: Secondary | ICD-10-CM | POA: Diagnosis not present

## 2014-10-27 DIAGNOSIS — Z79899 Other long term (current) drug therapy: Secondary | ICD-10-CM | POA: Diagnosis not present

## 2014-10-27 DIAGNOSIS — Z113 Encounter for screening for infections with a predominantly sexual mode of transmission: Secondary | ICD-10-CM | POA: Diagnosis not present

## 2014-10-27 DIAGNOSIS — Z23 Encounter for immunization: Secondary | ICD-10-CM | POA: Diagnosis not present

## 2014-10-27 DIAGNOSIS — E876 Hypokalemia: Secondary | ICD-10-CM | POA: Diagnosis not present

## 2014-10-27 DIAGNOSIS — E559 Vitamin D deficiency, unspecified: Secondary | ICD-10-CM

## 2014-10-27 DIAGNOSIS — G43109 Migraine with aura, not intractable, without status migrainosus: Secondary | ICD-10-CM

## 2014-10-27 LAB — COMPREHENSIVE METABOLIC PANEL
ALK PHOS: 45 U/L (ref 39–117)
ALT: 12 U/L (ref 0–35)
AST: 14 U/L (ref 0–37)
Albumin: 4.5 g/dL (ref 3.5–5.2)
BUN: 14 mg/dL (ref 6–23)
CHLORIDE: 102 meq/L (ref 96–112)
CO2: 31 mEq/L (ref 19–32)
Calcium: 9.7 mg/dL (ref 8.4–10.5)
Creatinine, Ser: 0.67 mg/dL (ref 0.40–1.20)
GFR: 90.02 mL/min (ref >60.00)
GLUCOSE: 73 mg/dL (ref 70–99)
POTASSIUM: 4.4 meq/L (ref 3.5–5.1)
SODIUM: 139 meq/L (ref 135–145)
Total Bilirubin: 0.4 mg/dL (ref 0.2–1.2)
Total Protein: 7 g/dL (ref 6.0–8.3)

## 2014-10-27 LAB — CBC WITH DIFFERENTIAL/PLATELET
BASOS PCT: 0.4 % (ref 0.0–3.0)
Basophils Absolute: 0 10*3/uL (ref 0.0–0.1)
EOS PCT: 4 % (ref 0.0–5.0)
Eosinophils Absolute: 0.3 10*3/uL (ref 0.0–0.7)
HCT: 42.5 % (ref 36.0–46.0)
Hemoglobin: 14.3 g/dL (ref 12.0–15.0)
LYMPHS ABS: 2.2 10*3/uL (ref 0.7–4.0)
Lymphocytes Relative: 30.1 % (ref 12.0–46.0)
MCHC: 33.8 g/dL (ref 30.0–36.0)
MCV: 89 fl (ref 78.0–100.0)
MONO ABS: 0.5 10*3/uL (ref 0.1–1.0)
MONOS PCT: 6.6 % (ref 3.0–12.0)
NEUTROS ABS: 4.3 10*3/uL (ref 1.4–7.7)
NEUTROS PCT: 58.9 % (ref 43.0–77.0)
PLATELETS: 302 10*3/uL (ref 150.0–400.0)
RBC: 4.77 Mil/uL (ref 3.87–5.11)
RDW: 13 % (ref 11.5–15.5)
WBC: 7.3 10*3/uL (ref 4.0–10.5)

## 2014-10-27 LAB — VITAMIN D 25 HYDROXY (VIT D DEFICIENCY, FRACTURES): VITD: 41.53 ng/mL (ref 30.00–100.00)

## 2014-10-27 LAB — TSH: TSH: 2.16 u[IU]/mL (ref 0.35–4.50)

## 2014-10-27 LAB — MAGNESIUM: MAGNESIUM: 2.1 mg/dL (ref 1.5–2.5)

## 2014-10-27 NOTE — Progress Notes (Signed)
Pre-visit discussion using our clinic review tool. No additional management support is needed unless otherwise documented below in the visit note.  

## 2014-10-27 NOTE — Progress Notes (Signed)
Patient ID: Laura [name], female    DOB: Jan 30, 1932  Age: 81 y.o. MRN: [id number]  The patient is here for annual Medicare wellness examination and management of other chronic and acute problems.  The risk factors are reflected in the social history.  The roster of all physicians providing medical care to patient - is listed in the Snapshot section of the chart.  Activities of daily living:  The patient is 100% independent in all ADLs: dressing, toileting, feeding as well as independent mobility  Home safety : The patient has smoke detectors in the home. They wear seatbelts.  There are no firearms at home. There is no violence in the home.   There is no risks for hepatitis, STDs or HIV. There is no   history of blood transfusion. They have no travel history to infectious disease endemic areas of the world.  The patient has seen their dentist in the last six month. They have seen their eye doctor in the last year. They admit to slight hearing difficulty with regard to whispered voices and some television programs.  They have deferred audiologic testing in the last year.  They do not  have excessive sun exposure. Discussed the need for sun protection: hats, long sleeves and use of sunscreen if there is significant sun exposure.   Diet: the importance of a healthy diet is discussed. They do have a healthy diet.  The benefits of regular aerobic exercise were discussed. She walks 4 times per week ,  20 minutes.   Depression screen: there are no signs or vegative symptoms of depression- irritability, change in appetite, anhedonia, sadness/tearfullness.  Cognitive assessment: the patient manages all their financial and personal affairs and is actively engaged. They could relate day,date,year and events; recalled 2/3 objects at 3 minutes; performed clock-face test normally.  The following portions of the patient's history were reviewed and updated as appropriate: allergies, current medications,  past family history, past medical history,  past surgical history, past social history  and problem list.  Visual acuity was not assessed per patient preference since she has regular follow up with her ophthalmologist. Hearing and body mass index were assessed and reviewed.   During the course of the visit the patient was educated and counseled about appropriate screening and preventive services including : fall prevention , diabetes screening, nutrition counseling, colorectal cancer screening, and recommended immunizations.    CC: The primary encounter diagnosis was Encounter for immunization. Diagnoses of Vitamin D deficiency, Screen for STD (sexually transmitted disease), Hypokalemia, Transient cerebral ischemia, unspecified transient cerebral ischemia type, Encounter for Medicare annual wellness exam, and Migraine with aura and without status migrainosus, not intractable were also pertinent to this visit. Since her recent visit she has seen neurology regarding the episode of migraine aura versus TIA. Neurology has reassured her that her transient neurologic symptoms were new onset and not TIA. She has discontinued her Plavix at their advice. She has resumed magnesium oxide and riboflavin and her doses have been adjusted by neurology.   Neurology has quadrupled the magnesium and riboflavin for migraine.   History [place] has a past medical history of Osteoporosis; Migraine with aura; and High cholesterol.   She has past surgical history that includes Tubal ligation; Abdominal hysterectomy; Tonsillectomy; and Appendectomy.   Her family history includes Breast cancer (age of onset: 67) in her sister; Breast cancer (age of onset: 39) in her mother; Heart disease in her mother.She reports that she quit smoking about 33 years ago. She  has never used smokeless tobacco. She reports that she drinks about 8.4 oz of alcohol per week. She reports that she does not use illicit drugs.  Outpatient  Prescriptions Prior to Visit  Medication Sig Dispense Refill  . ALPRAZolam (XANAX) 0.5 MG tablet Take 1 tablet (0.5 mg total) by mouth at bedtime as needed for sleep. 30 tablet 3  . aspirin EC 81 MG tablet Take 81 mg by mouth daily.    . calcium carbonate (OS-CAL) 600 MG TABS Take 600 mg by mouth 2 (two) times daily with a meal.    . Cholecalciferol (VITAMIN D3) 1000 UNITS CAPS Take one by mouth twice a day.    . Coenzyme Q10 (CO Q 10) 100 MG CAPS Take 1 capsule by mouth daily.    . Cyanocobalamin 2500 MCG SUBL Place 1 tablet (2,500 mcg total) under the tongue daily. 90 tablet 2  . Lutein 20 MG TABS Take one by mouth daily    . MAGNESIUM CITRATE PO Take 400 mg by mouth 2 (two) times daily.     . potassium chloride SA (K-DUR,KLOR-CON) 20 MEQ tablet Take 1 tablet (20 mEq total) by mouth daily. 30 tablet 0  . pravastatin (PRAVACHOL) 40 MG tablet take 1 tablet by mouth once daily 90 tablet 1  . Riboflavin 100 MG CAPS Take 1 capsule by mouth 2 (two) times daily.     Marland Kitchen triamterene-hydrochlorothiazide (MAXZIDE-25) 37.5-25 MG per tablet Take 1 tablet by mouth daily. (Patient taking differently: Take 0.5 tablets by mouth daily. ) 90 tablet 1  . zolpidem (AMBIEN) 10 MG tablet take 1 tablet by mouth at bedtime if needed 30 tablet 5  . clopidogrel (PLAVIX) 75 MG tablet Take 1 tablet (75 mg total) by mouth daily. (Patient not taking: Reported on 10/27/2014) 90 tablet 3  . zolpidem (AMBIEN) 10 MG tablet take 1 tablet by mouth at bedtime if needed 30 tablet 5   No facility-administered medications prior to visit.    Review of Systems  Patient denies headache, fevers, malaise, unintentional weight loss, skin rash, eye pain, sinus congestion and sinus pain, sore throat, dysphagia,  hemoptysis , cough, dyspnea, wheezing, chest pain, palpitations, orthopnea, edema, abdominal pain, nausea, melena, diarrhea, constipation, flank pain, dysuria, hematuria, urinary  Frequency, nocturia, numbness, tingling, seizures,   Focal weakness, Loss of consciousness,  Tremor, insomnia, depression, anxiety, and suicidal ideation.     Objective:  BP 118/78 mmHg  Pulse 81  Temp(Src) 98.1 F (36.7 C) (Oral)  Resp 12  Ht  (1.651 m)  Wt 147 lb 2 oz (66.735 kg)  BMI 24.48 kg/m2  SpO2 98%  Physical Exam   General appearance: alert, cooperative and appears stated age Head: Normocephalic, without obvious abnormality, atraumatic Eyes: conjunctivae/corneas clear. PERRL, EOM's intact. Fundi benign. Ears: normal TM's and external ear canals both ears Nose: Nares normal. Septum midline. Mucosa normal. No drainage or sinus tenderness. Throat: lips, mucosa, and tongue normal; teeth and gums normal Neck: no adenopathy, no carotid bruit, no JVD, supple, symmetrical, trachea midline and thyroid not enlarged, symmetric, no tenderness/mass/nodules Lungs: clear to auscultation bilaterally Breasts: normal appearance, no masses or tenderness Heart: regular rate and rhythm, S1, S2 normal, no murmur, click, rub or gallop Abdomen: soft, non-tender; bowel sounds normal; no masses,  no organomegaly Extremities: extremities normal, atraumatic, no cyanosis or edema Pulses: 2+ and symmetric Skin: Skin color, texture, turgor normal. No rashes or lesions Neurologic: Alert and oriented X 3, normal strength and tone. Normal symmetric reflexes. Normal  coordination and gait.     Assessment & Plan:   Problem List Items Addressed This Visit    Encounter for Medicare annual wellness exam    Annual Medicare wellness  exam was done as well as a comprehensive physical exam and management of acute and chronic conditions .  During the course of the visit the patient was educated and counseled about appropriate screening and preventive services including : fall prevention , diabetes screening, nutrition counseling, colorectal cancer screening, and recommended immunizations.  Printed recommendations for health maintenance screenings was given.        Hypokalemia    Diuretic induced,  Has been taking oral potassium 20 meQ daily since sept 13th .potassium is normal today. We'll discontinue supplement. Recheck in 3 months.  Lab Results  Component Value Date   NA 139 10/27/2014   K 4.4 10/27/2014   CL 102 10/27/2014   CO2 31 10/27/2014         Migraine with aura and without status migrainosus, not intractable    She has seen neurology and her dose of magnesium oxide has been increased to 400 mg twice daily along with riboflavin 100 mg twice daily.      TIA (transient ischemic attack)    Other Visit Diagnoses    Encounter for immunization    -  Primary    Vitamin D deficiency        Screen for STD (sexually transmitted disease)           I have discontinued Ms. Ellery's clopidogrel. I am also having her maintain her Lutein, calcium carbonate, Vitamin D3, triamterene-hydrochlorothiazide, Cyanocobalamin, MAGNESIUM CITRATE PO, Riboflavin, Co Q 10, ALPRAZolam, pravastatin, zolpidem, potassium chloride SA, aspirin EC, and denosumab.  Meds ordered this encounter  Medications  . denosumab (PROLIA) 60 MG/ML SOLN injection    Sig: Inject 60 mg into the skin every 6 (six) months. Administer in upper arm, thigh, or abdomen    Medications Discontinued During This Encounter  Medication Reason  . clopidogrel (PLAVIX) 75 MG tablet Completed Course    Follow-up: No Follow-up on file.   Sherlene Shams, MD

## 2014-10-27 NOTE — Assessment & Plan Note (Signed)

## 2014-10-27 NOTE — Assessment & Plan Note (Signed)
Diuretic induced,  Has been taking oral potassium 20 meQ daily since sept 13th .potassium is normal today. We'll discontinue supplement. Recheck in 3 months.  Lab Results  Component Value Date   NA 139 10/27/2014   K 4.4 10/27/2014   CL 102 10/27/2014   CO2 31 10/27/2014

## 2014-10-27 NOTE — Assessment & Plan Note (Signed)
She has seen neurology and her dose of magnesium oxide has been increased to 400 mg twice daily along with riboflavin 100 mg twice daily.

## 2014-10-27 NOTE — Patient Instructions (Addendum)
Magnesium OXIDE,   NOT CITRATE!!!!  MAG CITRATE IS A CATHARTIC LAXATIVE!    Health Maintenance Adopting a healthy lifestyle and getting preventive care can go a long way to promote health and wellness. Talk with your health care provider about what schedule of regular examinations is right for you. This is a good chance for you to check in with your provider about disease prevention and staying healthy. In between checkups, there are plenty of things you can do on your own. Experts have done a lot of research about which lifestyle changes and preventive measures are most likely to keep you healthy. Ask your health care provider for more information. WEIGHT AND DIET  Eat a healthy diet  Be sure to include plenty of vegetables, fruits, low-fat dairy products, and lean protein.  Do not eat a lot of foods high in solid fats, added sugars, or salt.  Get regular exercise. This is one of the most important things you can do for your health.  Most adults should exercise for at least 150 minutes each week. The exercise should increase your heart rate and make you sweat (moderate-intensity exercise).  Most adults should also do strengthening exercises at least twice a week. This is in addition to the moderate-intensity exercise.  Maintain a healthy weight  Body mass index (BMI) is a measurement that can be used to identify possible weight problems. It estimates body fat based on height and weight. Your health care provider can help determine your BMI and help you achieve or maintain a healthy weight.  For females 73 years of age and older:   A BMI below 18.5 is considered underweight.  A BMI of 18.5 to 24.9 is normal.  A BMI of 25 to 29.9 is considered overweight.  A BMI of 30 and above is considered obese.  Watch levels of cholesterol and blood lipids  You should start having your blood tested for lipids and cholesterol at 79 years of age, then have this test every 5 years.  You may need  to have your cholesterol levels checked more often if:  Your lipid or cholesterol levels are high.  You are older than 79 years of age.  You are at high risk for heart disease.  CANCER SCREENING   Lung Cancer  Lung cancer screening is recommended for adults 79-21 years old who are at high risk for lung cancer because of a history of smoking.  A yearly low-dose CT scan of the lungs is recommended for people who:  Currently smoke.  Have quit within the past 15 years.  Have at least a 30-pack-year history of smoking. A pack year is smoking an average of one pack of cigarettes a day for 1 year.  Yearly screening should continue until it has been 15 years since you quit.  Yearly screening should stop if you develop a health problem that would prevent you from having lung cancer treatment.  Breast Cancer  Practice breast self-awareness. This means understanding how your breasts normally appear and feel.  It also means doing regular breast self-exams. Let your health care provider know about any changes, no matter how small.  If you are in your 20s or 30s, you should have a clinical breast exam (CBE) by a health care provider every 1-3 years as part of a regular health exam.  If you are 58 or older, have a CBE every year. Also consider having a breast X-ray (mammogram) every year.  If you have a family history  of breast cancer, talk to your health care provider about genetic screening.  If you are at high risk for breast cancer, talk to your health care provider about having an MRI and a mammogram every year.  Breast cancer gene (BRCA) assessment is recommended for women who have family members with BRCA-related cancers. BRCA-related cancers include:  Breast.  Ovarian.  Tubal.  Peritoneal cancers.  Results of the assessment will determine the need for genetic counseling and BRCA1 and BRCA2 testing. Cervical Cancer Routine pelvic examinations to screen for cervical cancer  are no longer recommended for nonpregnant women who are considered low risk for cancer of the pelvic organs (ovaries, uterus, and vagina) and who do not have symptoms. A pelvic examination may be necessary if you have symptoms including those associated with pelvic infections. Ask your health care provider if a screening pelvic exam is right for you.   The Pap test is the screening test for cervical cancer for women who are considered at risk.  If you had a hysterectomy for a problem that was not cancer or a condition that could lead to cancer, then you no longer need Pap tests.  If you are older than 65 years, and you have had normal Pap tests for the past 10 years, you no longer need to have Pap tests.  If you have had past treatment for cervical cancer or a condition that could lead to cancer, you need Pap tests and screening for cancer for at least 20 years after your treatment.  If you no longer get a Pap test, assess your risk factors if they change (such as having a new sexual partner). This can affect whether you should start being screened again.  Some women have medical problems that increase their chance of getting cervical cancer. If this is the case for you, your health care provider may recommend more frequent screening and Pap tests.  The human papillomavirus (HPV) test is another test that may be used for cervical cancer screening. The HPV test looks for the virus that can cause cell changes in the cervix. The cells collected during the Pap test can be tested for HPV.  The HPV test can be used to screen women 18 years of age and older. Getting tested for HPV can extend the interval between normal Pap tests from three to five years.  An HPV test also should be used to screen women of any age who have unclear Pap test results.  After 79 years of age, women should have HPV testing as often as Pap tests.  Colorectal Cancer  This type of cancer can be detected and often  prevented.  Routine colorectal cancer screening usually begins at 79 years of age and continues through 79 years of age.  Your health care provider may recommend screening at an earlier age if you have risk factors for colon cancer.  Your health care provider may also recommend using home test kits to check for hidden blood in the stool.  A small camera at the end of a tube can be used to examine your colon directly (sigmoidoscopy or colonoscopy). This is done to check for the earliest forms of colorectal cancer.  Routine screening usually begins at age 74.  Direct examination of the colon should be repeated every 5-10 years through 79 years of age. However, you may need to be screened more often if early forms of precancerous polyps or small growths are found. Skin Cancer  Check your skin from head  to toe regularly.  Tell your health care provider about any new moles or changes in moles, especially if there is a change in a mole's shape or color.  Also tell your health care provider if you have a mole that is larger than the size of a pencil eraser.  Always use sunscreen. Apply sunscreen liberally and repeatedly throughout the day.  Protect yourself by wearing long sleeves, pants, a wide-brimmed hat, and sunglasses whenever you are outside. HEART DISEASE, DIABETES, AND HIGH BLOOD PRESSURE   Have your blood pressure checked at least every 1-2 years. High blood pressure causes heart disease and increases the risk of stroke.  If you are between 26 years and 89 years old, ask your health care provider if you should take aspirin to prevent strokes.  Have regular diabetes screenings. This involves taking a blood sample to check your fasting blood sugar level.  If you are at a normal weight and have a low risk for diabetes, have this test once every three years after 79 years of age.  If you are overweight and have a high risk for diabetes, consider being tested at a younger age or more  often. PREVENTING INFECTION  Hepatitis B  If you have a higher risk for hepatitis B, you should be screened for this virus. You are considered at high risk for hepatitis B if:  You were born in a country where hepatitis B is common. Ask your health care provider which countries are considered high risk.  Your parents were born in a high-risk country, and you have not been immunized against hepatitis B (hepatitis B vaccine).  You have HIV or AIDS.  You use needles to inject street drugs.  You live with someone who has hepatitis B.  You have had sex with someone who has hepatitis B.  You get hemodialysis treatment.  You take certain medicines for conditions, including cancer, organ transplantation, and autoimmune conditions. Hepatitis C  Blood testing is recommended for:  Everyone born from [birth year] through 1963.  Anyone with known risk factors for hepatitis C. Sexually transmitted infections (STIs)  You should be screened for sexually transmitted infections (STIs) including gonorrhea and chlamydia if:  You are sexually active and are younger than 79 years of age.  You are older than 79 years of age and your health care provider tells you that you are at risk for this type of infection.  Your sexual activity has changed since you were last screened and you are at an increased risk for chlamydia or gonorrhea. Ask your health care provider if you are at risk.  If you do not have HIV, but are at risk, it may be recommended that you take a prescription medicine daily to prevent HIV infection. This is called pre-exposure prophylaxis (PrEP). You are considered at risk if:  You are sexually active and do not regularly use condoms or know the HIV status of your partner(s).  You take drugs by injection.  You are sexually active with a partner who has HIV. Talk with your health care provider about whether you are at high risk of being infected with HIV. If you choose to begin PrEP, you  should first be tested for HIV. You should then be tested every 3 months for as long as you are taking PrEP.  PREGNANCY   If you are premenopausal and you may become pregnant, ask your health care provider about preconception counseling.  If you may become pregnant, take 400 to 800 micrograms (  mcg) of folic acid every day.  If you want to prevent pregnancy, talk to your health care provider about birth control (contraception). OSTEOPOROSIS AND MENOPAUSE   Osteoporosis is a disease in which the bones lose minerals and strength with aging. This can result in serious bone fractures. Your risk for osteoporosis can be identified using a bone density scan.  If you are 73 years of age or older, or if you are at risk for osteoporosis and fractures, ask your health care provider if you should be screened.  Ask your health care provider whether you should take a calcium or vitamin D supplement to lower your risk for osteoporosis.  Menopause may have certain physical symptoms and risks.  Hormone replacement therapy may reduce some of these symptoms and risks. Talk to your health care provider about whether hormone replacement therapy is right for you.  HOME CARE INSTRUCTIONS   Schedule regular health, dental, and eye exams.  Stay current with your immunizations.   Do not use any tobacco products including cigarettes, chewing tobacco, or electronic cigarettes.  If you are pregnant, do not drink alcohol.  If you are breastfeeding, limit how much and how often you drink alcohol.  Limit alcohol intake to no more than 1 drink per day for nonpregnant women. One drink equals 12 ounces of beer, 5 ounces of wine, or 1 ounces of hard liquor.  Do not use street drugs.  Do not share needles.  Ask your health care provider for help if you need support or information about quitting drugs.  Tell your health care provider if you often feel depressed.  Tell your health care provider if you have ever  been abused or do not feel safe at home. Document Released: 07/31/2010 Document Revised: 06/01/2013 Document Reviewed: 12/17/2012 [facility] Patient Information 2015 [place], [place]. This information is not intended to replace advice given to you by your health care provider. Make sure you discuss any questions you have with your health care provider.

## 2014-10-28 LAB — HIV ANTIBODY (ROUTINE TESTING W REFLEX): HIV 1&2 Ab, 4th Generation: NONREACTIVE

## 2014-10-28 LAB — HEPATITIS C ANTIBODY: HCV Ab: NEGATIVE

## 2014-10-31 ENCOUNTER — Encounter: Payer: Self-pay | Admitting: Internal Medicine

## 2014-11-01 ENCOUNTER — Ambulatory Visit: Payer: Medicare PPO | Attending: Internal Medicine

## 2014-11-01 DIAGNOSIS — R531 Weakness: Secondary | ICD-10-CM | POA: Diagnosis not present

## 2014-11-01 DIAGNOSIS — M545 Low back pain, unspecified: Secondary | ICD-10-CM

## 2014-11-01 DIAGNOSIS — G8929 Other chronic pain: Secondary | ICD-10-CM | POA: Diagnosis not present

## 2014-11-01 NOTE — Patient Instructions (Signed)
Gave: sitting with lumbar towel roll (when sitting),  Prone on elbows 2 min x3 daily,  Prone R hip flexion isometrics with R LE propped onto 2 pillows 10x3 with 5 second holds,  Supine hamstring stretch   As part her HEP. Pt demonstrated and verbalized understanding.

## 2014-11-01 NOTE — Therapy (Signed)
[facility] [address], [place], [address] Phone: [phone]   Fax:  [phone]  Physical Therapy Evaluation  Patient Details  Name: Laura [name] MRN: [id number] Date of Birth: May 19, 1932 Referring Provider:  Sherlene Shams, MD  Encounter Date: 11/01/2014      PT End of Session - 11/01/14 1604    Visit Number 1   Number of Visits 9   Date for PT Re-Evaluation 12/02/14   Authorization Type 1   Authorization Time Period of 10   PT Start Time 1604   PT Stop Time 1724   PT Time Calculation (min) 80 min   Activity Tolerance Patient tolerated treatment well;No increased pain   Behavior During Therapy [facility] for tasks assessed/performed      Past Medical History  Diagnosis Date  . Osteoporosis   . Migraine with aura   . High cholesterol   . Hypertension     Pt states that her blood pressure is very well controlled    Past Surgical History  Procedure Laterality Date  . Tubal ligation      at age 63, vaginal complicated by peritonitis  . Abdominal hysterectomy      secondary to peritonitis and complications   . Tonsillectomy    . Appendectomy      There were no vitals filed for this visit.  Visit Diagnosis:  Chronic midline low back pain without sciatica - Plan: PT plan of care cert/re-cert  Right low back pain, with sciatica presence unspecified - Plan: PT plan of care cert/re-cert  Weakness - Plan: PT plan of care cert/re-cert      Subjective Assessment - 11/01/14 1610    Subjective R low back pain 3/10 currently, 5/10 at worst for the past 3 weeks (able to alleviate pain with standing up and moving). No R scapular pain currently.    Pertinent History Pt states that her back is better right now than it has been since last month. Pt however states that she feels like she needs someone to show her what to do. Pt states that her back pain is mosltly on her R side. Pain locations: R glute, R  low back, R shoulder blade. Pt states that she has been having low back pain since 15 years ago. Pt states that her daughter is a physical therapist who works on her at times but is busy and lives a little far away. Pt states that her symptoms were aggravated when she went to [facility] and drove in the car for 8 hours each way to and from [place] during the middle of July 2016 and would have to stand and walk around to help alleviate her symptoms. Pt also went to [place] at the end of July 2016 and ended up sitting for 12 hours.  Pt also adds that she likes to dance, walk, perform yoga, and also has osteoporosis.    Patient Stated Goals Pt expresses desire to get better. Increase flexibility, lengthen gluteus maximus, do something so that her back does not hurt all the time.    Currently in Pain? Yes   Pain Score 3    Aggravating Factors  Walking on hard surfaces, sitting for prolonged periods (20 min to 2 hours).    Multiple Pain Sites Yes  R shoulder blade, R glute, R low back            [facility] PT Assessment - 11/01/14 1628    Assessment  Medical Diagnosis Midline low back pain without sciatica   Onset Date/Surgical Date 08/13/14  Mid July. Pt did not provide specific date.    Hand Dominance Left   Prior Therapy No known physical therapy for current problem   Precautions   Precaution Comments no known precautions   Restrictions   Other Position/Activity Restrictions no known restrictions   Balance Screen   Has the patient fallen in the past 6 months No   Has the patient had a decrease in activity level because of a fear of falling?  No   Is the patient reluctant to leave their home because of a fear of falling?  No   Prior Function   Vocation Retired   [employer] PLOF: no problem sitting, stretching, performing yoga   Observation/Other Assessments   Observations Pt states that she had a fall last year September 2015, had an X-ray for low back which did not reveal  any fractures. (-) Slump test, (-) Ober's test R hip. (-) Piriformis test R LE. Long sit test suggests posterior nutation of R innominate.    Modified Oswertry 28% (moderate disability)   Posture/Postural Control   Posture Comments bilaterally protracted neck and shoulder, slight R cervical side bend, R anteriorly tipped scapula, decreased lumbar lordisis, decreased bilateral foot arch height   AROM   Lumbar Flexion full with bilateral glute pulling sensation (same area as pain)   Lumbar Extension WFL, no pain, or pulling sensation   Lumbar - Right Side Bend WFL, no symptoms   Lumbar - Left Side Bend WFL, no symptoms   Lumbar - Right Rotation Full, no symptoms   Lumbar - Left Rotation WFL (slightly limited compared to R rotation), no symptoms.    Strength   Overall Strength Comments prone knee flexion 4/5 bilateral hamstrings   Right Hip Extension 4-/5  Glute max extension; hamstring cramp initially   Right Hip ABduction 4/5   Left Hip Extension 4/5  glute max extension   Left Hip ABduction 4+/5  With reproduction or R SI joint pain   Palpation   Palpation comment TTP R UT with muscle knot (reproduction of R scapular symptoms), TTP R greater trochanter. TTP R iliac crest. TTP R sacroiliac joint   Ambulation/Gait   Gait Comments bilateral pelvic drop L > R           There-ex:  Directed patient with sitting with lumbar towel roll x 2 min  Prone on elbows 3 min,  Prone R hip flexion isometrics with R LE propped onto 2 pillows 10x2 with 5 second holds,  Supine hamstring stretch 10 seconds x 1 for R LE   Improved exercise technique, movement at target joints, use of target muscles after mod verbal, visual, tactile cues.   Reviewed and provided aforementioned exercises as part of her HEP. Pt demonstrated and verbalized understanding.                 PT Education - 11/01/14 1849    Education provided Yes   Education Details ther-ex, HEP, plan of care   Person(s)  Educated Patient   Methods Explanation;Demonstration;Tactile cues;Verbal cues   Comprehension Verbalized understanding;Returned demonstration             PT Long Term Goals - 11/01/14 1850    PT LONG TERM GOAL #1   Title Patient will have a decrease in low back and glute pain to 1/10 or less at worst to promote ability to maintain the sitting position for  travel.    Time 4   Period Weeks   Status New   PT LONG TERM GOAL #2   Title Patient will improve bilateral hip strength by 1/2 MMT to improve function and help decrease back pain.    Time 4   Period Weeks   Status New   PT LONG TERM GOAL #3   Title Patient will improve her Modified Oswestry low back pain disability questionnaire score by at least 6 points as a demonstration of improved function   Baseline 28%   Time 4   Period Weeks   Status New               Plan - 10-Nov-2014 1728    Clinical Impression Statement Patient is a 81 year old female who came to physical therapy secondary to R low back pain since July 2016. She also presents with reproduction of symptoms with lumbar flexion, positive special test suggesting posterior nutation of R innominate; bilateral glute med and max weakness R > L, bilateral pelvic drop during gait, TTP R greater trochanter, R iliac crest, R scacroiliac joint, and difficulty maintaining positions such as sitting in a car or airplane for travel. Patient will benefit from skilled physical therapy services to address the aforementioned deficits.           G-Codes - 11/10/14 1856    Functional Assessment Tool Used Modified Oswestry Low back pain disability questionnaire, pt interview, clinical presentation   Functional Limitation Changing and maintaining body position   Changing and Maintaining Body Position Current Status [phone]) At least 20 percent but less than 40 percent impaired, limited or restricted   Changing and Maintaining Body Position Goal Status ([id number]) At least 1 percent but  less than 20 percent impaired, limited or restricted       Problem List Patient Active Problem List   Diagnosis Date Noted  . Migraine with aura and without status migrainosus, not intractable 10/25/2014  . Intracranial arachnoid cyst 10/25/2014  . Aphasia 10/25/2014  . Hypokalemia 10/12/2014  . TIA (transient ischemic attack) 10/12/2014  . Lumbago 08/20/2014  . Back pain at L4-L5 level 08/17/2014  . Migraine aura without headache 04/24/2014  . Urinary frequency 01/13/2014  . Encounter for Medicare annual wellness exam 11/01/2013  . Accidental fall 10/16/2013  . Neck pain, acute 10/16/2013  . Coccygeal pain, acute 10/16/2013  . S/P hysterectomy with oophorectomy 01/03/2013  . Grief 10/19/2012  . HTN (hypertension) 10/19/2012  . B12 deficiency 04/16/2012  . Anxiety state 04/16/2012  . Neuropathy of both feet (HCC) 09/18/2011  . Hyperlipidemia with target LDL less than 130 03/21/2011  . Insomnia 03/21/2011  . Osteoporosis     Loralyn Freshwater PT, DPT   2014/11/10, 7:22 PM  [name] [address], [place], [address] Phone: [phone]   Fax:  [phone]

## 2014-11-03 ENCOUNTER — Ambulatory Visit: Payer: Medicare PPO

## 2014-11-03 DIAGNOSIS — G8929 Other chronic pain: Secondary | ICD-10-CM | POA: Diagnosis not present

## 2014-11-03 DIAGNOSIS — M545 Low back pain: Secondary | ICD-10-CM

## 2014-11-03 DIAGNOSIS — R531 Weakness: Secondary | ICD-10-CM

## 2014-11-03 NOTE — Therapy (Signed)
[facility] PHYSICAL AND SPORTS MEDICINE [address], [place], [address] Phone: [phone]   Fax:  [phone]  Physical Therapy Treatment  Patient Details  Name: Laura [name] MRN: [id number] Date of Birth: Feb 07, 1932 Referring Provider:  Sherlene Shams, MD  Encounter Date: 11/03/2014      PT End of Session - 11/03/14 1602    Visit Number 2   Number of Visits 9   Date for PT Re-Evaluation 12/02/14   Authorization Type 2   Authorization Time Period of 10  for Medicare G-codes   Authorization - Visit Number 2   Authorization - Number of Visits 6  6 authorized visits from insurance   PT Start Time 1603   PT Stop Time 1651   PT Time Calculation (min) 48 min   Activity Tolerance Patient tolerated treatment well;No increased pain   Behavior During Therapy [facility] for tasks assessed/performed      Past Medical History  Diagnosis Date  . Osteoporosis   . Migraine with aura   . High cholesterol   . Hypertension     Pt states that her blood pressure is very well controlled    Past Surgical History  Procedure Laterality Date  . Tubal ligation      at age 80, vaginal complicated by peritonitis  . Abdominal hysterectomy      secondary to peritonitis and complications   . Tonsillectomy    . Appendectomy      There were no vitals filed for this visit.  Visit Diagnosis:  Chronic midline low back pain without sciatica  Right low back pain, with sciatica presence unspecified  Weakness      Subjective Assessment - 11/03/14 1604    Subjective Pt states 2/10 R low back/hip discomfort   Pertinent History Pt states that her back is better right now than it has been since last month. Pt however states that she feels like she needs someone to show her what to do. Pt states that her back pain is mosltly on her R side. Pain locations: R glute, R low back, R shoulder blade. Pt states that she has been having low back pain since 15  years ago. Pt states that her daughter is a physical therapist who works on her at times but is busy and lives a little far away. Pt states that her symptoms were aggravated when she went to [facility] and drove in the car for 8 hours each way to and from [place] during the middle of July 2016 and would have to stand and walk around to help alleviate her symptoms. Pt also went to [place] at the end of July 2016 and ended up sitting for 12 hours.  Pt also adds that she likes to dance, walk, perform yoga, and also has osteoporosis.    Patient Stated Goals Pt expresses desire to get better. Increase flexibility, lengthen gluteus maximus, do something so that her back does not hurt all the time.    Currently in Pain? Yes   Pain Score 2    Multiple Pain Sites Yes        Objectives:  There-ex:  Muscle energy technique to promote anterior nutation of R innominate Prone glute max/quad set 6x5 seconds each LE (R knee discomfort, decreased with rest), Prone glute max extension 10x3, Prone bilateral shoulder lower trap raise with scapular retraction 10x3 each UE,  Supine hamstring stretch 15 seconds each LE Standing bilateral scapular retraction 10x5 seconds resisting green  band,  Standing ankle DF/PF on rocker board x 2 min,  seated bilateral hip adduction pillow squeeze 10x2 with 5 second holds   Improved exercise technique, movement at target joints, use of target muscles after mod verbal, visual, tactile cues.   Increased R posterior hip discomfort with R hamstring stretch and ankle DF. Decreased R posterior hip discomfort with standing ankle DF/PF to promote neural glide and seated hip adductor pillow squeeze to decrease piriformis activation.                         PT Long Term Goals - 11/01/14 1850    PT LONG TERM GOAL #1   Title Patient will have a decrease in low back and glute pain to 1/10 or less at worst to promote ability to maintain the sitting position for  travel.    Time 4   Period Weeks   Status New   PT LONG TERM GOAL #2   Title Patient will improve bilateral hip strength by 1/2 MMT to improve function and help decrease back pain.    Time 4   Period Weeks   Status New   PT LONG TERM GOAL #3   Title Patient will improve her Modified Oswestry low back pain disability questionnaire score by at least 6 points as a demonstration of improved function   Baseline 28%   Time 4   Period Weeks   Status New               Plan - 11/03/14 1714    Clinical Impression Statement Increased R posterior hip discomfort with R hamstring stretch and ankle DF. Decreased R posterior hip discomfort with standing ankle DF/PF to promote neural glide and seated hip adductor pillow squeeze to decrease piriformis activation.    Pt will benefit from skilled therapeutic intervention in order to improve on the following deficits Pain;Decreased strength   Rehab Potential Good   Clinical Impairments Affecting Rehab Potential age   PT Frequency 2x / week   PT Duration 4 weeks   PT Treatment/Interventions Manual techniques;Therapeutic exercise;Therapeutic activities;Electrical Stimulation;Neuromuscular re-education;Ultrasound   PT Next Visit Plan strengthening, manual therapy, neural flossing   Consulted and Agree with Plan of Care Patient        Problem List Patient Active Problem List   Diagnosis Date Noted  . Migraine with aura and without status migrainosus, not intractable 10/25/2014  . Intracranial arachnoid cyst 10/25/2014  . Aphasia 10/25/2014  . Hypokalemia 10/12/2014  . TIA (transient ischemic attack) 10/12/2014  . Lumbago 08/20/2014  . Back pain at L4-L5 level 08/17/2014  . Migraine aura without headache 04/24/2014  . Urinary frequency 01/13/2014  . Encounter for Medicare annual wellness exam 11/01/2013  . Accidental fall 10/16/2013  . Neck pain, acute 10/16/2013  . Coccygeal pain, acute 10/16/2013  . S/P hysterectomy with oophorectomy  01/03/2013  . Grief 10/19/2012  . HTN (hypertension) 10/19/2012  . B12 deficiency 04/16/2012  . Anxiety state 04/16/2012  . Neuropathy of both feet (HCC) 09/18/2011  . Hyperlipidemia with target LDL less than 130 03/21/2011  . Insomnia 03/21/2011  . Osteoporosis     Loralyn Freshwater PT, DPT   11/03/2014, 5:18 PM  [facility] PHYSICAL AND SPORTS MEDICINE [address], [place], [address] Phone: [phone]   Fax:  [phone]

## 2014-11-03 NOTE — Patient Instructions (Signed)
Sitting upright on chair, squeeze pillow between knees comfortably. Hold for 5 seconds, perform 10 times, do 3 sets.

## 2014-11-04 ENCOUNTER — Ambulatory Visit (INDEPENDENT_AMBULATORY_CARE_PROVIDER_SITE_OTHER): Payer: Medicare PPO

## 2014-11-04 DIAGNOSIS — R4701 Aphasia: Secondary | ICD-10-CM | POA: Diagnosis not present

## 2014-11-08 ENCOUNTER — Ambulatory Visit: Payer: Medicare PPO

## 2014-11-08 ENCOUNTER — Other Ambulatory Visit: Payer: Self-pay | Admitting: Internal Medicine

## 2014-11-10 ENCOUNTER — Ambulatory Visit: Payer: Medicare PPO

## 2014-11-10 ENCOUNTER — Telehealth: Payer: Self-pay | Admitting: Neurology

## 2014-11-10 ENCOUNTER — Encounter: Payer: Self-pay | Admitting: Neurology

## 2014-11-10 ENCOUNTER — Encounter: Payer: Self-pay | Admitting: *Deleted

## 2014-11-10 DIAGNOSIS — R531 Weakness: Secondary | ICD-10-CM | POA: Diagnosis not present

## 2014-11-10 DIAGNOSIS — M545 Low back pain: Secondary | ICD-10-CM | POA: Diagnosis not present

## 2014-11-10 DIAGNOSIS — G8929 Other chronic pain: Secondary | ICD-10-CM

## 2014-11-10 NOTE — Therapy (Signed)
[facility] PH[facility]ICINE [address], [place], [address] Phone: [phone]   Fax:  [phone]  Physical Therapy Treatment  Patient Details  Name: Laura [name] MRN: [id number] Date of Birth: 1932/07/21 Referring Provider:  Sherlene Shams, MD  Encounter Date: 11/10/2014      PT End of Session - 11/10/14 1600    Visit Number 3   Number of Visits 9   Date for PT Re-Evaluation 12/02/14   Authorization Type 3   Authorization Time Period of 10  for Medicare G-codes   Authorization - Visit Number 2   Authorization - Number of Visits 6  6 authorized visits from insurance   PT Start Time 1600   PT Stop Time 1645   PT Time Calculation (min) 45 min   Activity Tolerance Patient tolerated treatment well;No increased pain   Behavior During Therapy [facility] for tasks assessed/performed      Past Medical History  Diagnosis Date  . Osteoporosis   . Migraine with aura   . High cholesterol   . Hypertension     Pt states that her blood pressure is very well controlled    Past Surgical History  Procedure Laterality Date  . Tubal ligation      at age 35, vaginal complicated by peritonitis  . Abdominal hysterectomy      secondary to peritonitis and complications   . Tonsillectomy    . Appendectomy      There were no vitals filed for this visit.  Visit Diagnosis:  Chronic midline low back pain without sciatica  Right low back pain, with sciatica presence unspecified  Weakness      Subjective Assessment - 11/10/14 1600    Subjective 3/10 R posterior hip discomfort currently. Feels it when she sits down. Has not been able to perform exercises consistently secondary to her friend being in the hospital. Pt states going from the baby asprin to the regular asprin per neurologist.    Pertinent History Pt states that her back is better right now than it has been since last month. Pt however states that she feels like she  needs someone to show her what to do. Pt states that her back pain is mosltly on her R side. Pain locations: R glute, R low back, R shoulder blade. Pt states that she has been having low back pain since 15 years ago. Pt states that her daughter is a physical therapist who works on her at times but is busy and lives a little far away. Pt states that her symptoms were aggravated when she went to [facility] and drove in the car for 8 hours each way to and from [place] during the middle of July 2016 and would have to stand and walk around to help alleviate her symptoms. Pt also went to [place] at the end of July 2016 and ended up sitting for 12 hours.  Pt also adds that she likes to dance, walk, perform yoga, and also has osteoporosis.    Patient Stated Goals Pt expresses desire to get better. Increase flexibility, lengthen gluteus maximus, do something so that her back does not hurt all the time.    Currently in Pain? Yes   Pain Score 3   R posterior hip        Objectives:  There-ex:  Standing ankle DF/PF on rocker board x 2 min,  Muscle energy technique to promote anterior nutation of R innominate (no R posterior hip  discomfort afterwards) Prone glute max extension 10x3, Prone bilateral shoulder lower trap raise with scapular retraction 10x2 each UE with 5 second holds,  Prone on elbows x 2 min, Supine hamstring stretch 15 seconds x5 each LE Supine bridge with red band resisting hip abduction/ER 10x3, Standing bilateral scapular retraction 10x5 seconds for 2 sets resisting green band,     Improved exercise technique, movement at target joints, use of target muscles after mod verbal, visual, tactile cues.    No R posterior hip discomfort after manual therapy to correct anterior nutation of R innominate and at end of session.                          PT Education - 11/10/14 1908    Education provided Yes   Education Details ther-ex   Starwood HotelsPerson(s) Educated Patient    Methods Explanation;Demonstration;Tactile cues;Verbal cues   Comprehension Verbalized understanding;Returned demonstration             PT Long Term Goals - 11/01/14 1850    PT LONG TERM GOAL #1   Title Patient will have a decrease in low back and glute pain to 1/10 or less at worst to promote ability to maintain the sitting position for travel.    Time 4   Period Weeks   Status New   PT LONG TERM GOAL #2   Title Patient will improve bilateral hip strength by 1/2 MMT to improve function and help decrease back pain.    Time 4   Period Weeks   Status New   PT LONG TERM GOAL #3   Title Patient will improve her Modified Oswestry low back pain disability questionnaire score by at least 6 points as a demonstration of improved function   Baseline 28%   Time 4   Period Weeks   Status New               Plan - 11/10/14 1634    Clinical Impression Statement No R posterior hip discomfort after muscle energy technique to correct anterior nutation of R innominate and at end of session. Pt also improved her Modified Oswestry Low back pain disability questionnaire score to 14% suggesing improved function since initial evaluation.    Pt will benefit from skilled therapeutic intervention in order to improve on the following deficits Pain;Decreased strength   Rehab Potential Good   Clinical Impairments Affecting Rehab Potential age   PT Frequency 2x / week   PT Duration 4 weeks   PT Treatment/Interventions Manual techniques;Therapeutic exercise;Therapeutic activities;Electrical Stimulation;Neuromuscular re-education;Ultrasound   PT Next Visit Plan strengthening, manual therapy, neural flossing   Consulted and Agree with Plan of Care Patient        Problem List Patient Active Problem List   Diagnosis Date Noted  . Migraine with aura and without status migrainosus, not intractable 10/25/2014  . Intracranial arachnoid cyst 10/25/2014  . Aphasia 10/25/2014  . Hypokalemia  10/12/2014  . TIA (transient ischemic attack) 10/12/2014  . Lumbago 08/20/2014  . Back pain at L4-L5 level 08/17/2014  . Migraine aura without headache 04/24/2014  . Urinary frequency 01/13/2014  . Encounter for Medicare annual wellness exam 11/01/2013  . Accidental fall 10/16/2013  . Neck pain, acute 10/16/2013  . Coccygeal pain, acute 10/16/2013  . S/P hysterectomy with oophorectomy 01/03/2013  . Grief 10/19/2012  . HTN (hypertension) 10/19/2012  . B12 deficiency 04/16/2012  . Anxiety state 04/16/2012  . Neuropathy of both feet (HCC) 09/18/2011  .  Hyperlipidemia with target LDL less than 130 03/21/2011  . Insomnia 03/21/2011  . Osteoporosis    Loralyn Freshwater PT, DPT  11/10/2014, 7:16 PM  [facility] PHYSICAL AND SPORTS MEDICINE [address] [place], [address] Phone: [phone]   Fax:  [phone]

## 2014-11-10 NOTE — Telephone Encounter (Signed)
Please call patient, ultrasound of carotid artery showed 50-69% stenosis of left internal carotid artery, she should continue antiplatelet agent, single agent would be fine, per documentation, she is on aspirin 81 mg daily right now, may consider change to aspirin 325 mg daily

## 2014-11-10 NOTE — Telephone Encounter (Signed)
Per vo by Dr. Terrace ArabiaYan, inform patient of results below and instruct her to increase her daily aspirin to 325mg  daily. Dr. Terrace ArabiaYan will monitor her with a repeat carotid ultrasound. She verbalized understanding and will increase the dosage.

## 2014-11-16 ENCOUNTER — Ambulatory Visit: Payer: Medicare PPO

## 2014-11-22 ENCOUNTER — Other Ambulatory Visit: Payer: Self-pay | Admitting: Internal Medicine

## 2014-11-23 ENCOUNTER — Ambulatory Visit: Payer: Medicare PPO

## 2014-11-26 ENCOUNTER — Encounter: Payer: Self-pay | Admitting: Internal Medicine

## 2014-11-26 DIAGNOSIS — Z1211 Encounter for screening for malignant neoplasm of colon: Secondary | ICD-10-CM | POA: Diagnosis not present

## 2014-12-03 DIAGNOSIS — M5136 Other intervertebral disc degeneration, lumbar region: Secondary | ICD-10-CM | POA: Diagnosis not present

## 2014-12-03 DIAGNOSIS — M51369 Other intervertebral disc degeneration, lumbar region without mention of lumbar back pain or lower extremity pain: Secondary | ICD-10-CM | POA: Insufficient documentation

## 2014-12-03 DIAGNOSIS — Z1211 Encounter for screening for malignant neoplasm of colon: Secondary | ICD-10-CM | POA: Diagnosis not present

## 2014-12-03 DIAGNOSIS — M1711 Unilateral primary osteoarthritis, right knee: Secondary | ICD-10-CM | POA: Diagnosis not present

## 2014-12-03 DIAGNOSIS — Z1212 Encounter for screening for malignant neoplasm of rectum: Secondary | ICD-10-CM | POA: Diagnosis not present

## 2014-12-03 DIAGNOSIS — M5416 Radiculopathy, lumbar region: Secondary | ICD-10-CM | POA: Diagnosis not present

## 2014-12-08 ENCOUNTER — Other Ambulatory Visit: Payer: Self-pay | Admitting: Internal Medicine

## 2014-12-28 ENCOUNTER — Encounter: Payer: Self-pay | Admitting: Neurology

## 2014-12-28 ENCOUNTER — Ambulatory Visit (INDEPENDENT_AMBULATORY_CARE_PROVIDER_SITE_OTHER): Payer: Medicare PPO | Admitting: Neurology

## 2014-12-28 VITALS — BP 140/80 | HR 75 | Ht 65.0 in | Wt 141.0 lb

## 2014-12-28 DIAGNOSIS — G43109 Migraine with aura, not intractable, without status migrainosus: Secondary | ICD-10-CM | POA: Diagnosis not present

## 2014-12-28 NOTE — Progress Notes (Signed)
Chief Complaint  Patient presents with  . Migraine    She has only had two migraine auras since her last appointment in September 2016.  She would like to review her carotid ultrasound.      PATIENT: Laura [name] DOB: Jun 07, 1932  Chief Complaint  Patient presents with  . Migraine    She has only had two migraine auras since her last appointment in September 2016.  She would like to review her carotid ultrasound.     HISTORICAL  Laura [name] is a 81 years old left-handed female, accompanied by her daughter, seen in refer by her primary care physician Dr. Duncan Dull for evaluation of frequent migraines  She had her first migraine in 1963-06[birth year]24 during pregnancy, presented with aphasia followed by severe pounding headaches, for a while she stopped to have migraine,  Since 06-23-1973, she began to have her stereotypical migraine headaches, usually started with a sparkling dots in her left visual field, then spreading become a bright yellow C-shaped circle, gradually moving out of her left temporal visual field within 30 minutes, she felt mild achiness in her head, but no severe headaches, she is used to have similar episodes 5-6 each year,  Common trigger for her migraines are bright light, stress,  Her husband passed away in 06-23-2012, she is still in grave,  since January 2016, she began to have recurrent migraine aura without headaches but with much increased frequency, 1-4 episodes each months, in October 10 2014, she experienced one episode started with sparkling visual disturbance in her left visual field, but this time moved towards her nasal field then right temporal region, lasting for 15 minutes, then she noticed word finding difficulty lasting for a few more minutes, no loss of consciousness, no facial asymmetry, no right weakness, sensory loss, no significant headaches,  She was sent to the emergency room the same day, I have reviewed with patient MRI of the brain October 10 2014, mild  generalized atrophy, small vessel disease, left frontal arachnoid cyst, may slightly increased in size  UPDATE Dec 28 2014: We have reviewed ultrasound of carotid artery showed 50-69% stenosis of left internal carotid artery, she is now taking  aspirin 325 mg daily  She also takes magnesium, riboflavin, she only has two episodes of migraine visual aura in two months, usually triggered by bright light.  She has no aphasia, no significant headaches  She still active, well-hydrated, exercise regularly REVIEW OF SYSTEMS: Full 14 system review of systems performed and notable only for as above.  ALLERGIES: Allergies  Allergen Reactions  . Erythromycin     cramping    HOME MEDICATIONS: Current Outpatient Prescriptions  Medication Sig Dispense Refill  . ALPRAZolam (XANAX) 0.5 MG tablet Take 1 tablet (0.5 mg total) by mouth at bedtime as needed for sleep. 30 tablet 3  . aspirin 325 MG tablet Take 325 mg by mouth daily.    . calcium carbonate (OS-CAL) 600 MG TABS Take 600 mg by mouth 2 (two) times daily with a meal.    . Cholecalciferol (VITAMIN D3) 1000 UNITS CAPS Take one by mouth twice a day.    . Coenzyme Q10 (CO Q 10) 100 MG CAPS Take 1 capsule by mouth daily.    . Cyanocobalamin 2500 MCG SUBL place 1 tablet under the tongue daily 90 tablet 2  . denosumab (PROLIA) 60 MG/ML SOLN injection Inject 60 mg into the skin every 6 (six) months. Administer in upper arm, thigh, or abdomen    .  Lutein 20 MG TABS Take one by mouth daily    . MAGNESIUM CITRATE PO Take 400 mg by mouth 2 (two) times daily.     . potassium chloride SA (K-DUR,KLOR-CON) 20 MEQ tablet Take 1 tablet (20 mEq total) by mouth daily. 30 tablet 0  . pravastatin (PRAVACHOL) 40 MG tablet take 1 tablet by mouth once daily 90 tablet 1  . Riboflavin 100 MG CAPS Take 1 capsule by mouth 2 (two) times daily.     Marland Kitchen. triamterene-hydrochlorothiazide (MAXZIDE-25) 37.5-25 MG tablet take 1 tablet by mouth daily 90 tablet 1  . zolpidem (AMBIEN)  10 MG tablet take 1 tablet by mouth at bedtime if needed 30 tablet 5   No current facility-administered medications for this visit.    PAST MEDICAL HISTORY: Past Medical History  Diagnosis Date  . Osteoporosis   . Migraine with aura   . High cholesterol   . Hypertension     Pt states that her blood pressure is very well controlled    PAST SURGICAL HISTORY: Past Surgical History  Procedure Laterality Date  . Tubal ligation      at age 81, vaginal complicated by peritonitis  . Abdominal hysterectomy      secondary to peritonitis and complications   . Tonsillectomy    . Appendectomy      FAMILY HISTORY: Family History  Problem Relation Age of Onset  . Heart disease Mother   . Breast cancer Mother 4170  . Breast cancer Sister 4663    SOCIAL HISTORY:  Social History   Social History  . Marital Status: Widowed    Spouse Name: N/A  . Number of Children: 2  . Years of Education: 18   Occupational History  . Retired    Social History Main Topics  . Smoking status: Former Smoker    Quit date: 03/20/1981  . Smokeless tobacco: Never Used  . Alcohol Use: 8.4 oz/week    14 Glasses of wine per week     Comment: 2 glasses of wine per day.  . Drug Use: No  . Sexual Activity: Not on file   Other Topics Concern  . Not on file   Social History Narrative   Lives at home alone.   Left-handed.   2-4 cups caffeine per day.     PHYSICAL EXAM   Filed Vitals:   12/28/14 1422  BP: 140/80  Pulse: 75  Height: 5\' 5"  (1.651 m)  Weight: 141 lb (63.957 kg)    Not recorded      Body mass index is 23.46 kg/(m^2).  PHYSICAL EXAMNIATION:  Gen: NAD, conversant, well nourised, obese, well groomed                     Cardiovascular: Regular rate rhythm, no peripheral edema, warm, nontender. Eyes: Conjunctivae clear without exudates or hemorrhage Neck: Supple, no carotid bruise. Pulmonary: Clear to auscultation bilaterally   NEUROLOGICAL EXAM:  MENTAL STATUS: Speech:     Speech is normal; fluent and spontaneous with normal comprehension.  Cognition:     Orientation to time, place and person     Normal recent and remote memory     Normal Attention span and concentration     Normal Language, naming, repeating,spontaneous speech     Fund of knowledge   CRANIAL NERVES: CN II: Visual fields are full to confrontation. Fundoscopic exam is normal with sharp discs and no vascular changes. Pupils are round equal and briskly reactive to light. CN  III, IV, VI: extraocular movement are normal. No ptosis. CN V: Facial sensation is intact to pinprick in all 3 divisions bilaterally. Corneal responses are intact.  CN VII: Face is symmetric with normal eye closure and smile. CN VIII: Hearing is normal to rubbing fingers CN IX, X: Palate elevates symmetrically. Phonation is normal. CN XI: Head turning and shoulder shrug are intact CN XII: Tongue is midline with normal movements and no atrophy.  MOTOR: There is no pronator drift of out-stretched arms. Muscle bulk and tone are normal. Muscle strength is normal.  REFLEXES: Reflexes are 2+ and symmetric at the biceps, triceps, knees, and ankles. Plantar responses are flexor.  SENSORY: Intact to light touch, pinprick, position sense, and vibration sense are intact in fingers and toes.  COORDINATION: Rapid alternating movements and fine finger movements are intact. There is no dysmetria on finger-to-nose and heel-knee-shin.    GAIT/STANCE: Posture is normal. Gait is steady with normal steps, base, arm swing, and turning. Heel and toe walking are normal. Tandem gait is normal.  Romberg is absent.   DIAGNOSTIC DATA (LABS, IMAGING, TESTING) - I reviewed patient records, labs, notes, testing and imaging myself where available.   ASSESSMENT AND PLAN  CINTHYA BORS is a 81 y.o. female   migraine with aura  Continue daily aspirin 325 mg   Ultrasound of carotid artery showed 50-69% stenosis of left internal carotid  artery   Continue riboflavin 100 mg twice a day, magnesium oxide 400 mg twice a day   Left frontal arachnoid cyst  Levert Feinstein, M.D. Ph.D.  [facility] [address], [address] [place], [place] [address] Ph: [phone] Fax: [phone]  CC: Sherlene Shams, MD

## 2015-02-15 ENCOUNTER — Encounter: Payer: Self-pay | Admitting: Internal Medicine

## 2015-03-07 ENCOUNTER — Encounter: Payer: Self-pay | Admitting: Family Medicine

## 2015-03-07 ENCOUNTER — Telehealth: Payer: Self-pay | Admitting: Internal Medicine

## 2015-03-07 ENCOUNTER — Ambulatory Visit (INDEPENDENT_AMBULATORY_CARE_PROVIDER_SITE_OTHER): Payer: Medicare Other | Admitting: Family Medicine

## 2015-03-07 VITALS — BP 114/72 | HR 73 | Temp 98.6°F | Ht 65.0 in | Wt 147.6 lb

## 2015-03-07 DIAGNOSIS — S91312A Laceration without foreign body, left foot, initial encounter: Secondary | ICD-10-CM | POA: Diagnosis not present

## 2015-03-07 NOTE — Progress Notes (Signed)
Pre visit review using our clinic review tool, if applicable. No additional management support is needed unless otherwise documented below in the visit note. 

## 2015-03-07 NOTE — Telephone Encounter (Signed)
[place] [employer] called to get a Prior approval for Prolia. Call [employer]  [facility] [phone]. Thank you!

## 2015-03-07 NOTE — Patient Instructions (Signed)
Nice to meet you. You likely had a local reaction to the cut on your foot. Please monitor this. If you develop increasing pain, or swelling, redness, warmth, drainage, fevers, or any new or change in symptoms please seek medical attention and let us know immediately.

## 2015-03-07 NOTE — Telephone Encounter (Signed)
Rose have you started this?

## 2015-03-07 NOTE — Telephone Encounter (Signed)
No, I just saw it a few mins ago, but I have electronically submitted pt's info for [employer] verification and will notify you once I have a response. Thank you.

## 2015-03-07 NOTE — Assessment & Plan Note (Signed)
Patient with small shallow laceration left posterior heel. There is no warmth or induration to indicate cellulitis. She notes the redness is significantly improved today. There is no drainage. Suspect symptoms she had yesterday were related to local reaction to the cut. Doubt cellulitis given appearance today and improvement. Advised soap and water to keep clean. Antibiotic ointment. Cover with Band-Aid while wearing socks and shoes. If there is any change or worsening she is to let us know immediately. Given return precautions.

## 2015-03-07 NOTE — Progress Notes (Signed)
Patient ID: Laura [name], female   DOB: [birth year], 82 y.o.   MRN: [id number]  Marikay Alar, MD Phone: [phone]  Laura [name] is a 82 y.o. female who presents today for same day visit.   Patient reports a small 1 cm shallow cut on her posterior left heel that she noticed on Saturday. She is unsure how this occurred and does not remember injuring herself. She notes Saturday and yesterday it was red and aggravated and mildly warm. Did not drain anything. She's not had any fevers. She feels well overall. She notes today it is much improved and less red. There is no warmth today. There is no tenderness today. She feels significantly better with regards to her cut. She does note some neuropathy. She has put antibiotic ointment on it.  PMH: Former smoker.   ROS see history of present illness  Objective  Physical Exam Filed Vitals:   03/07/15 1019  BP: 114/72  Pulse: 73  Temp: 98.6 F (37 C)    BP Readings from Last 3 Encounters:  03/07/15 114/72  12/28/14 140/80  10/27/14 118/78   Wt Readings from Last 3 Encounters:  03/07/15 147 lb 9.6 oz (66.951 kg)  12/28/14 141 lb (63.957 kg)  10/27/14 147 lb 2 oz (66.735 kg)    Physical Exam  Constitutional: She is well-developed, well-nourished, and in no distress.  Skin: She is not diaphoretic.  Small shallow 1 cm laceration on the posterior aspect of her left heel, there is minimal tenderness of the laceration, there is no surrounding tenderness, there is no warmth, there is minimal surrounding erythema, there is no induration, there is no fluctuance, bilateral feet are warm and well perfused, bilateral feet with sensation to light touch and monofilament intact, 2+ DP pulses bilaterally     Assessment/Plan: Please see individual problem list.  Laceration of foot, left Patient with small shallow laceration left posterior heel. There is no warmth or induration to indicate cellulitis. She notes the redness is significantly  improved today. There is no drainage. Suspect symptoms she had yesterday were related to local reaction to the cut. Doubt cellulitis given appearance today and improvement. Advised soap and water to keep clean. Antibiotic ointment. Cover with Band-Aid while wearing socks and shoes. If there is any change or worsening she is to let us know immediately. Given return precautions.     Marikay Alar

## 2015-03-07 NOTE — Telephone Encounter (Signed)
Pt states once the Prolia has been approved she would like to come in on 03/28/2015. Call pt @ [phone]. Thank you!

## 2015-03-16 NOTE — Telephone Encounter (Signed)
I have rec'd Ms. Mangiapane's insurance verification for Prolia. W/out an OV she will have an estimated responsibility of $50 co-pay; w/an OV she will have an estimated responsibility of a $70 co-pay. Please make pt aware this is an estimate and we will not know an exact amt until insurance(s) has/have paid. I have sent a copy of the summary of benefits to be scanned into pt's chart.   If pt cannot afford $50-$70 for her injection, please advise her to contact [employer] at [phone] and select option #1 to see if she qualifies for one of their assistance programs. If she qualifies they will instruct her how to proceed. If you have any questions, please let me know.   Once pt recs injection, please let me know actual injection date so I can update the [employer] portal. If you have any questions, please let me know. Thank you.

## 2015-03-16 NOTE — Telephone Encounter (Signed)
Spoke with the patient and scheduled appointment with Dr. Darrick Huntsman as she has other questions she wants answered with her.

## 2015-03-16 NOTE — Telephone Encounter (Signed)
Ordered Prolia in [facility].  

## 2015-03-17 NOTE — Telephone Encounter (Signed)
Injection in fridge for 2/23 at 2pm

## 2015-03-24 ENCOUNTER — Ambulatory Visit (INDEPENDENT_AMBULATORY_CARE_PROVIDER_SITE_OTHER): Payer: Medicare Other | Admitting: *Deleted

## 2015-03-24 ENCOUNTER — Ambulatory Visit: Payer: Medicare Other | Admitting: Internal Medicine

## 2015-03-24 DIAGNOSIS — M810 Age-related osteoporosis without current pathological fracture: Secondary | ICD-10-CM | POA: Diagnosis not present

## 2015-03-24 MED ORDER — DENOSUMAB 60 MG/ML ~~LOC~~ SOLN
60.0000 mg | Freq: Once | SUBCUTANEOUS | Status: AC
  Administered 2015-03-24: 60 mg via SUBCUTANEOUS

## 2015-03-31 ENCOUNTER — Ambulatory Visit: Payer: Commercial Managed Care - HMO

## 2015-04-06 ENCOUNTER — Ambulatory Visit: Payer: Self-pay | Admitting: Internal Medicine

## 2015-04-11 ENCOUNTER — Ambulatory Visit: Payer: Commercial Managed Care - HMO

## 2015-04-22 ENCOUNTER — Ambulatory Visit (INDEPENDENT_AMBULATORY_CARE_PROVIDER_SITE_OTHER): Payer: Medicare Other | Admitting: Internal Medicine

## 2015-04-22 ENCOUNTER — Encounter: Payer: Self-pay | Admitting: Internal Medicine

## 2015-04-22 VITALS — BP 118/78 | HR 70 | Temp 97.8°F | Resp 12 | Ht 63.5 in | Wt 146.0 lb

## 2015-04-22 DIAGNOSIS — R4701 Aphasia: Secondary | ICD-10-CM | POA: Diagnosis not present

## 2015-04-22 DIAGNOSIS — G4700 Insomnia, unspecified: Secondary | ICD-10-CM | POA: Diagnosis not present

## 2015-04-22 DIAGNOSIS — I10 Essential (primary) hypertension: Secondary | ICD-10-CM | POA: Diagnosis not present

## 2015-04-22 DIAGNOSIS — E785 Hyperlipidemia, unspecified: Secondary | ICD-10-CM

## 2015-04-22 DIAGNOSIS — R350 Frequency of micturition: Secondary | ICD-10-CM

## 2015-04-22 LAB — COMPREHENSIVE METABOLIC PANEL
ALBUMIN: 4.6 g/dL (ref 3.5–5.2)
ALK PHOS: 41 U/L (ref 39–117)
ALT: 13 U/L (ref 0–35)
AST: 16 U/L (ref 0–37)
BILIRUBIN TOTAL: 0.4 mg/dL (ref 0.2–1.2)
BUN: 15 mg/dL (ref 6–23)
CALCIUM: 9.9 mg/dL (ref 8.4–10.5)
CO2: 31 mEq/L (ref 19–32)
Chloride: 101 mEq/L (ref 96–112)
Creatinine, Ser: 0.74 mg/dL (ref 0.40–1.20)
GFR: 80.17 mL/min (ref >60.00)
GLUCOSE: 91 mg/dL (ref 70–99)
POTASSIUM: 4.2 meq/L (ref 3.5–5.1)
Sodium: 139 mEq/L (ref 135–145)
TOTAL PROTEIN: 7.7 g/dL (ref 6.0–8.3)

## 2015-04-22 LAB — LIPID PANEL
CHOLESTEROL: 233 mg/dL — AB (ref 0–200)
HDL: 103.1 mg/dL (ref >39.00)
LDL Cholesterol: 106 mg/dL — ABNORMAL HIGH (ref 0–99)
NONHDL: 129.75
Total CHOL/HDL Ratio: 2
Triglycerides: 121 mg/dL (ref 0.0–149.0)
VLDL: 24.2 mg/dL (ref 0.0–40.0)

## 2015-04-22 MED ORDER — ZOLPIDEM TARTRATE 10 MG PO TABS: ORAL_TABLET | ORAL | Status: DC

## 2015-04-22 NOTE — Patient Instructions (Addendum)
Try taking 1000 mg tylenol at bedtime instead of Aleve     I will see  [facility] 12[facility]15 next Thursday   MARCH [facility]TH

## 2015-04-22 NOTE — Progress Notes (Signed)
Subjective:  Patient ID: Laura [name], female    DOB: 1932-05-10  Age: 82 y.o. MRN: [id number]  CC: The primary encounter diagnosis was Hyperlipidemia LDL goal <100. Diagnoses of Essential hypertension, Aphasia, Hyperlipidemia with target LDL less than 130, Insomnia, and Increased urinary frequency were also pertinent to this visit.  HPI Laura [name] presents for follow up on chronic conditions  "I brought a laundry list"   Left carotid stenosis 50 to 69% found by Neurology during workup for aura without migraine.  Taking asa and pravastatin . Wondering if the stenosis is related to the aura/  Auras involving the left eye only  improved in frequency to 1-2/month , then had 7 episodes  in February (just like last year) .  They incapacitate her for 3 hours and she is hindered from driving .  Has allergic rhinitis managed with claritin.   Symptoms occur  Only on left side.   Takes 3 advil per night to manage hip and knee pain at night   urinary frequency .  Can't make it through a movie. No incontinence   Toenail fungus treating with vicks   chronic insomnia: she remains  dependent on ambien 10 mg daily  And is paying out of pocket for it.   Vit d deficiency: currently taking  2000 uits daily     Outpatient Prescriptions Prior to Visit  Medication Sig Dispense Refill  . aspirin 325 MG tablet Take 325 mg by mouth daily.    . calcium carbonate (OS-CAL) 600 MG TABS Take 600 mg by mouth 2 (two) times daily with a meal.    . Cholecalciferol (VITAMIN D3) 1000 UNITS CAPS Take one by mouth twice a day.    . Coenzyme Q10 (CO Q 10) 100 MG CAPS Take 1 capsule by mouth daily.    . Cyanocobalamin 2500 MCG SUBL place 1 tablet under the tongue daily 90 tablet 2  . denosumab (PROLIA) 60 MG/ML SOLN injection Inject 60 mg into the skin every 6 (six) months. Administer in upper arm, thigh, or abdomen    . Lutein 20 MG TABS Take one by mouth daily    . pravastatin (PRAVACHOL) 40 MG tablet take 1  tablet by mouth once daily 90 tablet 1  . Riboflavin 100 MG CAPS Take 1 capsule by mouth 2 (two) times daily.     Marland Kitchen triamterene-hydrochlorothiazide (MAXZIDE-25) 37.5-25 MG tablet take 1 tablet by mouth daily 90 tablet 1  . zolpidem (AMBIEN) 10 MG tablet take 1 tablet by mouth at bedtime if needed 30 tablet 5  . ALPRAZolam (XANAX) 0.5 MG tablet Take 1 tablet (0.5 mg total) by mouth at bedtime as needed for sleep. (Patient not taking: Reported on 04/22/2015) 30 tablet 3  . potassium chloride SA (K-DUR,KLOR-CON) 20 MEQ tablet Take 1 tablet (20 mEq total) by mouth daily. (Patient not taking: Reported on 04/22/2015) 30 tablet 0  . MAGNESIUM CITRATE PO Take 400 mg by mouth 2 (two) times daily.      No facility-administered medications prior to visit.    Review of Systems;  Patient denies headache, fevers, malaise, unintentional weight loss, skin rash, eye pain, sinus congestion and sinus pain, sore throat, dysphagia,  hemoptysis , cough, dyspnea, wheezing, chest pain, palpitations, orthopnea, edema, abdominal pain, nausea, melena, diarrhea, constipation, flank pain, dysuria, hematuria, urinary  Frequency, nocturia, numbness, tingling, seizures,  Focal weakness, Loss of consciousness,  Tremor, insomnia, depression, anxiety, and suicidal ideation.      Objective:  BP  118/78 mmHg  Pulse 70  Temp(Src) 97.8 F (36.6 C) (Oral)  Resp 12  Ht 5' 3.5" (1.613 m)  Wt 146 lb (66.225 kg)  BMI 25.45 kg/m2  SpO2 97%  BP Readings from Last 3 Encounters:  04/22/15 118/78  03/07/15 114/72  12/28/14 140/80    Wt Readings from Last 3 Encounters:  04/22/15 146 lb (66.225 kg)  03/07/15 147 lb 9.6 oz (66.951 kg)  12/28/14 141 lb (63.957 kg)    General appearance: alert, cooperative and appears stated age Ears: normal TM's and external ear canals both ears Throat: lips, mucosa, and tongue normal; teeth and gums normal Neck: no adenopathy, no carotid bruit, supple, symmetrical, trachea midline and thyroid  not enlarged, symmetric, no tenderness/mass/nodules Back: symmetric, no curvature. ROM normal. No CVA tenderness. Lungs: clear to auscultation bilaterally Heart: regular rate and rhythm, S1, S2 normal, no murmur, click, rub or gallop Abdomen: soft, non-tender; bowel sounds normal; no masses,  no organomegaly Pulses: 2+ and symmetric Skin: Skin color, texture, turgor normal. No rashes or lesions Lymph nodes: Cervical, supraclavicular, and axillary nodes normal.  Lab Results  Component Value Date   HGBA1C 5.4 05/05/2014    Lab Results  Component Value Date   CREATININE 0.74 04/22/2015   CREATININE 0.67 10/27/2014   CREATININE 0.71 10/10/2014    Lab Results  Component Value Date   WBC 7.3 10/27/2014   HGB 14.3 10/27/2014   HCT 42.5 10/27/2014   PLT 302.0 10/27/2014   GLUCOSE 91 04/22/2015   CHOL 233* 04/22/2015   TRIG 121.0 04/22/2015   HDL 103.10 04/22/2015   LDLDIRECT 109.7 09/27/2011   LDLCALC 106* 04/22/2015   ALT 13 04/22/2015   AST 16 04/22/2015   NA 139 04/22/2015   K 4.2 04/22/2015   CL 101 04/22/2015   CREATININE 0.74 04/22/2015   BUN 15 04/22/2015   CO2 31 04/22/2015   TSH 2.16 10/27/2014   HGBA1C 5.4 05/05/2014    Ct Head Wo Contrast  10/10/2014  CLINICAL DATA:  Migraine are OS.  Difficulty with speech EXAM: CT HEAD WITHOUT CONTRAST TECHNIQUE: Contiguous axial images were obtained from the base of the skull through the vertex without intravenous contrast. COMPARISON:  12/22/2008. FINDINGS: Prominence of the sulci and ventricles noted consistent with brain atrophy. The cerebral and cerebellar hemispheres are otherwise normal in attenuation and morphology. There is no evidence for acute intracranial hemorrhage, cortical infarct or mass. The paranasal sinuses and mastoid air cells are clear. The calvarium appears intact. IMPRESSION: 1. No acute intracranial abnormalities. 2. Brain atrophy. Electronically Signed   By: Signa Kell M.D.   On: 10/10/2014 18:34   Mr  Brain Wo Contrast  10/10/2014  CLINICAL DATA:  Slurred speech lasting 10-15 minutes today at lunch. History of migraine with aura EXAM: MRI HEAD WITHOUT CONTRAST TECHNIQUE: Multiplanar, multiecho pulse sequences of the brain and surrounding structures were obtained without intravenous contrast. COMPARISON:  12/22/2008 FINDINGS: Calvarium and upper cervical spine: No focal marrow signal abnormality. Orbits: No significant findings. Sinuses and Mastoids: Clear. Mastoid and middle ears are clear. Brain: No acute abnormality such as acute infarct, hemorrhage, hydrocephalus, or intra-axial mass lesion. No evidence of large vessel occlusion. There is a CSF intensity structure along the high and posterior left frontal lobe which has slowly enlarged since 2007, with mass effect best seen on coronal imaging. In 2007 maximal diameter was 26 mm, currently 37 mm. No internal vessels seen on coronal T2 weighted imaging. There is no enhancement in this area  on previous brain MRI and no calcification on prior head CT. Appearance most consistent with arachnoid cyst. Enlargement could be accentuated by progressive cerebral volume loss, which is generalized and normal for age. Mild small vessel ischemic change for age throughout the bilateral cerebral white matter with remote small vessel infarct in the inferior left cerebellum. IMPRESSION: 1. No acute finding, including infarct. 2. Age congruent senescent change. 3. ~4 cm arachnoid cyst in the posterior left frontal region with mild growth since 2007. Electronically Signed   By: Marnee SpringJonathon  Watts M.D.   On: 10/10/2014 21:17    Assessment & Plan:   Problem List Items Addressed This Visit    Hyperlipidemia with target LDL less than 130    Given the noncritical carotid stenosis,   Discussed changing to higher potency statin if LDL is not < 100. However her HDL is > 100 an dshe is 82 yrs old,  So no changes will be made today.   Lab Results  Component Value Date   CHOL 233*  04/22/2015   HDL 103.10 04/22/2015   LDLCALC 106* 04/22/2015   LDLDIRECT 109.7 09/27/2011   TRIG 121.0 04/22/2015   CHOLHDL 2 04/22/2015         Insomnia    Managed with ambien ,not weanable per prior attempts. Marland Kitchen.        HTN (hypertension)    Well controlled on current regimen. Renal function stable, no changes today.  Lab Results  Component Value Date   CREATININE 0.74 04/22/2015   Lab Results  Component Value Date   NA 139 04/22/2015   K 4.2 04/22/2015   CL 101 04/22/2015   CO2 31 04/22/2015         Relevant Orders   Comprehensive metabolic panel (Completed)   Aphasia    Episodic , occuring as an aura.  Previously considered TIA an dplaced on Plavix, which was stopped by Neurology      Increased urinary frequency    Without dysuria or incontinence.  Reassurance provided.        Other Visit Diagnoses    Hyperlipidemia LDL goal <100    -  Primary    Relevant Orders    Lipid panel (Completed)      A total of 40 minutes of face to face time was spent with patient more than half of which was spent in counselling and coordination of care   I have discontinued Ms. Willetts's MAGNESIUM CITRATE PO. I am also having her maintain her Lutein, calcium carbonate, Vitamin D3, Riboflavin, Co Q 10, ALPRAZolam, potassium chloride SA, denosumab, triamterene-hydrochlorothiazide, aspirin, Cyanocobalamin, pravastatin, magnesium oxide, and zolpidem.  Meds ordered this encounter  Medications  . magnesium oxide (MAG-OX) 400 MG tablet    Sig: Take 400 mg by mouth 2 (two) times daily.  Marland Kitchen. DISCONTD: zolpidem (AMBIEN) 10 MG tablet    Sig: take 1 tablet by mouth at bedtime if needed    Dispense:  30 tablet    Refill:  5    Patient will pay cash  .  Please fill.  Keep on file for future refills  . zolpidem (AMBIEN) 10 MG tablet    Sig: take 1 tablet by mouth at bedtime if needed    Dispense:  30 tablet    Refill:  5    Patient will pay cash  .  Please fill.  Keep on file for future  refills    Medications Discontinued During This Encounter  Medication Reason  . MAGNESIUM CITRATE  PO Entry Error  . zolpidem (AMBIEN) 10 MG tablet Reorder  . zolpidem (AMBIEN) 10 MG tablet Reorder    Follow-up: No Follow-up on file.   Sherlene Shams, MD

## 2015-04-22 NOTE — Progress Notes (Signed)
Pre-visit discussion using our clinic review tool. No additional management support is needed unless otherwise documented below in the visit note.  

## 2015-04-24 DIAGNOSIS — R350 Frequency of micturition: Secondary | ICD-10-CM | POA: Insufficient documentation

## 2015-04-24 NOTE — Assessment & Plan Note (Signed)
Well controlled on current regimen. Renal function stable, no changes today.  Lab Results  Component Value Date   CREATININE 0.74 04/22/2015   Lab Results  Component Value Date   NA 139 04/22/2015   K 4.2 04/22/2015   CL 101 04/22/2015   CO2 31 04/22/2015

## 2015-04-24 NOTE — Assessment & Plan Note (Signed)
Given the noncritical carotid stenosis,   Discussed changing to higher potency statin if LDL is not < 100. However her HDL is > 100 an dshe is 82 yrs old,  So no changes will be made today.   Lab Results  Component Value Date   CHOL 233* 04/22/2015   HDL 103.10 04/22/2015   LDLCALC 106* 04/22/2015   LDLDIRECT 109.7 09/27/2011   TRIG 121.0 04/22/2015   CHOLHDL 2 04/22/2015

## 2015-04-24 NOTE — Assessment & Plan Note (Signed)
Managed with ambien ,not weanable per prior attempts. .Marland Kitchen

## 2015-04-24 NOTE — Assessment & Plan Note (Signed)
Without dysuria or incontinence.  Reassurance provided.

## 2015-04-24 NOTE — Assessment & Plan Note (Signed)
Episodic , occuring as an aura.  Previously considered TIA an dplaced on Plavix, which was stopped by Neurology

## 2015-04-25 ENCOUNTER — Encounter: Payer: Self-pay | Admitting: Internal Medicine

## 2015-06-20 ENCOUNTER — Other Ambulatory Visit: Payer: Self-pay

## 2015-06-20 MED ORDER — PRAVASTATIN SODIUM 40 MG PO TABS: 40.0000 mg | ORAL_TABLET | Freq: Every day | ORAL | Status: DC

## 2015-06-22 ENCOUNTER — Other Ambulatory Visit: Payer: Self-pay | Admitting: Internal Medicine

## 2015-06-22 DIAGNOSIS — Z1231 Encounter for screening mammogram for malignant neoplasm of breast: Secondary | ICD-10-CM

## 2015-07-11 ENCOUNTER — Ambulatory Visit
Admission: RE | Admit: 2015-07-11 | Discharge: 2015-07-11 | Disposition: A | Discharge status: Home / Self Care | Payer: Medicare Other | Source: Ambulatory Visit | Attending: Internal Medicine | Admitting: Internal Medicine

## 2015-07-11 ENCOUNTER — Other Ambulatory Visit: Payer: Self-pay | Admitting: Internal Medicine

## 2015-07-11 DIAGNOSIS — Z1231 Encounter for screening mammogram for malignant neoplasm of breast: Secondary | ICD-10-CM | POA: Diagnosis present

## 2015-08-26 ENCOUNTER — Other Ambulatory Visit: Payer: Self-pay | Admitting: Internal Medicine

## 2015-08-26 DIAGNOSIS — R079 Chest pain, unspecified: Secondary | ICD-10-CM

## 2015-08-26 MED ORDER — NITROGLYCERIN 0.4 MG SL SUBL: 0.4000 mg | SUBLINGUAL_TABLET | SUBLINGUAL | 3 refills | Status: AC | PRN

## 2015-09-16 ENCOUNTER — Telehealth: Payer: Self-pay | Admitting: Internal Medicine

## 2015-09-16 DIAGNOSIS — R918 Other nonspecific abnormal finding of lung field: Secondary | ICD-10-CM

## 2015-09-16 NOTE — Telephone Encounter (Signed)
Pt called stating it's time for her Prolia injection. Is it ok to sch? Let me know if it's ok to sch once checked with ins.  Call pt @ [phone]. Thank you!

## 2015-09-20 NOTE — Progress Notes (Signed)
Cardiology Office Note   Date:  09/21/2015   ID:  Laura [name], DOB 1932-08-04, MRN [id number]  Referring Doctor:  Sherlene Shams, MD   Cardiologist:   Almond Lint, MD   Reason for consultation:  Chief Complaint  Patient presents with  . Other    Ref by Dr. Darrick Huntsman for chest pain. Meds reviewed by the patient verbally. Pt. feels the chest pain she had was indigestion.       History of Present Illness: Laura [name] is a 82 y.o. female who presents for Evaluation of chest pain. Last episode was probably 4 weeks ago. She describes 3 separate episodes of tightness in the chest. This happened early in the morning waking her up from sleep. She describes this as moderate in intensity, mainly in the center of the chest, nonradiating, lasting 20 minutes at the most. this spontaneously resolved. Her first thought was admitted may have been been indigestion. It was her daughter that actually encourage her to see her doctor to have this evaluated.  Patient reports mild shortness of breath with walking uphill. Otherwise, she does not report any chest pains with physical activity. She regularly exercises doing Zumba classes.  She has been evaluated by neurology for episodes of changes in her vision that she describes as aura related to migraines but without the headache. She is also had issues with aphasia together with these visual symptoms of flashing lights and decrease in visual acuity. She continues to follow-up with neurology for this.  She denies fever, cough, colds, PND, orthopnea, edema.  ROS:  Please see the history of present illness. Aside from mentioned under HPI, all other systems are reviewed and negative.     Past Medical History:  Diagnosis Date  . Carotid stenosis    Left   . High cholesterol   . Hypertension    Pt states that her blood pressure is very well controlled  . Migraine with aura   . Osteoporosis     Past Surgical History:  Procedure Laterality  Date  . ABDOMINAL HYSTERECTOMY     secondary to peritonitis and complications   . APPENDECTOMY    . TONSILLECTOMY    . TUBAL LIGATION     at age 41, vaginal complicated by peritonitis     reports that she quit smoking about 34 years ago. She has never used smokeless tobacco. She reports that she drinks about 8.4 oz of alcohol per week . She reports that she does not use drugs.   family history includes Breast cancer (age of onset: 22) in her sister; Breast cancer (age of onset: 62) in her mother; Heart disease in her mother.   Outpatient Medications Prior to Visit  Medication Sig Dispense Refill  . ALPRAZolam (XANAX) 0.5 MG tablet Take 1 tablet (0.5 mg total) by mouth at bedtime as needed for sleep. 30 tablet 3  . aspirin 325 MG tablet Take 325 mg by mouth daily.    . calcium carbonate (OS-CAL) 600 MG TABS Take 600 mg by mouth 2 (two) times daily with a meal.    . Cholecalciferol (VITAMIN D3) 1000 UNITS CAPS Take one by mouth twice a day.    . Coenzyme Q10 (CO Q 10) 100 MG CAPS Take 1 capsule by mouth daily.    . Cyanocobalamin 2500 MCG SUBL place 1 tablet under the tongue daily 90 tablet 2  . denosumab (PROLIA) 60 MG/ML SOLN injection Inject 60 mg into the skin every 6 (six)  months. Administer in upper arm, thigh, or abdomen    . Lutein 20 MG TABS Take one by mouth daily    . magnesium oxide (MAG-OX) 400 MG tablet Take 400 mg by mouth 2 (two) times daily.    . nitroGLYCERIN (NITROSTAT) 0.4 MG SL tablet Place 1 tablet (0.4 mg total) under the tongue every 5 (five) minutes as needed for chest pain. 50 tablet 3  . potassium chloride SA (K-DUR,KLOR-CON) 20 MEQ tablet Take 1 tablet (20 mEq total) by mouth daily. 30 tablet 0  . pravastatin (PRAVACHOL) 40 MG tablet Take 1 tablet (40 mg total) by mouth daily. 90 tablet 1  . Riboflavin 100 MG CAPS Take 1 capsule by mouth 2 (two) times daily.     Marland Kitchen. triamterene-hydrochlorothiazide (MAXZIDE-25) 37.5-25 MG tablet take 1 tablet by mouth daily 90  tablet 1  . zolpidem (AMBIEN) 10 MG tablet take 1 tablet by mouth at bedtime if needed 30 tablet 5   No facility-administered medications prior to visit.      Allergies: Erythromycin    PHYSICAL EXAM: VS:  BP (!) 142/80 (BP Location: Right Leg, Patient Position: Sitting, Cuff Size: Normal)   Pulse 73   Ht 5\' 4"  (1.626 m)   Wt 151 lb (68.5 kg)   BMI 25.92 kg/m  , Body mass index is 25.92 kg/m. Wt Readings from Last 3 Encounters:  09/21/15 151 lb (68.5 kg)  04/22/15 146 lb (66.2 kg)  03/07/15 147 lb 9.6 oz (67 kg)    GENERAL:  well developed, well nourished, obese, not in acute distress HEENT: normocephalic, pink conjunctivae, anicteric sclerae, no xanthelasma, normal dentition, oropharynx clear NECK:  no neck vein engorgement, JVP normal, no hepatojugular reflux, carotid upstroke brisk and symmetric, no bruit, no thyromegaly, no lymphadenopathy LUNGS:  good respiratory effort, clear to auscultation bilaterally CV:  PMI not displaced, no thrills, no lifts, S1 and S2 within normal limits, no palpable S3 or S4, no murmurs, no rubs, no gallops ABD:  Soft, nontender, nondistended, normoactive bowel sounds, no abdominal aortic bruit, no hepatomegaly, no splenomegaly MS: nontender back, no kyphosis, no scoliosis, no joint deformities EXT:  2+ DP/PT pulses, no edema, no varicosities, no cyanosis, no clubbing SKIN: warm, nondiaphoretic, normal turgor, no ulcers NEUROPSYCH: alert, oriented to person, place, and time, sensory/motor grossly intact, normal mood, appropriate affect  Recent Labs: 10/27/2014: Hemoglobin 14.3; Magnesium 2.1; Platelets 302.0; TSH 2.16 04/22/2015: ALT 13; BUN 15; Creatinine, Ser 0.74; Potassium 4.2; Sodium 139   Lipid Panel    Component Value Date/Time   CHOL 233 (H) 04/22/2015 1112   TRIG 121.0 04/22/2015 1112   HDL 103.10 04/22/2015 1112   CHOLHDL 2 04/22/2015 1112   VLDL 24.2 04/22/2015 1112   LDLCALC 106 (H) 04/22/2015 1112   LDLDIRECT 109.7 09/27/2011  0857     Other studies Reviewed:  EKG:  The ekg from 09/21/2015 was personally reviewed by me and it revealed sinus rhythm, 73 BPM, left axis deviation, specific ST changes.  Additional studies/ records that were reviewed personally reviewed by me today include: None available   ASSESSMENT AND PLAN: Chest pain Shortness of breath Risk factors for CAD include age, carotid artery disease, hypertension, hyperlipidemia. Recommend further evaluation with exercise nuclear stress testing and echocardiogram. Patient already was prescribed aspirin and nitroglycerin as needed for chest pain. She has not needed to take any nitroglycerin. Patient advised to refrain from strenuous physical activity until workup is completed.  Hypertension BP is well controlled. Continue monitoring BP. Continue  current medical therapy and lifestyle changes.  Hyperlipidemia Patient on statin therapy. PCP following labs.    Current medicines are reviewed at length with the patient today.  The patient does not have concerns regarding medicines.  Labs/ tests ordered today include:  Orders Placed This Encounter  Procedures  . EKG 12-Lead    I had a lengthy and detailed discussion with the patient regarding diagnoses, prognosis, diagnostic options, treatment options , and side effects of medications.   I counseled the patient on importance of lifestyle modification including heart healthy diet, regular physical activity Once cardiac workup is done  Disposition:   FU with undersigned after tests    Signed, Almond LintAileen Hulan Szumski, MD  09/21/2015 9:51 AM    [facility]  This note was generated in part with voice recognition software and I apologize for any typographical errors that were not detected and corrected.

## 2015-09-21 ENCOUNTER — Encounter: Payer: Self-pay | Admitting: Cardiology

## 2015-09-21 ENCOUNTER — Ambulatory Visit (INDEPENDENT_AMBULATORY_CARE_PROVIDER_SITE_OTHER): Payer: Medicare Other | Admitting: Cardiology

## 2015-09-21 VITALS — BP 142/80 | HR 73 | Ht 64.0 in | Wt 151.0 lb

## 2015-09-21 DIAGNOSIS — R079 Chest pain, unspecified: Secondary | ICD-10-CM

## 2015-09-21 DIAGNOSIS — I10 Essential (primary) hypertension: Secondary | ICD-10-CM | POA: Diagnosis not present

## 2015-09-21 DIAGNOSIS — E785 Hyperlipidemia, unspecified: Secondary | ICD-10-CM | POA: Diagnosis not present

## 2015-09-21 DIAGNOSIS — R0602 Shortness of breath: Secondary | ICD-10-CM | POA: Diagnosis not present

## 2015-09-21 NOTE — Patient Instructions (Addendum)
Testing/Procedures: Your physician has requested that you have an echocardiogram. Echocardiography is a painless test that uses sound waves to create images of your heart. It provides your doctor with information about the size and shape of your heart and how well your heart's chambers and valves are working. This procedure takes approximately one hour. There are no restrictions for this procedure.  [facility] MYOVIEW  Your caregiver has ordered a Stress Test with nuclear imaging. The purpose of this test is to evaluate the blood supply to your heart muscle. This procedure is referred to as a "Non-Invasive Stress Test." This is because other than having an IV started in your vein, nothing is inserted or "invades" your body. Cardiac stress tests are done to find areas of poor blood flow to the heart by determining the extent of coronary artery disease (CAD).    Please note: these test may take anywhere between 2-4 hours to complete  [facility]  THE VOLUNTEERS AT THE FIRST DESK WILL DIRECT YOU WHERE TO GO  Date of Procedure:__Wednesday September 28, 2015 at 07:30AM__  Arrival Time for Procedure:_Arrive at 07:15AM to register__    PLEASE NOTIFY THE OFFICE AT LEAST 24 HOURS IN ADVANCE IF YOU ARE UNABLE TO KEEP YOUR APPOINTMENT.  [phone] AND  PLEASE NOTIFY NUCLEAR MEDICINE AT [facility] AT LEAST 24 HOURS IN ADVANCE IF YOU ARE UNABLE TO KEEP YOUR APPOINTMENT. [phone]  How to prepare for your Myoview test:  1. Do not eat or drink after midnight 2. No caffeine for 24 hours prior to test 3. No smoking 24 hours prior to test. 4. Your medication may be taken with water.  If your doctor stopped a medication because of this test, do not take that medication. 5. Ladies, please do not wear dresses.  Skirts or pants are appropriate. Please wear a short sleeve shirt. 6. No perfume, cologne or lotion. 7. Wear comfortable walking shoes. No heels!       Follow-Up: Your  physician recommends that you schedule a follow-up appointment after testing with Dr. Alvino ChapelIngal.  It was a pleasure seeing you today here in the office. Please do not hesitate to give us a call back if you have any further questions. [phone]  Chewey CellarPamela A. RN, BSN    Echocardiogram An echocardiogram, or echocardiography, uses sound waves (ultrasound) to produce an image of your heart. The echocardiogram is simple, painless, obtained within a short period of time, and offers valuable information to your health care provider. The images from an echocardiogram can provide information such as:  Evidence of coronary artery disease (CAD).  Heart size.  Heart muscle function.  Heart valve function.  Aneurysm detection.  Evidence of a past heart attack.  Fluid buildup around the heart.  Heart muscle thickening.  Assess heart valve function. LET [facility] PROVIDER KNOW ABOUT:  Any allergies you have.  All medicines you are taking, including vitamins, herbs, eye drops, creams, and over-the-counter medicines.  Previous problems you or members of your family have had with the use of anesthetics.  Any blood disorders you have.  Previous surgeries you have had.  Medical conditions you have.  Possibility of pregnancy, if this applies. BEFORE THE PROCEDURE  No special preparation is needed. Eat and drink normally.  PROCEDURE   In order to produce an image of your heart, gel will be applied to your chest and a wand-like tool (transducer) will be moved over your chest. The gel will help transmit the  sound waves from the transducer. The sound waves will harmlessly bounce off your heart to allow the heart images to be captured in real-time motion. These images will then be recorded.  You may need an IV to receive a medicine that improves the quality of the pictures. AFTER THE PROCEDURE You may return to your normal schedule including diet, activities, and medicines, unless your  health care provider tells you otherwise.   This information is not intended to replace advice given to you by your health care provider. Make sure you discuss any questions you have with your health care provider.   Document Released: 01/13/2000 Document Revised: 02/05/2014 Document Reviewed: 09/22/2012 Elsevier Interactive Patient Education 2016 Elsevier Inc.     Cardiac Nuclear Scanning A cardiac nuclear scan is used to check your heart for problems, such as the following:  A portion of the heart is not getting enough blood.  Part of the heart muscle has died, which happens with a heart attack.  The heart wall is not working normally.  In this test, a radioactive dye (tracer) is injected into your bloodstream. After the tracer has traveled to your heart, a scanning device is used to measure how much of the tracer is absorbed by or distributed to various areas of your heart. LET [facility] PROVIDER KNOW ABOUT:  Any allergies you have.  All medicines you are taking, including vitamins, herbs, eye drops, creams, and over-the-counter medicines.  Previous problems you or members of your family have had with the use of anesthetics.  Any blood disorders you have.  Previous surgeries you have had.  Medical conditions you have.  RISKS AND COMPLICATIONS Generally, this is a safe procedure. However, as with any procedure, problems can occur. Possible problems include:   Serious chest pain.  Rapid heartbeat.  Sensation of warmth in your chest. This usually passes quickly. BEFORE THE PROCEDURE Ask your health care provider about changing or stopping your regular medicines. PROCEDURE This procedure is usually done at a hospital and takes 2-4 hours.  An IV tube is inserted into one of your veins.  Your health care provider will inject a small amount of radioactive tracer through the tube.  You will then wait for 20-40 minutes while the tracer travels through your  bloodstream.  You will lie down on an exam table so images of your heart can be taken. Images will be taken for about 15-20 minutes.  You will exercise on a treadmill or stationary bike. While you exercise, your heart activity will be monitored with an electrocardiogram (ECG), and your blood pressure will be checked.  If you are unable to exercise, you may be given a medicine to make your heart beat faster.  When blood flow to your heart has peaked, tracer will again be injected through the IV tube.  After 20-40 minutes, you will get back on the exam table and have more images taken of your heart.  When the procedure is over, your IV tube will be removed. AFTER THE PROCEDURE  You will likely be able to leave shortly after the test. Unless your health care provider tells you otherwise, you may return to your normal schedule, including diet, activities, and medicines.  Make sure you find out how and when you will get your test results.   This information is not intended to replace advice given to you by your health care provider. Make sure you discuss any questions you have with your health care provider.   Document  Released: 02/10/2004 Document Revised: 01/20/2013 Document Reviewed: 12/24/2012 Elsevier Interactive Patient Education [employer].

## 2015-09-27 ENCOUNTER — Telehealth: Payer: Self-pay | Admitting: Cardiology

## 2015-09-27 DIAGNOSIS — R0602 Shortness of breath: Secondary | ICD-10-CM

## 2015-09-27 NOTE — Telephone Encounter (Signed)
Pt states she feels like her problem is a shortness of breath, and thinks she needs a chest xray. States she used to be a smoker and would feel at ease if she gets a chest xray also with her other testing. Please call and advise if this can be done. She will be unavailable from 1-3.

## 2015-09-27 NOTE — Telephone Encounter (Signed)
Reviewed instructions for stress test again and answered some of her questions. Instructed her to check in at same desk to register for chest xray to be done as well. She verbalized understanding and had no further questions at this time.

## 2015-09-27 NOTE — Telephone Encounter (Signed)
Left detailed voicemail message that chest xray was ordered and she [facility] be [facility] tomorrow after her stress test. Left instructions for her to call back if any questions.

## 2015-09-27 NOTE — Telephone Encounter (Signed)
Okay to order chest x-ray PA and lateral.

## 2015-09-27 NOTE — Telephone Encounter (Signed)
Patient just wants to know if she can have a chest xray. She has had several family members who have had lung cancer that was discovered by chest xray and she has not had one to her knowledge. She feels this could help in finding out what is the cause of her shortness of breath. Reviewed stress test instructions for her test tomorrow and let her know I would check with Dr. Alvino ChapelIngal for the chest xray. She reports that her PCP is going to be out on vacation for a month and wanted to see if we could order this. Let her know that I would contact her once I hear back from Dr. Alvino ChapelIngal.

## 2015-09-27 NOTE — Telephone Encounter (Signed)
Left detailed voicemail message with instructions for stress test scheduled tomorrow along with number to call back if any further questions.

## 2015-09-28 ENCOUNTER — Ambulatory Visit
Admission: RE | Admit: 2015-09-28 | Discharge: 2015-09-28 | Disposition: A | Discharge status: Home / Self Care | Payer: Medicare Other | Source: Ambulatory Visit | Attending: Cardiology | Admitting: Cardiology

## 2015-09-28 ENCOUNTER — Encounter (HOSPITAL_BASED_OUTPATIENT_CLINIC_OR_DEPARTMENT_OTHER)
Admission: RE | Admit: 2015-09-28 | Discharge: 2015-09-28 | Disposition: A | Discharge status: Home / Self Care | Payer: Medicare Other | Source: Ambulatory Visit | Attending: Cardiology | Admitting: Cardiology

## 2015-09-28 DIAGNOSIS — R079 Chest pain, unspecified: Secondary | ICD-10-CM | POA: Insufficient documentation

## 2015-09-28 DIAGNOSIS — R0602 Shortness of breath: Secondary | ICD-10-CM

## 2015-09-28 DIAGNOSIS — R911 Solitary pulmonary nodule: Secondary | ICD-10-CM | POA: Insufficient documentation

## 2015-09-28 DIAGNOSIS — I517 Cardiomegaly: Secondary | ICD-10-CM | POA: Diagnosis not present

## 2015-09-28 LAB — NM MYOCAR MULTI W/SPECT W/WALL MOTION / EF
CSEPED: 4 min
CSEPEDS: 0 s
CSEPEW: 4.6 METS
CSEPHR: 100 %
LV dias vol: 47 mL (ref 46–106)
LV sys vol: 15 mL
NUC STRESS TID: 0.62
Peak HR: 141 {beats}/min
Rest HR: 71 {beats}/min
SDS: 2
SRS: 0
SSS: 4

## 2015-09-28 MED ORDER — TECHNETIUM TC 99M TETROFOSMIN IV KIT
31.3300 | PACK | Freq: Once | INTRAVENOUS | Status: AC | PRN
  Administered 2015-09-28: 31.33 via INTRAVENOUS

## 2015-09-28 MED ORDER — TECHNETIUM TC 99M TETROFOSMIN IV KIT
13.0000 | PACK | Freq: Once | INTRAVENOUS | Status: AC | PRN
  Administered 2015-09-28: 13.84 via INTRAVENOUS

## 2015-09-29 ENCOUNTER — Other Ambulatory Visit: Payer: Self-pay | Admitting: Internal Medicine

## 2015-09-29 ENCOUNTER — Ambulatory Visit
Admission: RE | Admit: 2015-09-29 | Discharge: 2015-09-29 | Disposition: A | Discharge status: Home / Self Care | Payer: Medicare Other | Source: Ambulatory Visit | Attending: Internal Medicine | Admitting: Internal Medicine

## 2015-09-29 DIAGNOSIS — R918 Other nonspecific abnormal finding of lung field: Secondary | ICD-10-CM | POA: Diagnosis not present

## 2015-09-29 DIAGNOSIS — R911 Solitary pulmonary nodule: Secondary | ICD-10-CM

## 2015-09-29 NOTE — Progress Notes (Signed)
3 mm nodule noted on DG chest confirmed with lordotic views. Given history of tobacco abuse, CT advised.  Patient informed by  Dr Darrick Huntsmanullo

## 2015-09-30 NOTE — Telephone Encounter (Signed)
Okay to schedule this injection, thanks

## 2015-09-30 NOTE — Telephone Encounter (Signed)
Ordered in Dimension 21, thanks 

## 2015-09-30 NOTE — Telephone Encounter (Signed)
Ok Pt is scheduled.  °

## 2015-10-05 ENCOUNTER — Ambulatory Visit (INDEPENDENT_AMBULATORY_CARE_PROVIDER_SITE_OTHER): Payer: Medicare Other

## 2015-10-05 DIAGNOSIS — M810 Age-related osteoporosis without current pathological fracture: Secondary | ICD-10-CM | POA: Diagnosis not present

## 2015-10-05 DIAGNOSIS — Z23 Encounter for immunization: Secondary | ICD-10-CM

## 2015-10-05 MED ORDER — DENOSUMAB 60 MG/ML ~~LOC~~ SOLN
60.0000 mg | Freq: Once | SUBCUTANEOUS | Status: AC
  Administered 2015-10-05: 60 mg via SUBCUTANEOUS

## 2015-10-05 NOTE — Progress Notes (Signed)
Patient came in for Prolia injection.  Received in Left arm.  Patient tolerated well.   Patient also received a flu shot in Left deltoid.

## 2015-10-06 ENCOUNTER — Other Ambulatory Visit: Payer: Self-pay

## 2015-10-06 DIAGNOSIS — R911 Solitary pulmonary nodule: Secondary | ICD-10-CM

## 2015-10-10 ENCOUNTER — Other Ambulatory Visit: Payer: Self-pay

## 2015-10-10 ENCOUNTER — Ambulatory Visit (INDEPENDENT_AMBULATORY_CARE_PROVIDER_SITE_OTHER): Payer: Medicare Other

## 2015-10-10 DIAGNOSIS — R0602 Shortness of breath: Secondary | ICD-10-CM

## 2015-10-10 DIAGNOSIS — R079 Chest pain, unspecified: Secondary | ICD-10-CM

## 2015-10-11 ENCOUNTER — Ambulatory Visit
Admission: RE | Admit: 2015-10-11 | Discharge: 2015-10-11 | Disposition: A | Discharge status: Home / Self Care | Payer: Medicare Other | Source: Ambulatory Visit | Attending: Internal Medicine | Admitting: Internal Medicine

## 2015-10-11 DIAGNOSIS — R911 Solitary pulmonary nodule: Secondary | ICD-10-CM | POA: Diagnosis not present

## 2015-10-13 ENCOUNTER — Encounter: Payer: Self-pay | Admitting: Internal Medicine

## 2015-10-13 NOTE — Progress Notes (Signed)
Cardiology Office Note   Date:  10/18/2015   ID:  Laura [name], DOB October 16, 1932, MRN [id number]  Referring Doctor:  Sherlene ShamsULLO, TERESA L, MD   Cardiologist:   Almond LintAileen Audery Wassenaar, MD   Reason for consultation:  Chief Complaint  Patient presents with  . other    F/u echo and myoview. Meds reviewed verbally with pt.      History of Present Illness: Laura [name] is a 82 y.o. female who presents for Follow-up after testing  Since last visit, patient has had no recurrence of chest pain. She now feels that her symptoms were likely related to reflux. She has been having GI issues with burping with certain foods. She has tried to change her diet because of this.  Patient reports very mild shortness of breath with walking uphill. Otherwise, she does not report any chest pains with physical activity. She regularly exercises doing Zumba classes.  She denies fever, cough, colds, PND, orthopnea, edema.  ROS:  Please see the history of present illness. Aside from mentioned under HPI, all other systems are reviewed and negative.     Past Medical History:  Diagnosis Date  . Carotid stenosis    Left   . High cholesterol   . Hypertension    Pt states that her blood pressure is very well controlled  . Migraine with aura   . Osteoporosis     Past Surgical History:  Procedure Laterality Date  . ABDOMINAL HYSTERECTOMY     secondary to peritonitis and complications   . APPENDECTOMY    . TONSILLECTOMY    . TUBAL LIGATION     at age 82, vaginal complicated by peritonitis     reports that she quit smoking about 34 years ago. She has never used smokeless tobacco. She reports that she drinks about 8.4 oz of alcohol per week . She reports that she does not use drugs.   family history includes Breast cancer (age of onset: 4963) in her sister; Breast cancer (age of onset: 2170) in her mother; Heart disease in her mother.   Outpatient Medications Prior to Visit  Medication Sig Dispense Refill  .  ALPRAZolam (XANAX) 0.5 MG tablet Take 1 tablet (0.5 mg total) by mouth at bedtime as needed for sleep. 30 tablet 3  . aspirin 325 MG tablet Take 325 mg by mouth daily.    . calcium carbonate (OS-CAL) 600 MG TABS Take 600 mg by mouth 2 (two) times daily with a meal.    . Cholecalciferol (VITAMIN D3) 1000 UNITS CAPS Take one by mouth twice a day.    . Coenzyme Q10 (CO Q 10) 100 MG CAPS Take 1 capsule by mouth daily.    . Cyanocobalamin 2500 MCG SUBL place 1 tablet under the tongue daily 90 tablet 2  . denosumab (PROLIA) 60 MG/ML SOLN injection Inject 60 mg into the skin every 6 (six) months. Administer in upper arm, thigh, or abdomen    . Lutein 20 MG TABS Take one by mouth daily    . magnesium oxide (MAG-OX) 400 MG tablet Take 400 mg by mouth 2 (two) times daily.    . nitroGLYCERIN (NITROSTAT) 0.4 MG SL tablet Place 1 tablet (0.4 mg total) under the tongue every 5 (five) minutes as needed for chest pain. 50 tablet 3  . potassium chloride SA (K-DUR,KLOR-CON) 20 MEQ tablet Take 1 tablet (20 mEq total) by mouth daily. 30 tablet 0  . pravastatin (PRAVACHOL) 40 MG tablet Take 1 tablet (  40 mg total) by mouth daily. 90 tablet 1  . Riboflavin 100 MG CAPS Take 1 capsule by mouth 2 (two) times daily.     Marland Kitchen triamterene-hydrochlorothiazide (MAXZIDE-25) 37.5-25 MG tablet take 1 tablet by mouth daily 90 tablet 1  . zolpidem (AMBIEN) 10 MG tablet take 1 tablet by mouth at bedtime if needed 30 tablet 5   No facility-administered medications prior to visit.      Allergies: Erythromycin    PHYSICAL EXAM: VS:  BP 120/60 (BP Location: Left Arm, Patient Position: Sitting, Cuff Size: Normal)   Pulse 87   Ht 5\' 4"  (1.626 m)   Wt 148 lb 12 oz (67.5 kg)   BMI 25.53 kg/m  , Body mass index is 25.53 kg/m. Wt Readings from Last 3 Encounters:  10/18/15 148 lb 12 oz (67.5 kg)  09/21/15 151 lb (68.5 kg)  04/22/15 146 lb (66.2 kg)    GENERAL:  well developed, well nourished, obese, not in acute distress HEENT:  normocephalic, pink conjunctivae, anicteric sclerae, no xanthelasma, normal dentition, oropharynx clear NECK:  no neck vein engorgement, JVP normal, no hepatojugular reflux, carotid upstroke brisk and symmetric, no bruit, no thyromegaly, no lymphadenopathy LUNGS:  good respiratory effort, clear to auscultation bilaterally CV:  PMI not displaced, no thrills, no lifts, S1 and S2 within normal limits, no palpable S3 or S4, no murmurs, no rubs, no gallops ABD:  Soft, nontender, nondistended, normoactive bowel sounds, no abdominal aortic bruit, no hepatomegaly, no splenomegaly MS: nontender back, no kyphosis, no scoliosis, no joint deformities EXT:  2+ DP/PT pulses, no edema, no varicosities, no cyanosis, no clubbing SKIN: warm, nondiaphoretic, normal turgor, no ulcers NEUROPSYCH: alert, oriented to person, place, and time, sensory/motor grossly intact, normal mood, appropriate affect  Recent Labs: 10/27/2014: Hemoglobin 14.3; Magnesium 2.1; Platelets 302.0; TSH 2.16 04/22/2015: ALT 13; BUN 15; Creatinine, Ser 0.74; Potassium 4.2; Sodium 139   Lipid Panel    Component Value Date/Time   CHOL 233 (H) 04/22/2015 1112   TRIG 121.0 04/22/2015 1112   HDL 103.10 04/22/2015 1112   CHOLHDL 2 04/22/2015 1112   VLDL 24.2 04/22/2015 1112   LDLCALC 106 (H) 04/22/2015 1112   LDLDIRECT 109.7 09/27/2011 0857     Other studies Reviewed:  EKG:  The ekg from 09/21/2015 was personally reviewed by me and it revealed sinus rhythm, 73 BPM, left axis deviation, specific ST changes.  Additional studies/ records that were reviewed personally reviewed by me today include: Echo 10/10/2015: Left ventricle: The cavity size was normal. Systolic function was   normal. The estimated ejection fraction was in the range of 60%   to 65%. Wall motion was normal; there were no regional wall   motion abnormalities. Doppler parameters are consistent with   abnormal left ventricular relaxation (grade 1 diastolic    dysfunction). - Mitral valve: There was mild regurgitation. - Left atrium: The atrium was normal in size. - Right ventricle: Systolic function was normal. - Pulmonary arteries: Systolic pressure was within the normal   range.  Nuclear stress test 09/28/2015: Exercise myocardial perfusion imaging study with no significant  ischemia Normal wall motion, EF estimated at 91% No EKG changes concerning for ischemia at peak stress or in recovery. Target heart rate achieved, 100% of maximum predicted heart rate, exercised for 4 minutes, achieved 4.6 METS Low risk scan   ASSESSMENT AND PLAN: Chest pain Shortness of breath Risk factors for CAD include age, carotid artery disease, hypertension, hyperlipidemia. LVEF normal. No evidence of ischemia stress  test. Likelihood of clinically significant CAD is low. Discuss findings with patient. Patient reassured. Shortness of breath is likely from deconditioning. Patient agrees. Patient to continue to monitor for symptoms with increasing physical activity.  Hypertension BP is well controlled. Continue monitoring BP. Continue current medical therapy and lifestyle changes.  Hyperlipidemia Patient on statin therapy. PCP following labs.    Current medicines are reviewed at length with the patient today.  The patient does not have concerns regarding medicines.  Labs/ tests ordered today include:  No orders of the defined types were placed in this encounter.   I had a lengthy and detailed discussion with the patient regarding diagnoses, prognosis, diagnostic options, treatment options , and side effects of medications.   I counseled the patient on importance of lifestyle modification including heart healthy diet, regular physical activity   Disposition:   FU with undersigned prn  I spent at least 25 minutes with the patient today and more than 50% of the time was spent counseling the patient and coordinating care.        Signed, Almond Lint, MD  10/18/2015 10:42 AM    [facility]  This note was generated in part with voice recognition software and I apologize for any typographical errors that were not detected and corrected.

## 2015-10-18 ENCOUNTER — Encounter: Payer: Self-pay | Admitting: Cardiology

## 2015-10-18 ENCOUNTER — Ambulatory Visit (INDEPENDENT_AMBULATORY_CARE_PROVIDER_SITE_OTHER): Payer: Medicare Other | Admitting: Cardiology

## 2015-10-18 VITALS — BP 120/60 | HR 87 | Ht 64.0 in | Wt 148.8 lb

## 2015-10-18 DIAGNOSIS — I10 Essential (primary) hypertension: Secondary | ICD-10-CM

## 2015-10-18 DIAGNOSIS — E785 Hyperlipidemia, unspecified: Secondary | ICD-10-CM

## 2015-10-18 DIAGNOSIS — R0602 Shortness of breath: Secondary | ICD-10-CM | POA: Diagnosis not present

## 2015-10-18 NOTE — Patient Instructions (Signed)
Follow-Up: Your physician recommends that you schedule a follow-up appointment as needed.   It was a pleasure seeing you today here in the office. Please do not hesitate to give us a call back if you have any further questions. [phone]  Jamarian Jacinto A. RN, BSN    

## 2015-10-31 ENCOUNTER — Other Ambulatory Visit: Payer: Self-pay | Admitting: Internal Medicine

## 2015-11-16 ENCOUNTER — Other Ambulatory Visit: Payer: Self-pay | Admitting: Internal Medicine

## 2015-12-01 ENCOUNTER — Encounter: Payer: Self-pay | Admitting: Internal Medicine

## 2015-12-01 ENCOUNTER — Ambulatory Visit (INDEPENDENT_AMBULATORY_CARE_PROVIDER_SITE_OTHER): Payer: Medicare Other | Admitting: Internal Medicine

## 2015-12-01 VITALS — BP 138/78 | HR 76 | Temp 98.0°F | Resp 12 | Ht 64.0 in | Wt 151.2 lb

## 2015-12-01 DIAGNOSIS — M810 Age-related osteoporosis without current pathological fracture: Secondary | ICD-10-CM

## 2015-12-01 DIAGNOSIS — I6522 Occlusion and stenosis of left carotid artery: Secondary | ICD-10-CM

## 2015-12-01 DIAGNOSIS — E2839 Other primary ovarian failure: Secondary | ICD-10-CM | POA: Diagnosis not present

## 2015-12-01 DIAGNOSIS — G4700 Insomnia, unspecified: Secondary | ICD-10-CM

## 2015-12-01 DIAGNOSIS — G43109 Migraine with aura, not intractable, without status migrainosus: Secondary | ICD-10-CM

## 2015-12-01 DIAGNOSIS — G5793 Unspecified mononeuropathy of bilateral lower limbs: Secondary | ICD-10-CM

## 2015-12-01 DIAGNOSIS — E785 Hyperlipidemia, unspecified: Secondary | ICD-10-CM | POA: Diagnosis not present

## 2015-12-01 MED ORDER — ZOLPIDEM TARTRATE 10 MG PO TABS: ORAL_TABLET | ORAL | 5 refills | Status: DC

## 2015-12-01 NOTE — Progress Notes (Signed)
Pre-visit discussion using our clinic review tool. No additional management support is needed unless otherwise documented below in the visit note.  

## 2015-12-01 NOTE — Progress Notes (Signed)
Subjective:  Patient ID: Laura [name], female    DOB: 03-07-1932  Age: 82 y.o. MRN: [id number]  CC: The primary encounter diagnosis was Stenosis of left carotid artery. Diagnoses of Estrogen deficiency, Hyperlipidemia with target LDL less than 130, Migraine aura without headache, Age-related osteoporosis without current pathological fracture, Neuropathy of both feet, and Insomnia, unspecified type were also pertinent to this visit.  HPI Laura [name] presents for follow up on multiple issues.  Recent cardiology evaluation for evaluation of nonexertional chest pain was done and included a stress test and a TTE ,  Both were normal. She has had no recurrence.  During cardiology evaluation ,  Chest x ray noted a 3 mm LUL nodule.  Chest CT was done and normal.   Left proximal ICA stenosis bu ultrasound done for episode of aphasia,  Needing follow up with annual carotid doppler ,  Wants to have it done in [place]  Wants to take a break from Prolia.  Has been taking it for two years.  No side effects.   Pharmacy is advising her to stop taking [place]ambien because of her age   She had a 3 week period of abdominal distension that localized to the LLQ.  She has a history of diverticulosis and attributes the episode to mild diverticulitis, provoked by eating nuts. Did not have fevers or changes in stool habits.   Migraine with auras:  She had a Rash of auras in jan /feb,  Not recurring since adding magnesium   Outpatient Medications Prior to Visit  Medication Sig Dispense Refill  . aspirin 325 MG tablet Take 325 mg by mouth daily.    . calcium carbonate (OS-CAL) 600 MG TABS Take 600 mg by mouth 2 (two) times daily with a meal.    . Cholecalciferol (VITAMIN D3) 1000 UNITS CAPS Take one by mouth twice a day.    . Coenzyme Q10 (CO Q 10) 100 MG CAPS Take 1 capsule by mouth daily.    . Cyanocobalamin 2500 MCG SUBL PLACE 1 TABLET UNDER THE TONGUE DAILY AS DIRECTED 90 tablet 1  . denosumab (PROLIA)  60 MG/ML SOLN injection Inject 60 mg into the skin every 6 (six) months. Administer in upper arm, thigh, or abdomen    . Lutein 20 MG TABS Take one by mouth daily    . magnesium oxide (MAG-OX) 400 MG tablet Take 400 mg by mouth 2 (two) times daily.    . nitroGLYCERIN (NITROSTAT) 0.4 MG SL tablet Place 1 tablet (0.4 mg total) under the tongue every 5 (five) minutes as needed for chest pain. 50 tablet 3  . potassium chloride SA (K-DUR,KLOR-CON) 20 MEQ tablet Take 1 tablet (20 mEq total) by mouth daily. 30 tablet 0  . pravastatin (PRAVACHOL) 40 MG tablet Take 1 tablet (40 mg total) by mouth daily. 90 tablet 1  . Riboflavin 100 MG CAPS Take 1 capsule by mouth 2 (two) times daily.     Marland Kitchen. triamterene-hydrochlorothiazide (MAXZIDE-25) 37.5-25 MG tablet take 1 tablet by mouth daily 90 tablet 0  . zolpidem (AMBIEN) 10 MG tablet take 1 tablet by mouth at bedtime if needed 30 tablet 5  . ALPRAZolam (XANAX) 0.5 MG tablet Take 1 tablet (0.5 mg total) by mouth at bedtime as needed for sleep. (Patient not taking: Reported on 12/01/2015) 30 tablet 3   No facility-administered medications prior to visit.     Review of Systems;  Patient denies headache, fevers, malaise, unintentional weight loss, skin rash, eye pain,  sinus congestion and sinus pain, sore throat, dysphagia,  hemoptysis , cough, dyspnea, wheezing, chest pain, palpitations, orthopnea, edema, abdominal pain, nausea, melena, diarrhea, constipation, flank pain, dysuria, hematuria, urinary  Frequency, nocturia, numbness, tingling, seizures,  Focal weakness, Loss of consciousness,  Tremor, insomnia, depression, anxiety, and suicidal ideation.      Objective:  BP 138/78   Pulse 76   Temp 98 F (36.7 C) (Oral)   Resp 12   Ht 5\' 4"  (1.626 m)   Wt 151 lb 4 oz (68.6 kg)   SpO2 98%   BMI 25.96 kg/m   BP Readings from Last 3 Encounters:  12/01/15 138/78  10/18/15 120/60  09/21/15 (!) 142/80    Wt Readings from Last 3 Encounters:  12/01/15 151 lb  4 oz (68.6 kg)  10/18/15 148 lb 12 oz (67.5 kg)  09/21/15 151 lb (68.5 kg)    General appearance: alert, cooperative and appears stated age Ears: normal TM's and external ear canals both ears Throat: lips, mucosa, and tongue normal; teeth and gums normal Neck: no adenopathy, no carotid bruit, supple, symmetrical, trachea midline and thyroid not enlarged, symmetric, no tenderness/mass/nodules Back: symmetric, no curvature. ROM normal. No CVA tenderness. Lungs: clear to auscultation bilaterally Heart: regular rate and rhythm, S1, S2 normal, no murmur, click, rub or gallop Abdomen: soft, non-tender; bowel sounds normal; no masses,  no organomegaly Pulses: 2+ and symmetric Skin: Skin color, texture, turgor normal. No rashes or lesions Lymph nodes: Cervical, supraclavicular, and axillary nodes normal.  Lab Results  Component Value Date   HGBA1C 5.4 05/05/2014    Lab Results  Component Value Date   CREATININE 0.74 04/22/2015   CREATININE 0.67 10/27/2014   CREATININE 0.71 10/10/2014    Lab Results  Component Value Date   WBC 7.3 10/27/2014   HGB 14.3 10/27/2014   HCT 42.5 10/27/2014   PLT 302.0 10/27/2014   GLUCOSE 91 04/22/2015   CHOL 233 (H) 04/22/2015   TRIG 121.0 04/22/2015   HDL 103.10 04/22/2015   LDLDIRECT 109.7 09/27/2011   LDLCALC 106 (H) 04/22/2015   ALT 13 04/22/2015   AST 16 04/22/2015   NA 139 04/22/2015   K 4.2 04/22/2015   CL 101 04/22/2015   CREATININE 0.74 04/22/2015   BUN 15 04/22/2015   CO2 31 04/22/2015   TSH 2.16 10/27/2014   HGBA1C 5.4 05/05/2014    Ct Chest Wo Contrast  Result Date: 10/11/2015 CLINICAL DATA:  F/U CXR 8/0/2017. Pt states she was slightly SOB when exerting herself walking x 1 month. NKI. No hx CA. No hx thoracic surg. Former smoker, quit 35 years ago. EXAM: CT CHEST WITHOUT CONTRAST TECHNIQUE: Multidetector CT imaging of the chest was performed following the standard protocol without IV contrast. COMPARISON:  Chest radiograph,  09/29/2015. FINDINGS: Neck base and axilla: No mass or adenopathy. Cardiovascular: Minimal left coronary artery calcification. Heart normal in size. Mild atherosclerotic calcifications along the superior aortic arch. Great vessels normal in caliber. Mediastinum/Nodes: No mass or adenopathy. Lungs/Pleura: No lung mass or nodule. Specifically, there is no nodule to correspond to the possible small left apical nodule noted on the recent chest radiographs. There is no lung consolidation or edema. There is some minor scarring in the anterior lung bases. Mild scarring or subsegmental atelectasis is noted in the posterior lower lobes at the lung bases. The lungs are otherwise clear. No pleural effusion or pneumothorax. Upper Abdomen: No acute abnormality. Musculoskeletal: Unremarkable. IMPRESSION: 1. No evidence of a lung nodule. Small nodular density  noted on the current chest radiograph was due to superimposed normal bronchovascular and other structures. 2. No acute findings on the chest CT. Minor dependent subsegmental atelectasis or scarring. Lungs are otherwise clear. Electronically Signed   By: Amie Portlandavid  Ormond M.D.   On: 10/11/2015 11:56    Assessment & Plan:   Problem List Items Addressed This Visit    Osteoporosis    Her desire to suspend Prolia is due to concer about long term use,  Not due to side effects.  After discussion she is comfortable continuing it       Hyperlipidemia with target LDL less than 130    She has noncritical carotid stenosis (Left ICA 82 yo 69% by 2016 US) and is 82 yrs old with an  HDL is > 100.  Continue pravastatin and aspirin.  .   Lab Results  Component Value Date   CHOL 233 (H) 04/22/2015   HDL 103.10 04/22/2015   LDLCALC 106 (H) 04/22/2015   LDLDIRECT 109.7 09/27/2011   TRIG 121.0 04/22/2015   CHOLHDL 2 04/22/2015         Insomnia    I have reviewed the dangers of prescribing this drug to the elderly with patient.  She has been [place] dependent for over two  years, and prior attempts to use an alternative medication  have been unsuccessful resulting in loss of sleep..She is willing to accept the risks of adverse events.       Migraine aura without headache    No recurrence since she started a daily magnesium supplement       Neuropathy of both feet   Relevant Medications   zolpidem (AMBIEN) 10 MG tablet    Other Visit Diagnoses    Stenosis of left carotid artery    -  Primary   Relevant Orders   US Carotid Duplex Bilateral   Estrogen deficiency       Relevant Orders   DG BONE DENSITY (DXA)     A total of 25 minutes of face to face time was spent with patient more than half of which was spent in counselling about the above mentioned conditions  and coordination of care  I am having Ms. Surgeon maintain her Lutein, calcium carbonate, Vitamin D3, Riboflavin, Co Q 10, ALPRAZolam, potassium chloride SA, denosumab, aspirin, magnesium oxide, pravastatin, nitroGLYCERIN, Cyanocobalamin, triamterene-hydrochlorothiazide, and zolpidem.  Meds ordered this encounter  Medications  . zolpidem (AMBIEN) 10 MG tablet    Sig: take 1 tablet by mouth at bedtime if needed    Dispense:  30 tablet    Refill:  5    Patient will pay cash  .  Please fill.  Alternative medications have been tried without success  Patient understands  the risk of continuing this medication and willing to accept the responsibility    Medications Discontinued During This Encounter  Medication Reason  . zolpidem (AMBIEN) 10 MG tablet Reorder    Follow-up: No Follow-up on file.   Sherlene ShamsULLO, Kieren Adkison L, MD

## 2015-12-03 NOTE — Assessment & Plan Note (Signed)
Her desire to suspend Prolia is due to concer about long term use,  Not due to side effects.  After discussion she is comfortable continuing it

## 2015-12-03 NOTE — Assessment & Plan Note (Signed)
She has noncritical carotid stenosis (Left ICA 82 yo 69% by 2016 US) and is 82 yrs old with an  HDL is > 100.  Continue pravastatin and aspirin.  .   Lab Results  Component Value Date   CHOL 233 (H) 04/22/2015   HDL 103.10 04/22/2015   LDLCALC 106 (H) 04/22/2015   LDLDIRECT 109.7 09/27/2011   TRIG 121.0 04/22/2015   CHOLHDL 2 04/22/2015

## 2015-12-03 NOTE — Assessment & Plan Note (Signed)
No recurrence since she started a daily magnesium supplement

## 2015-12-03 NOTE — Assessment & Plan Note (Signed)
I have reviewed the dangers of prescribing this drug to the elderly with patient.  She has been [place] dependent for over two years, and prior attempts to use an alternative medication  have been unsuccessful resulting in loss of sleep..She is willing to accept the risks of adverse events.

## 2015-12-05 ENCOUNTER — Other Ambulatory Visit: Payer: Self-pay | Admitting: Internal Medicine

## 2015-12-05 DIAGNOSIS — I6529 Occlusion and stenosis of unspecified carotid artery: Secondary | ICD-10-CM

## 2015-12-28 ENCOUNTER — Ambulatory Visit: Payer: Medicare PPO | Admitting: Neurology

## 2015-12-31 ENCOUNTER — Other Ambulatory Visit: Payer: Self-pay | Admitting: Internal Medicine

## 2016-01-03 ENCOUNTER — Telehealth: Payer: Self-pay | Admitting: Internal Medicine

## 2016-01-03 NOTE — Telephone Encounter (Signed)
fyi

## 2016-01-03 NOTE — Telephone Encounter (Signed)
[facility]I [facility]

## 2016-01-03 NOTE — Telephone Encounter (Signed)
Cathy from the Nurse line called and stated that the pt is declining to go to the Ed. Starlyn Skeansdvised Cathy that we will call the pt to set up an appt. Thank you!

## 2016-01-03 NOTE — Telephone Encounter (Signed)
Patient Name: [name] DOB: [birth year] Initial Comment Caller states Laura FolksAmanda w/ pt has fever 101.9, , coughing and body aches; ribs are aching, she thinks d/t coughing; no difficulty breathing; Nurse Assessment Nurse: Charna Elizabethrumbull, RN, Cathy Date/Time (Eastern Time): 01/03/2016 2:49:33 PM Confirm and document reason for call. If symptomatic, describe symptoms. ---[name] states she developed a non-productive cough about 4 days ago. No severe breathing difficulty. She developed fever today (101.9 oral about 20 minutes ago) and mild rib pain yesterday that she thinks is due to her coughing. Alert and responsive. Does the patient have any new or worsening symptoms? ---Yes Will a triage be completed? ---Yes Related visit to physician within the last 2 weeks? ---No Does the PT have any chronic conditions? (i.e. diabetes, asthma, etc.) ---Yes List chronic conditions. ---Osteoporosis, Carotid Artery problems, Migraine Vision problems, High Blood Pressure and Cholesterol Is this a behavioral health or substance abuse call? ---No Guidelines Guideline Title Affirmed Question Affirmed Notes Cough - Acute Non-Productive Chest pain (Exception: MILD central chest pain, present only when coughing) Final Disposition User Go to ED Now Trumbull, RN, [employer] CorporationCathy Comments [name] states she is having rib pain even when she is not coughing. She declines the Go to ER disposition and prefers to see her MD. Jeanene Erballed the office backline and notified Laura Folksmanda who states the MD's nurse will call her back. Palmina notified. Referrals GO TO FACILITY REFUSED Disagree/Comply: Disagree Disagree/Comply Reason: Disagree with instructions

## 2016-01-03 NOTE — Telephone Encounter (Signed)
Patient scheduled with Claris CheMargaret 01/04/16.

## 2016-01-03 NOTE — Telephone Encounter (Signed)
Please call and see about scheduling. thanks

## 2016-01-03 NOTE — Telephone Encounter (Signed)
Pt called and stated that she has a fever of 101.9 with body aches and coughing. Sent pt's call to [facility] Triage.   Call pt @ [phone]

## 2016-01-03 NOTE — Progress Notes (Signed)
Subjective:    Patient ID: Laura [name], female    DOB: Mar 11, 1932, 82 y.o.   MRN: [id number]  CC: Laura [name] is a 82 y.o. female who presents today for an acute visit.    HPI: CC: cough, fever, rib pain x 5 days , improved. 'Cough not as deep.' Coughs all day. Ribs hurt all the 'way around'.   Fever tmax yesterday 101.9. Endorses clear runny nasal discharge. Tried OTC cough syrups, advil with some improvement. Denies exertional chest pain or pressure, numbness or tingling radiating to left arm or jaw, palpitations, dizziness, frequent headaches, changes in vision, or shortness of breath.   Former smoker. No inhalers.     HISTORY:  Past Medical History:  Diagnosis Date  . Carotid stenosis    Left   . High cholesterol   . Hypertension    Pt states that her blood pressure is very well controlled  . Migraine with aura   . Osteoporosis    Past Surgical History:  Procedure Laterality Date  . ABDOMINAL HYSTERECTOMY     secondary to peritonitis and complications   . APPENDECTOMY    . TONSILLECTOMY    . TUBAL LIGATION     at age 4, vaginal complicated by peritonitis   Family History  Problem Relation Age of Onset  . Heart disease Mother   . Breast cancer Mother 10  . Breast cancer Sister 102    Allergies: Erythromycin Current Outpatient Prescriptions on File Prior to Visit  Medication Sig Dispense Refill  . ALPRAZolam (XANAX) 0.5 MG tablet Take 1 tablet (0.5 mg total) by mouth at bedtime as needed for sleep. 30 tablet 3  . aspirin 325 MG tablet Take 325 mg by mouth daily.    . calcium carbonate (OS-CAL) 600 MG TABS Take 600 mg by mouth 2 (two) times daily with a meal.    . Cholecalciferol (VITAMIN D3) 1000 UNITS CAPS Take one by mouth twice a day.    . Coenzyme Q10 (CO Q 10) 100 MG CAPS Take 1 capsule by mouth daily.    . Cyanocobalamin 2500 MCG SUBL PLACE 1 TABLET UNDER THE TONGUE DAILY AS DIRECTED 90 tablet 1  . denosumab (PROLIA) 60 MG/ML SOLN injection Inject 60  mg into the skin every 6 (six) months. Administer in upper arm, thigh, or abdomen    . Lutein 20 MG TABS Take one by mouth daily    . magnesium oxide (MAG-OX) 400 MG tablet Take 400 mg by mouth 2 (two) times daily.    . nitroGLYCERIN (NITROSTAT) 0.4 MG SL tablet Place 1 tablet (0.4 mg total) under the tongue every 5 (five) minutes as needed for chest pain. 50 tablet 3  . potassium chloride SA (K-DUR,KLOR-CON) 20 MEQ tablet Take 1 tablet (20 mEq total) by mouth daily. 30 tablet 0  . pravastatin (PRAVACHOL) 40 MG tablet take 1 tablet by mouth once daily 90 tablet 1  . Riboflavin 100 MG CAPS Take 1 capsule by mouth 2 (two) times daily.     Marland Kitchen triamterene-hydrochlorothiazide (MAXZIDE-25) 37.5-25 MG tablet take 1 tablet by mouth daily 90 tablet 0  . zolpidem (AMBIEN) 10 MG tablet take 1 tablet by mouth at bedtime if needed 30 tablet 5   No current facility-administered medications on file prior to visit.     Social History  Substance Use Topics  . Smoking status: Former Smoker    Quit date: 03/20/1981  . Smokeless tobacco: Never Used  . Alcohol use 8.4  oz/week    14 Glasses of wine per week     Comment: 2 glasses of wine per day.    Review of Systems  Constitutional: Positive for fever. Negative for chills.  HENT: Positive for congestion.   Eyes: Negative for visual disturbance.  Respiratory: Positive for cough. Negative for chest tightness, shortness of breath and wheezing.   Cardiovascular: Negative for chest pain and palpitations.  Gastrointestinal: Negative for nausea and vomiting.  Neurological: Negative for headaches.      Objective:    BP 130/78   Pulse 96   Temp 98.7 F (37.1 C) (Oral)   Ht 5\' 4"  (1.626 m)   Wt 151 lb 9.6 oz (68.8 kg)   SpO2 97%   BMI 26.02 kg/m    Physical Exam  Constitutional: She appears well-developed and well-nourished.  HENT:  Head: Normocephalic and atraumatic.  Right Ear: Hearing, tympanic membrane, external ear and ear canal normal. No  drainage, swelling or tenderness. No foreign bodies. Tympanic membrane is not erythematous and not bulging. No middle ear effusion. No decreased hearing is noted.  Left Ear: Hearing, tympanic membrane, external ear and ear canal normal. No drainage, swelling or tenderness. No foreign bodies. Tympanic membrane is not erythematous and not bulging.  No middle ear effusion. No decreased hearing is noted.  Nose: Nose normal. No rhinorrhea. Right sinus exhibits no maxillary sinus tenderness and no frontal sinus tenderness. Left sinus exhibits no maxillary sinus tenderness and no frontal sinus tenderness.  Mouth/Throat: Uvula is midline, oropharynx is clear and moist and mucous membranes are normal. No oropharyngeal exudate, posterior oropharyngeal edema, posterior oropharyngeal erythema or tonsillar abscesses.  Eyes: Conjunctivae are normal.  Cardiovascular: Regular rhythm, normal heart sounds and normal pulses.   Pulmonary/Chest: Effort normal and breath sounds normal. She has no wheezes. She has no rhonchi. She has no rales.     She exhibits tenderness. She exhibits no bony tenderness, no edema, no deformity and no swelling.  Point tenderness noted on diagram.   Lymphadenopathy:       Head (right side): No submental, no submandibular, no tonsillar, no preauricular, no posterior auricular and no occipital adenopathy present.       Head (left side): No submental, no submandibular, no tonsillar, no preauricular, no posterior auricular and no occipital adenopathy present.    She has no cervical adenopathy.  Neurological: She is alert.  Skin: Skin is warm and dry.  Psychiatric: She has a normal mood and affect. Her speech is normal and behavior is normal. Thought content normal.  Vitals reviewed.      Assessment & Plan:   1. Acute bronchitis, unspecified organism Afebrile today. No adventitious lung sounds. Very reassured the patient is improving. Rib pain reproducible. No acute cardiac symptoms.  Pending chest x-ray to ensure no underlying infection. Otherwise patient and I agreed on conservative management at this point. Return precautions given.  - DG Chest 2 View - benzonatate (TESSALON) 100 MG capsule; Take 1 capsule (100 mg total) by mouth 2 (two) times daily as needed for cough.  Dispense: 20 capsule; Refill: 0 - fluticasone (FLONASE) 50 MCG/ACT nasal spray; Place 2 sprays into both nostrils daily.  Dispense: 16 g; Refill: 3    I am having Ms. Vegh start on benzonatate and fluticasone. I am also having her maintain her Lutein, calcium carbonate, Vitamin D3, Riboflavin, Co Q 10, ALPRAZolam, potassium chloride SA, denosumab, aspirin, magnesium oxide, nitroGLYCERIN, Cyanocobalamin, triamterene-hydrochlorothiazide, zolpidem, and pravastatin.   Meds ordered this encounter  Medications  . benzonatate (TESSALON) 100 MG capsule    Sig: Take 1 capsule (100 mg total) by mouth 2 (two) times daily as needed for cough.    Dispense:  20 capsule    Refill:  0    Order Specific Question:   Supervising Provider    Answer:   Duncan DullULLO, TERESA L [[id number]]  . fluticasone (FLONASE) 50 MCG/ACT nasal spray    Sig: Place 2 sprays into both nostrils daily.    Dispense:  16 g    Refill:  3    Order Specific Question:   Supervising Provider    Answer:   Sherlene ShamsULLO, TERESA L [[id number]]    Return precautions given.   Risks, benefits, and alternatives of the medications and treatment plan prescribed today were discussed, and patient expressed understanding.   Education regarding symptom management and diagnosis given to patient on AVS.  Continue to follow with TULLO, Mar DaringERESA L, MD for routine health maintenance.   Solenne J Gaspar and I agreed with plan.   Rennie PlowmanMargaret Cayman Kielbasa, FNP

## 2016-01-03 NOTE — Telephone Encounter (Signed)
[facility] scheduled patient to see NP 01/04/16 @ 8.00 AM

## 2016-01-04 ENCOUNTER — Encounter: Payer: Self-pay | Admitting: Family

## 2016-01-04 ENCOUNTER — Ambulatory Visit (INDEPENDENT_AMBULATORY_CARE_PROVIDER_SITE_OTHER): Payer: Medicare Other | Admitting: Family

## 2016-01-04 ENCOUNTER — Ambulatory Visit (INDEPENDENT_AMBULATORY_CARE_PROVIDER_SITE_OTHER): Payer: Medicare Other

## 2016-01-04 VITALS — BP 130/78 | HR 96 | Temp 98.7°F | Ht 64.0 in | Wt 151.6 lb

## 2016-01-04 DIAGNOSIS — J209 Acute bronchitis, unspecified: Secondary | ICD-10-CM

## 2016-01-04 MED ORDER — FLUTICASONE PROPIONATE 50 MCG/ACT NA SUSP: 2.0000 | Freq: Every day | NASAL | 3 refills | Status: DC

## 2016-01-04 MED ORDER — BENZONATATE 100 MG PO CAPS: 100.0000 mg | ORAL_CAPSULE | Freq: Two times a day (BID) | ORAL | 0 refills | Status: DC | PRN

## 2016-01-04 NOTE — Patient Instructions (Signed)
CXR  Trial flonase for clear nasal discharge; may continue claritin  Let us know if not better  Increase intake of clear fluids. Congestion is best treated by hydration, when mucus is wetter, it is thinner, less sticky, and easier to expel from the body, either through coughing up drainage, or by blowing your nose.   Get plenty of rest.   Use saline nasal drops and blow your nose frequently. Run a humidifier at night and elevate the head of the bed. Vicks Vapor rub will help with congestion and cough. Steam showers and sinus massage for congestion.   Use Acetaminophen or Ibuprofen as needed for fever or pain. Avoid second hand smoke. Even the smallest exposure will worsen symptoms.   You can also try a teaspoon of honey to see if this will help reduce cough. Throat lozenges can sometimes be beneficial as well.    This illness will typically last 7 - 10 days.   Please follow up with our clinic if you develop a fever greater than 101 F, symptoms worsen, or do not resolve in the next week.

## 2016-01-04 NOTE — Progress Notes (Signed)
Pre visit review using our clinic review tool, if applicable. No additional management support is needed unless otherwise documented below in the visit note. 

## 2016-01-11 ENCOUNTER — Ambulatory Visit: Payer: Medicare Other

## 2016-01-26 ENCOUNTER — Ambulatory Visit: Payer: Medicare Other

## 2016-01-26 DIAGNOSIS — I6529 Occlusion and stenosis of unspecified carotid artery: Secondary | ICD-10-CM

## 2016-01-27 ENCOUNTER — Encounter: Payer: Self-pay | Admitting: Internal Medicine

## 2016-02-22 ENCOUNTER — Ambulatory Visit
Admission: RE | Admit: 2016-02-22 | Discharge: 2016-02-22 | Disposition: A | Discharge status: Home / Self Care | Payer: Medicare Other | Source: Ambulatory Visit | Attending: Internal Medicine | Admitting: Internal Medicine

## 2016-02-22 DIAGNOSIS — M8588 Other specified disorders of bone density and structure, other site: Secondary | ICD-10-CM | POA: Diagnosis not present

## 2016-02-22 DIAGNOSIS — M810 Age-related osteoporosis without current pathological fracture: Secondary | ICD-10-CM | POA: Insufficient documentation

## 2016-02-22 DIAGNOSIS — E2839 Other primary ovarian failure: Secondary | ICD-10-CM | POA: Diagnosis present

## 2016-02-22 DIAGNOSIS — M85852 Other specified disorders of bone density and structure, left thigh: Secondary | ICD-10-CM | POA: Diagnosis not present

## 2016-02-23 ENCOUNTER — Encounter: Payer: Self-pay | Admitting: Internal Medicine

## 2016-02-27 ENCOUNTER — Telehealth: Payer: Self-pay | Admitting: Internal Medicine

## 2016-02-27 NOTE — Telephone Encounter (Signed)
Patient left a voicemail requesting a Prolia injection . I returned the patient's called to inform her that she will need a prior authorization before she can have the injection.

## 2016-02-27 NOTE — Telephone Encounter (Signed)
Patient is due in March.

## 2016-03-27 NOTE — Telephone Encounter (Signed)
Prolia authorization faxed today awaiting response.

## 2016-04-04 ENCOUNTER — Telehealth: Payer: Self-pay

## 2016-04-04 NOTE — Telephone Encounter (Signed)
PA for Zolpidem tartrate completed on cover my meds

## 2016-04-05 NOTE — Telephone Encounter (Signed)
[facility] called and needed more information on this Pa. This case will expire on 3/10. Please advise, thank you!  Call [phone]  Ref # [id number]

## 2016-04-06 NOTE — Telephone Encounter (Signed)
Called [facility] and gave clinical information for PA, it is being sent for further review to the pharmacist. pending

## 2016-04-10 NOTE — Telephone Encounter (Signed)
Pt called and is looking for a status update on when she can have her Priolia shot. Please advise, thank you!  Call pt @ [phone]

## 2016-04-10 NOTE — Telephone Encounter (Signed)
Spoke with the patient, ordered Prolia for her to receive at next OV on the 20th of march. thanks

## 2016-04-17 ENCOUNTER — Ambulatory Visit: Payer: Self-pay | Admitting: Internal Medicine

## 2016-04-19 ENCOUNTER — Ambulatory Visit: Payer: Medicare Other

## 2016-04-20 ENCOUNTER — Ambulatory Visit (INDEPENDENT_AMBULATORY_CARE_PROVIDER_SITE_OTHER): Payer: Medicare Other | Admitting: *Deleted

## 2016-04-20 DIAGNOSIS — M810 Age-related osteoporosis without current pathological fracture: Secondary | ICD-10-CM | POA: Diagnosis not present

## 2016-04-20 MED ORDER — DENOSUMAB 60 MG/ML ~~LOC~~ SOLN
60.0000 mg | Freq: Once | SUBCUTANEOUS | Status: AC
  Administered 2016-04-20: 60 mg via SUBCUTANEOUS

## 2016-04-20 NOTE — Progress Notes (Signed)
Patient presented for Prolia injection to right arm which was placed SubQ, patient voiced or showed no signs of discomfort during injection.

## 2016-04-24 NOTE — Progress Notes (Signed)
  I have reviewed the above information and agree with above.   Medea Deines, MD 

## 2016-05-16 ENCOUNTER — Telehealth: Payer: Self-pay | Admitting: Internal Medicine

## 2016-05-16 NOTE — Telephone Encounter (Signed)
Left pt message asking to call Allison back directly at [phone] to schedule AWV. Thanks! °

## 2016-05-27 ENCOUNTER — Other Ambulatory Visit: Payer: Self-pay | Admitting: Internal Medicine

## 2016-05-28 NOTE — Telephone Encounter (Signed)
Printed, signed and faxed.  

## 2016-05-28 NOTE — Telephone Encounter (Signed)
Patient wellknown to me.  Refills Berkley Harvey

## 2016-05-28 NOTE — Telephone Encounter (Signed)
Ok to refill last script 12/01/15 #30 with 5rf. Last addressed on 12/01/15 next office visit 06-11-16

## 2016-06-05 ENCOUNTER — Ambulatory Visit (INDEPENDENT_AMBULATORY_CARE_PROVIDER_SITE_OTHER): Payer: Medicare Other

## 2016-06-05 VITALS — BP 130/80 | HR 77 | Temp 98.5°F | Resp 12 | Ht 64.0 in | Wt 148.0 lb

## 2016-06-05 DIAGNOSIS — Z0000 Encounter for general adult medical examination without abnormal findings: Secondary | ICD-10-CM

## 2016-06-05 NOTE — Progress Notes (Signed)
Care was provided under my supervision. I agree with the management as indicated in the note.  Dearia Wilmouth DO  

## 2016-06-05 NOTE — Progress Notes (Signed)
Subjective:   Laura [name] is a 83 y.o. female who presents for Medicare Annual (Subsequent) preventive examination.  Review of Systems:  No ROS.  Medicare Wellness Visit. Cardiac Risk Factors include: advanced age (>84men, >62 women);hypertension     Objective:     Vitals: BP 130/80 (BP Location: Left Arm, Patient Position: Sitting, Cuff Size: Normal)   Pulse 77   Temp 98.5 F (36.9 C) (Oral)   Resp 12   Ht 5\' 4"  (1.626 m)   Wt 148 lb (67.1 kg)   SpO2 96%   BMI 25.40 kg/m   Body mass index is 25.4 kg/m.   Tobacco History  Smoking Status  . Former Smoker  . Quit date: 03/20/1981  Smokeless Tobacco  . Never Used     Counseling given: Not Answered   Past Medical History:  Diagnosis Date  . Carotid stenosis    Left   . High cholesterol   . Hypertension    Pt states that her blood pressure is very well controlled  . Migraine with aura   . Osteoporosis    Past Surgical History:  Procedure Laterality Date  . ABDOMINAL HYSTERECTOMY     secondary to peritonitis and complications   . APPENDECTOMY    . TONSILLECTOMY    . TUBAL LIGATION     at age 47, vaginal complicated by peritonitis   Family History  Problem Relation Age of Onset  . Adopted: Yes  . Heart disease Mother   . Breast cancer Mother 65  . Cancer Mother     Breast   . Breast cancer Sister 72  . Cancer Daughter     Breast    History  Sexual Activity  . Sexual activity: No    Outpatient Encounter Prescriptions as of 06/05/2016  Medication Sig  . aspirin 325 MG tablet Take 325 mg by mouth daily.  . benzonatate (TESSALON) 100 MG capsule Take 1 capsule (100 mg total) by mouth 2 (two) times daily as needed for cough.  . calcium carbonate (OS-CAL) 600 MG TABS Take 600 mg by mouth 2 (two) times daily with a meal.  . Cholecalciferol (VITAMIN D3) 1000 UNITS CAPS Take one by mouth twice a day.  . Coenzyme Q10 (CO Q 10) 100 MG CAPS Take 1 capsule by mouth daily.  . Cyanocobalamin 2500 MCG SUBL  PLACE 1 TABLET UNDER THE TONGUE DAILY AS DIRECTED  . denosumab (PROLIA) 60 MG/ML SOLN injection Inject 60 mg into the skin every 6 (six) months. Administer in upper arm, thigh, or abdomen  . fluticasone (FLONASE) 50 MCG/ACT nasal spray Place 2 sprays into both nostrils daily.  . Lutein 20 MG TABS Take one by mouth daily  . magnesium oxide (MAG-OX) 400 MG tablet Take 400 mg by mouth 2 (two) times daily.  . nitroGLYCERIN (NITROSTAT) 0.4 MG SL tablet Place 1 tablet (0.4 mg total) under the tongue every 5 (five) minutes as needed for chest pain.  . potassium chloride SA (K-DUR,KLOR-CON) 20 MEQ tablet Take 1 tablet (20 mEq total) by mouth daily.  . pravastatin (PRAVACHOL) 40 MG tablet take 1 tablet by mouth once daily  . Riboflavin 100 MG CAPS Take 1 capsule by mouth 2 (two) times daily.   Marland Kitchen triamterene-hydrochlorothiazide (MAXZIDE-25) 37.5-25 MG tablet take 1 tablet by mouth daily  . zolpidem (AMBIEN) 10 MG tablet take 1 tablet by mouth at bedtime if needed  . ALPRAZolam (XANAX) 0.5 MG tablet Take 1 tablet (0.5 mg total) by mouth at  bedtime as needed for sleep. (Patient not taking: Reported on 06/05/2016)   No facility-administered encounter medications on file as of 06/05/2016.     Activities of Daily Living In your present state of health, do you have any difficulty performing the following activities: 06/05/2016  Hearing? Y  Vision? N  Difficulty concentrating or making decisions? N  Walking or climbing stairs? N  Dressing or bathing? N  Doing errands, shopping? N  Preparing Food and eating ? N  Using the Toilet? N  In the past six months, have you accidently leaked urine? N  Do you have problems with loss of bowel control? N  Managing your Medications? N  Managing your Finances? N  Housekeeping or managing your Housekeeping? N  Some recent data might be hidden    Patient Care Team: Sherlene Shams, MD as PCP - General (Internal Medicine) Sherlene Shams, MD (Internal Medicine)      Assessment:    This is a routine wellness examination for Laura [name]. The goal of the wellness visit is to assist the patient how to close the gaps in care and create a preventative care plan for the patient.   Taking calcium VIT D and Prolia as appropriate/Osteoporosis reviewed.  Medications reviewed; taking without issues or barriers.  Safety issues reviewed; smoke detectors in the home. No firearms in the home.  Wears seatbelts when driving or riding with others. Patient does wear sunscreen or protective clothing when in direct sunlight. No violence in the home.  Depression- PHQ 2 &9 complete.  No signs/symptoms or verbal communication regarding little pleasure in doing things, feeling down, depressed or hopeless. No changes in sleeping, energy, eating, concentrating.  No thoughts of self harm or harm towards others.  Time spent on this topic is 12 minutes.   Patient is alert, normal appearance, oriented to person/place/and time. Correctly identified the president of the [place], recall of 2/3 words, and performing simple calculations.  Patient displays appropriate judgement and can read correct time from watch face.  No new identified risk were noted.  No failures at ADL's or IADL's.   BMI- discussed the importance of a healthy diet, water intake and exercise. Educational material provided.   HTN- followed by PCP.  Dental- every six months.  Eye- Visual acuity not assessed per patient preference since they have regular follow up with the ophthalmologist.  Wears corrective lenses.  Sleep patterns- Sleeps 8 hours at night.  Wakes feeling rested.  Health maintenance gaps- closed.  Patient Concerns: None at this time. Follow up with PCP as needed.  Exercise Activities and Dietary recommendations Current Exercise Habits: Structured exercise class, Type of exercise: calisthenics;walking, Time (Minutes): 60, Frequency (Times/Week): 5, Weekly Exercise (Minutes/Week): 300, Intensity:  Intense  Goals    . Healthy Lifestyle          Increase water intake (set an alarm as a reminder) Low carb foods Stay active and continue exercise regimen      Fall Risk Fall Risk  06/05/2016 12/01/2015 10/27/2014 10/30/2013  Falls in the past year? No No Yes -  Number falls in past yr: - - 1 1  Injury with Fall? - - No No  Risk for fall due to : - - Other (Comment) -  Risk for fall due to (comments): - - fell over a chair backing up -  Follow up - - Education provided -   Depression Screen PHQ 2/9 Scores 06/05/2016 12/01/2015 10/27/2014 10/30/2013  PHQ - 2 Score 0 0  0 0  PHQ- 9 Score 0 - - -     Cognitive Function MMSE - Mini Mental State Exam 06/05/2016  Orientation to time 5  Orientation to Place 5  Registration 3  Attention/ Calculation 5  Recall 3  Language- name 2 objects 2  Language- repeat 1  Language- follow 3 step command 3  Language- read & follow direction 1  Write a sentence 1  Copy design 1  Total score 30        Immunization History  Administered Date(s) Administered  . Influenza, High Dose Seasonal PF 10/27/2014, 10/05/2015  . Influenza,inj,Quad PF,36+ Mos 10/17/2012, 10/30/2013  . Pneumococcal Conjugate-13 10/30/2013  . Pneumococcal Polysaccharide-23 12/30/2010  . Tdap 02/19/2013  . Zoster 01/01/2008   Screening Tests Health Maintenance  Topic Date Due  . INFLUENZA VACCINE  08/29/2016  . TETANUS/TDAP  02/20/2023  . DEXA SCAN  Completed  . PNA vac Low Risk Adult  Completed      Plan:    End of life planning; Advance aging; Advanced directives discussed. Copy of current HCPOA/Living Will requested.    I have personally reviewed and noted the following in the patient's chart:   . Medical and social history . Use of alcohol, tobacco or illicit drugs  . Current medications and supplements . Functional ability and status . Nutritional status . Physical activity . Advanced directives . List of other physicians . Hospitalizations, surgeries,  and ER visits in previous 12 months . Vitals . Screenings to include cognitive, depression, and falls . Referrals and appointments  In addition, I have reviewed and discussed with patient certain preventive protocols, quality metrics, and best practice recommendations. A written personalized care plan for preventive services as well as general preventive health recommendations were provided to patient.     Ashok PallOBrien-Blaney, Lilyian Quayle L, LPN  3/0/86575/08/2016

## 2016-06-05 NOTE — Patient Instructions (Addendum)
  [name] , Thank you for taking time to come for your Medicare Wellness Visit. I appreciate your ongoing commitment to your health goals. Please review the following plan we discussed and let me know if I can assist you in the future.   Follow up with Dr. Darrick Huntsmanullo as needed.    Bring a copy of your Health Care Power of Attorney and/or Living Will to be scanned into chart.  Have a great day!  These are the goals we discussed: Goals    . Healthy Lifestyle          Increase water intake (set an alarm as a reminder) Low carb foods Stay active and continue exercise regimen       This is a list of the screening recommended for you and due dates:  Health Maintenance  Topic Date Due  . Flu Shot  08/29/2016  . Tetanus Vaccine  02/20/2023  . DEXA scan (bone density measurement)  Completed  . Pneumonia vaccines  Completed

## 2016-06-07 ENCOUNTER — Other Ambulatory Visit: Payer: Self-pay | Admitting: Internal Medicine

## 2016-06-08 ENCOUNTER — Other Ambulatory Visit: Payer: Self-pay | Admitting: Internal Medicine

## 2016-06-11 ENCOUNTER — Encounter: Payer: Self-pay | Admitting: Internal Medicine

## 2016-06-27 NOTE — Telephone Encounter (Signed)
Seen [phone] 

## 2016-07-06 ENCOUNTER — Other Ambulatory Visit: Payer: Self-pay | Admitting: Internal Medicine

## 2016-07-30 ENCOUNTER — Encounter: Payer: Self-pay | Admitting: Internal Medicine

## 2016-07-30 ENCOUNTER — Ambulatory Visit (INDEPENDENT_AMBULATORY_CARE_PROVIDER_SITE_OTHER): Payer: Medicare Other | Admitting: Internal Medicine

## 2016-07-30 VITALS — BP 116/76 | HR 91 | Temp 98.1°F | Resp 16 | Ht 63.5 in | Wt 147.8 lb

## 2016-07-30 DIAGNOSIS — E876 Hypokalemia: Secondary | ICD-10-CM | POA: Diagnosis not present

## 2016-07-30 DIAGNOSIS — I10 Essential (primary) hypertension: Secondary | ICD-10-CM | POA: Diagnosis not present

## 2016-07-30 DIAGNOSIS — R5383 Other fatigue: Secondary | ICD-10-CM | POA: Diagnosis not present

## 2016-07-30 DIAGNOSIS — E785 Hyperlipidemia, unspecified: Secondary | ICD-10-CM

## 2016-07-30 DIAGNOSIS — Z0000 Encounter for general adult medical examination without abnormal findings: Secondary | ICD-10-CM

## 2016-07-30 DIAGNOSIS — M810 Age-related osteoporosis without current pathological fracture: Secondary | ICD-10-CM | POA: Diagnosis not present

## 2016-07-30 DIAGNOSIS — E559 Vitamin D deficiency, unspecified: Secondary | ICD-10-CM

## 2016-07-30 DIAGNOSIS — E538 Deficiency of other specified B group vitamins: Secondary | ICD-10-CM | POA: Diagnosis not present

## 2016-07-30 LAB — CBC WITH DIFFERENTIAL/PLATELET
BASOS ABS: 0 10*3/uL (ref 0.0–0.1)
Basophils Relative: 0.4 % (ref 0.0–3.0)
EOS ABS: 0.4 10*3/uL (ref 0.0–0.7)
Eosinophils Relative: 4.8 % (ref 0.0–5.0)
HCT: 41.3 % (ref 36.0–46.0)
HEMOGLOBIN: 13.9 g/dL (ref 12.0–15.0)
Lymphocytes Relative: 30.9 % (ref 12.0–46.0)
Lymphs Abs: 2.6 10*3/uL (ref 0.7–4.0)
MCHC: 33.7 g/dL (ref 30.0–36.0)
MCV: 88.8 fl (ref 78.0–100.0)
MONO ABS: 0.7 10*3/uL (ref 0.1–1.0)
Monocytes Relative: 7.6 % (ref 3.0–12.0)
NEUTROS PCT: 56.3 % (ref 43.0–77.0)
Neutro Abs: 4.8 10*3/uL (ref 1.4–7.7)
Platelets: 288 10*3/uL (ref 150.0–400.0)
RBC: 4.66 Mil/uL (ref 3.87–5.11)
RDW: 13.5 % (ref 11.5–15.5)
WBC: 8.5 10*3/uL (ref 4.0–10.5)

## 2016-07-30 LAB — COMPREHENSIVE METABOLIC PANEL
ALK PHOS: 46 U/L (ref 39–117)
ALT: 13 U/L (ref 0–35)
AST: 16 U/L (ref 0–37)
Albumin: 4.6 g/dL (ref 3.5–5.2)
BUN: 16 mg/dL (ref 6–23)
CALCIUM: 9.5 mg/dL (ref 8.4–10.5)
CO2: 31 mEq/L (ref 19–32)
Chloride: 103 mEq/L (ref 96–112)
Creatinine, Ser: 0.75 mg/dL (ref 0.40–1.20)
GFR: 78.69 mL/min (ref >60.00)
GLUCOSE: 93 mg/dL (ref 70–99)
Potassium: 4.2 mEq/L (ref 3.5–5.1)
Sodium: 140 mEq/L (ref 135–145)
TOTAL PROTEIN: 7.3 g/dL (ref 6.0–8.3)
Total Bilirubin: 0.4 mg/dL (ref 0.2–1.2)

## 2016-07-30 LAB — LIPID PANEL
CHOLESTEROL: 227 mg/dL — AB (ref 0–200)
HDL: 95.9 mg/dL (ref >39.00)
LDL CALC: 103 mg/dL — AB (ref 0–99)
NonHDL: 130.6
TRIGLYCERIDES: 138 mg/dL (ref 0.0–149.0)
Total CHOL/HDL Ratio: 2
VLDL: 27.6 mg/dL (ref 0.0–40.0)

## 2016-07-30 LAB — MAGNESIUM: MAGNESIUM: 2.4 mg/dL (ref 1.5–2.5)

## 2016-07-30 LAB — VITAMIN B12

## 2016-07-30 LAB — TSH: TSH: 2.58 u[IU]/mL (ref 0.35–4.50)

## 2016-07-30 LAB — VITAMIN D 25 HYDROXY (VIT D DEFICIENCY, FRACTURES): VITD: 38.23 ng/mL (ref 30.00–100.00)

## 2016-07-30 MED ORDER — CHLORHEXIDINE GLUCONATE 0.12% ORAL RINSE (MEDLINE KIT): 15.0000 mL | Freq: Two times a day (BID) | OROMUCOSAL | 0 refills | Status: DC

## 2016-07-30 NOTE — Patient Instructions (Signed)
Chlorhexidine rinse:  Use twice daily for jaw pain associated with your bad tooth  I am free next Thursday for lunch if you are!!   Health Maintenance for Postmenopausal Women Menopause is a normal process in which your reproductive ability comes to an end. This process happens gradually over a span of months to years, usually between the ages of 58 and 9. Menopause is complete when you have missed 12 consecutive menstrual periods. It is important to talk with your health care provider about some of the most common conditions that affect postmenopausal women, such as heart disease, cancer, and bone loss (osteoporosis). Adopting a healthy lifestyle and getting preventive care can help to promote your health and wellness. Those actions can also lower your chances of developing some of these common conditions. What should I know about menopause? During menopause, you may experience a number of symptoms, such as:  Moderate-to-severe hot flashes.  Night sweats.  Decrease in sex drive.  Mood swings.  Headaches.  Tiredness.  Irritability.  Memory problems.  Insomnia.  Choosing to treat or not to treat menopausal changes is an individual decision that you make with your health care provider. What should I know about hormone replacement therapy and supplements? Hormone therapy products are effective for treating symptoms that are associated with menopause, such as hot flashes and night sweats. Hormone replacement carries certain risks, especially as you become older. If you are thinking about using estrogen or estrogen with progestin treatments, discuss the benefits and risks with your health care provider. What should I know about heart disease and stroke? Heart disease, heart attack, and stroke become more likely as you age. This may be due, in part, to the hormonal changes that your body experiences during menopause. These can affect how your body processes dietary fats, triglycerides,  and cholesterol. Heart attack and stroke are both medical emergencies. There are many things that you can do to help prevent heart disease and stroke:  Have your blood pressure checked at least every 1-2 years. High blood pressure causes heart disease and increases the risk of stroke.  If you are 85-85 years old, ask your health care provider if you should take aspirin to prevent a heart attack or a stroke.  Do not use any tobacco products, including cigarettes, chewing tobacco, or electronic cigarettes. If you need help quitting, ask your health care provider.  It is important to eat a healthy diet and maintain a healthy weight. ? Be sure to include plenty of vegetables, fruits, low-fat dairy products, and lean protein. ? Avoid eating foods that are high in solid fats, added sugars, or salt (sodium).  Get regular exercise. This is one of the most important things that you can do for your health. ? Try to exercise for at least 150 minutes each week. The type of exercise that you do should increase your heart rate and make you sweat. This is known as moderate-intensity exercise. ? Try to do strengthening exercises at least twice each week. Do these in addition to the moderate-intensity exercise.  Know your numbers.Ask your health care provider to check your cholesterol and your blood glucose. Continue to have your blood tested as directed by your health care provider.  What should I know about cancer screening? There are several types of cancer. Take the following steps to reduce your risk and to catch any cancer development as early as possible. Breast Cancer  Practice breast self-awareness. ? This means understanding how your breasts normally appear and  feel. ? It also means doing regular breast self-exams. Let your health care provider know about any changes, no matter how small.  If you are 42 or older, have a clinician do a breast exam (clinical breast exam or CBE) every year.  Depending on your age, family history, and medical history, it may be recommended that you also have a yearly breast X-ray (mammogram).  If you have a family history of breast cancer, talk with your health care provider about genetic screening.  If you are at high risk for breast cancer, talk with your health care provider about having an MRI and a mammogram every year.  Breast cancer (BRCA) gene test is recommended for women who have family members with BRCA-related cancers. Results of the assessment will determine the need for genetic counseling and BRCA1 and for BRCA2 testing. BRCA-related cancers include these types: ? Breast. This occurs in males or females. ? Ovarian. ? Tubal. This may also be called fallopian tube cancer. ? Cancer of the abdominal or pelvic lining (peritoneal cancer). ? Prostate. ? Pancreatic.  Cervical, Uterine, and Ovarian Cancer Your health care provider may recommend that you be screened regularly for cancer of the pelvic organs. These include your ovaries, uterus, and vagina. This screening involves a pelvic exam, which includes checking for microscopic changes to the surface of your cervix (Pap test).  For women ages 21-65, health care providers may recommend a pelvic exam and a Pap test every three years. For women ages 62-65, they may recommend the Pap test and pelvic exam, combined with testing for human papilloma virus (HPV), every five years. Some types of HPV increase your risk of cervical cancer. Testing for HPV may also be done on women of any age who have unclear Pap test results.  Other health care providers may not recommend any screening for nonpregnant women who are considered low risk for pelvic cancer and have no symptoms. Ask your health care provider if a screening pelvic exam is right for you.  If you have had past treatment for cervical cancer or a condition that could lead to cancer, you need Pap tests and screening for cancer for at least 20  years after your treatment. If Pap tests have been discontinued for you, your risk factors (such as having a new sexual partner) need to be reassessed to determine if you should start having screenings again. Some women have medical problems that increase the chance of getting cervical cancer. In these cases, your health care provider may recommend that you have screening and Pap tests more often.  If you have a family history of uterine cancer or ovarian cancer, talk with your health care provider about genetic screening.  If you have vaginal bleeding after reaching menopause, tell your health care provider.  There are currently no reliable tests available to screen for ovarian cancer.  Lung Cancer Lung cancer screening is recommended for adults 60-29 years old who are at high risk for lung cancer because of a history of smoking. A yearly low-dose CT scan of the lungs is recommended if you:  Currently smoke.  Have a history of at least 30 pack-years of smoking and you currently smoke or have quit within the past 15 years. A pack-year is smoking an average of one pack of cigarettes per day for one year.  Yearly screening should:  Continue until it has been 15 years since you quit.  Stop if you develop a health problem that would prevent you from having  lung cancer treatment.  Colorectal Cancer  This type of cancer can be detected and can often be prevented.  Routine colorectal cancer screening usually begins at age 57 and continues through age 19.  If you have risk factors for colon cancer, your health care provider may recommend that you be screened at an earlier age.  If you have a family history of colorectal cancer, talk with your health care provider about genetic screening.  Your health care provider may also recommend using home test kits to check for hidden blood in your stool.  A small camera at the end of a tube can be used to examine your colon directly (sigmoidoscopy or  colonoscopy). This is done to check for the earliest forms of colorectal cancer.  Direct examination of the colon should be repeated every 5-10 years until age 49. However, if early forms of precancerous polyps or small growths are found or if you have a family history or genetic risk for colorectal cancer, you may need to be screened more often.  Skin Cancer  Check your skin from head to toe regularly.  Monitor any moles. Be sure to tell your health care provider: ? About any new moles or changes in moles, especially if there is a change in a mole's shape or color. ? If you have a mole that is larger than the size of a pencil eraser.  If any of your family members has a history of skin cancer, especially at a young age, talk with your health care provider about genetic screening.  Always use sunscreen. Apply sunscreen liberally and repeatedly throughout the day.  Whenever you are outside, protect yourself by wearing long sleeves, pants, a wide-brimmed hat, and sunglasses.  What should I know about osteoporosis? Osteoporosis is a condition in which bone destruction happens more quickly than new bone creation. After menopause, you may be at an increased risk for osteoporosis. To help prevent osteoporosis or the bone fractures that can happen because of osteoporosis, the following is recommended:  If you are 105-27 years old, get at least 1,000 mg of calcium and at least 600 mg of vitamin D per day.  If you are older than age 10 but younger than age 93, get at least 1,200 mg of calcium and at least 600 mg of vitamin D per day.  If you are older than age 52, get at least 1,200 mg of calcium and at least 800 mg of vitamin D per day.  Smoking and excessive alcohol intake increase the risk of osteoporosis. Eat foods that are rich in calcium and vitamin D, and do weight-bearing exercises several times each week as directed by your health care provider. What should I know about how menopause  affects my mental health? Depression may occur at any age, but it is more common as you become older. Common symptoms of depression include:  Low or sad mood.  Changes in sleep patterns.  Changes in appetite or eating patterns.  Feeling an overall lack of motivation or enjoyment of activities that you previously enjoyed.  Frequent crying spells.  Talk with your health care provider if you think that you are experiencing depression. What should I know about immunizations? It is important that you get and maintain your immunizations. These include:  Tetanus, diphtheria, and pertussis (Tdap) booster vaccine.  Influenza every year before the flu season begins.  Pneumonia vaccine.  Shingles vaccine.  Your health care provider may also recommend other immunizations. This information is not intended  to replace advice given to you by your health care provider. Make sure you discuss any questions you have with your health care provider. Document Released: 03/09/2005 Document Revised: 08/05/2015 Document Reviewed: 10/19/2014 Elsevier Interactive Patient Education  2018 [employer].

## 2016-07-30 NOTE — Progress Notes (Signed)
Patient ID: Laura [name] [name], female    DOB: 26-May-1932  Age: 83 y.o. MRN: [id number]  The patient is here for annual CPE and management of other chronic and acute problems.   The risk factors are reflected in the social history.  Occasional rectal spasm,  In frequent none in weeks.   Daughter diagnosed with DCIS at 23 .  both mother had BRCA at 25. Half sister at age 22  cologuard negative 2016.   colonoscopy 2011  Negative DEXA Jan 2018.  Has red recurrent episdoes of lower right madbile pain n an area where she has a tooth growing inward after oneothe rone was removed.   The roster of all physicians providing medical care to patient - is listed in the Snapshot section of the chart.  Activities of daily living:  The patient is 100% independent in all ADLs: dressing, toileting, feeding as well as independent mobility  Home safety : The patient has smoke detectors in the home. They wear seatbelts.  There are no firearms at home. There is no violence in the home.   There is no risks for hepatitis, STDs or HIV. There is no   history of blood transfusion. They have no travel history to infectious disease endemic areas of the world.  The patient has seen their dentist in the last six month. They have seen their eye doctor in the last year. They admit to slight hearing difficulty with regard to whispered voices and some television programs.  They have deferred audiologic testing in the last year.  They do not  have excessive sun exposure. Discussed the need for sun protection: hats, long sleeves and use of sunscreen if there is significant sun exposure.   Diet: the importance of a healthy diet is discussed. They do have a healthy diet.  The benefits of regular aerobic exercise were discussed. She walks 4 times per week ,  20 minutes.   Depression screen: there are no signs or vegative symptoms of depression- irritability, change in appetite, anhedonia, sadness/tearfullness.  Cognitive assessment:  the patient manages all their financial and personal affairs and is actively engaged. They could relate day,date,year and events; recalled 2/3 objects at 3 minutes; performed clock-face test normally.  The following portions of the patient's history were reviewed and updated as appropriate: allergies, current medications, past family history, past medical history,  past surgical history, past social history  and problem list.  Visual acuity was not assessed per patient preference since she has regular follow up with her ophthalmologist. Hearing and body mass index were assessed and reviewed.   During the course of the visit the patient was educated and counseled about appropriate screening and preventive services including : fall prevention , diabetes screening, nutrition counseling, colorectal cancer screening, and recommended immunizations.    CC: The primary encounter diagnosis was Hyperlipidemia with target LDL less than 130. Diagnoses of B12 deficiency, Essential hypertension, Hypokalemia, Vitamin D deficiency, Fatigue, unspecified type, Encounter for preventive health examination, and Age-related osteoporosis without current pathological fracture were also pertinent to this visit.   Cc: 1)  Fatigue: staying physically very active,  Feels tired by the end of day  Denies shortness of breath and chest pain.  sleeping well.   2) recent episode of right mandibular pain.  Concerned about continuining Prolia due to association with osteonecrosis of the jaw.   Had a dental evaluation,  No diagnosis of OCN made,  Has a bad tooth on that sie ,  Has to  keep gums irrigated.    [name] [name] has a past medical history of Carotid stenosis; High cholesterol; Hypertension; Migraine with aura; and Osteoporosis.   She has a past surgical history that includes Tubal ligation; Abdominal hysterectomy; Tonsillectomy; and Appendectomy.   Her family history includes Breast cancer (age of onset: 5) in her sister;  Breast cancer (age of onset: 61) in her mother; Cancer in her daughter and mother; Heart disease in her mother. She was adopted.She reports that she quit smoking about 35 years ago. She has never used smokeless tobacco. She reports that she drinks about 8.4 oz of alcohol per week . She reports that she does not use drugs.  Outpatient Medications Prior to Visit  Medication Sig Dispense Refill  . aspirin 325 MG tablet Take 325 mg by mouth daily.    . calcium carbonate (OS-CAL) 600 MG TABS Take 600 mg by mouth 2 (two) times daily with a meal.    . Cholecalciferol (VITAMIN D3) 1000 UNITS CAPS Take one by mouth twice a day.    . Coenzyme Q10 (CO Q 10) 100 MG CAPS Take 1 capsule by mouth daily.    . Cyanocobalamin 2500 MCG SUBL PLACE 1 TABLET UNDER THE TONGUE DAILY AS DIRECTED 90 tablet 1  . denosumab (PROLIA) 60 MG/ML SOLN injection Inject 60 mg into the skin every 6 (six) months. Administer in upper arm, thigh, or abdomen    . fluticasone (FLONASE) 50 MCG/ACT nasal spray Place 2 sprays into both nostrils daily. 16 g 3  . Lutein 20 MG TABS Take one by mouth daily    . magnesium oxide (MAG-OX) 400 MG tablet Take 400 mg by mouth 2 (two) times daily.    . nitroGLYCERIN (NITROSTAT) 0.4 MG SL tablet Place 1 tablet (0.4 mg total) under the tongue every 5 (five) minutes as needed for chest pain. 50 tablet 3  . pravastatin (PRAVACHOL) 40 MG tablet take 1 tablet by mouth once daily 90 tablet 1  . Riboflavin 100 MG CAPS Take 1 capsule by mouth 2 (two) times daily.     Marland Kitchen triamterene-hydrochlorothiazide (MAXZIDE-25) 37.5-25 MG tablet take 1 tablet by mouth once daily 90 tablet 0  . zolpidem (AMBIEN) 10 MG tablet take 1 tablet by mouth at bedtime if needed 30 tablet 5  . ALPRAZolam (XANAX) 0.5 MG tablet Take 1 tablet (0.5 mg total) by mouth at bedtime as needed for sleep. 30 tablet 3  . potassium chloride SA (K-DUR,KLOR-CON) 20 MEQ tablet Take 1 tablet (20 mEq total) by mouth daily. 30 tablet 0  . benzonatate  (TESSALON) 100 MG capsule Take 1 capsule (100 mg total) by mouth 2 (two) times daily as needed for cough. 20 capsule 0   No facility-administered medications prior to visit.     Review of Systems   Patient denies headache, fevers, malaise, unintentional weight loss, skin rash, eye pain, sinus congestion and sinus pain, sore throat, dysphagia,  hemoptysis , cough, dyspnea, wheezing, chest pain, palpitations, orthopnea, edema, abdominal pain, nausea, melena, diarrhea, constipation, flank pain, dysuria, hematuria, urinary  Frequency, nocturia, numbness, tingling, seizures,  Focal weakness, Loss of consciousness,  Tremor, insomnia, depression, anxiety, and suicidal ideation.      Objective:  BP 116/76   Pulse 91   Temp 98.1 F (36.7 C) (Oral)   Resp 16   Ht 5' 3.5" (1.613 m)   Wt 147 lb 12.8 oz (67 kg)   SpO2 98%   BMI 25.77 kg/m   Physical Exam   .  General Appearance:    Alert, cooperative, no distress, appears stated age  Head:    Normocephalic, without obvious abnormality, atraumatic  Eyes:    PERRL, conjunctiva/corneas clear, EOM's intact, fundi    benign, both eyes  Ears:    Normal TM's and external ear canals, both ears  Nose:   Nares normal, septum midline, mucosa normal, no drainage    or sinus tenderness  Throat:   Lips, mucosa, and tongue normal; teeth and gums normal  Neck:   Supple, symmetrical, trachea midline, no adenopathy;    thyroid:  no enlargement/tenderness/nodules; no carotid   bruit or JVD  Back:     Symmetric, no curvature, ROM normal, no CVA tenderness  Lungs:     Clear to auscultation bilaterally, respirations unlabored  Chest Wall:    No tenderness or deformity   Heart:    Regular rate and rhythm, S1 and S2 normal, no murmur, rub   or gallop  Breast Exam:    No tenderness, masses, or nipple abnormality  Abdomen:     Soft, non-tender, bowel sounds active all four quadrants,    no masses, no organomegaly  Rectal:     rectovaginal septum normal, rectal  exam normal except for internal hemorrhoids   Extremities:   Extremities normal, atraumatic, no cyanosis or edema  Pulses:   2+ and symmetric all extremities  Skin:   Skin color, texture, turgor normal, no rashes or lesions  Lymph nodes:   Cervical, supraclavicular, and axillary nodes normal  Neurologic:   CNII-XII intact, normal strength, sensation and reflexes    throughout      Assessment & Plan:   Problem List Items Addressed This Visit    Osteoporosis    Reviewed the incidence of OCN jaw with use of Prolia  ( < 1%) and the observations that occurrence in patients who do not have a concurrent diagnosis of cancer is rare      Hyperlipidemia with target LDL less than 130 - Primary    She has noncritical carotid stenosis (Left ICA 83 yo 69% by 2016 [place]) and is 83 yrs old with an  HDL of 96.  Continue pravastatin and aspirin.  .   Lab Results  Component Value Date   CHOL 227 (H) 07/30/2016   HDL 95.90 07/30/2016   LDLCALC 103 (H) 07/30/2016   LDLDIRECT 109.7 09/27/2011   TRIG 138.0 07/30/2016   CHOLHDL 2 07/30/2016         Relevant Orders   Lipid panel (Completed)   B12 deficiency   Relevant Orders   Vitamin B12 (Completed)   HTN (hypertension)    Well controlled on current regimen. Renal function stable, no changes today.  Lab Results  Component Value Date   CREATININE 0.75 07/30/2016   Lab Results  Component Value Date   NA 140 07/30/2016   K 4.2 07/30/2016   CL 103 07/30/2016   CO2 31 07/30/2016         Relevant Orders   Comprehensive metabolic panel (Completed)   Encounter for preventive health examination    Annual comprehensive preventive exam was done as well as an evaluation and management of chronic conditions .  During the course of the visit the patient was educated and counseled about appropriate screening and preventive services including :  diabetes screening, lipid analysis with projected  10 year  risk for CAD , nutrition counseling, breast,  cervical and colorectal cancer screening, and recommended immunizations.  Printed recommendations for health maintenance  screenings was given.      Fatigue    Screening labs normal.  She has a history of snoring but is flatly not interested in screening for OSA bc "I will never use the mask." .  Recommended continuing in a  regular exercise program with goal of achieving a minimum of 30 minutes of aerobic activity 5 days per week.   Lab Results  Component Value Date   CREATININE 0.79 02/28/2015   Lab Results  Component Value Date   ALT 14 02/28/2015   AST 18 02/28/2015   ALKPHOS 69 02/28/2015   BILITOT 0.6 02/28/2015   Lab Results  Component Value Date   TSH 1.68 02/28/2015   Lab Results  Component Value Date   WBC 8.6 02/28/2015   HGB 14.7 02/28/2015   HCT 43.8 02/28/2015   MCV 92.7 02/28/2015   PLT 248.0 02/28/2015        Relevant Orders   TSH (Completed)   CBC with Differential/Platelet (Completed)   RESOLVED: Hypokalemia   Relevant Orders   Magnesium (Completed)    Other Visit Diagnoses    Vitamin D deficiency       Relevant Orders   VITAMIN D 25 Hydroxy (Vit-D Deficiency, Fractures) (Completed)      I have discontinued Ms. [name]'s ALPRAZolam, potassium chloride SA, and benzonatate. I am also having her start on chlorhexidine gluconate (MEDLINE KIT). Additionally, I am having her maintain her Lutein, calcium carbonate, Vitamin D3, Riboflavin, Co Q 10, denosumab, aspirin, magnesium oxide, nitroGLYCERIN, fluticasone, zolpidem, Cyanocobalamin, triamterene-hydrochlorothiazide, and pravastatin.  Meds ordered this encounter  Medications  . chlorhexidine gluconate, MEDLINE KIT, (PERIDEX) 0.12 % solution    Sig: Use as directed 15 mLs in the mouth or throat 2 (two) times daily.    Dispense:  120 mL    Refill:  0    Medications Discontinued During This Encounter  Medication Reason  . benzonatate (TESSALON) 100 MG capsule Completed Course  . ALPRAZolam (XANAX) 0.5  MG tablet   . potassium chloride SA (K-DUR,KLOR-CON) 20 MEQ tablet     Follow-up: No Follow-up on file.   Crecencio Mc, MD

## 2016-07-31 DIAGNOSIS — R5383 Other fatigue: Secondary | ICD-10-CM | POA: Insufficient documentation

## 2016-07-31 DIAGNOSIS — Z0000 Encounter for general adult medical examination without abnormal findings: Secondary | ICD-10-CM | POA: Insufficient documentation

## 2016-07-31 NOTE — Assessment & Plan Note (Addendum)
Annual comprehensive preventive exam was done as well as an evaluation and management of chronic conditions .  During the course of the visit the patient was educated and counseled about appropriate screening and preventive services including :  diabetes screening, lipid analysis with projected  10 year  risk for CAD , nutrition counseling, breast, cervical and colorectal cancer screening, and recommended immunizations.  Printed recommendations for health maintenance screenings was given 

## 2016-07-31 NOTE — Assessment & Plan Note (Signed)
Reviewed the incidence of OCN jaw with use of Prolia  ( < 1%) and the observations that occurrence in patients who do not have a concurrent diagnosis of cancer is rare

## 2016-07-31 NOTE — Assessment & Plan Note (Signed)
Screening labs normal.  She has a history of snoring but is flatly not interested in screening for OSA bc "I will never use the mask." .  Recommended continuing in a  regular exercise program with goal of achieving a minimum of 30 minutes of aerobic activity 5 days per week.   Lab Results  Component Value Date   CREATININE 0.79 02/28/2015   Lab Results  Component Value Date   ALT 14 02/28/2015   AST 18 02/28/2015   ALKPHOS 69 02/28/2015   BILITOT 0.6 02/28/2015   Lab Results  Component Value Date   TSH 1.68 02/28/2015   Lab Results  Component Value Date   WBC 8.6 02/28/2015   HGB 14.7 02/28/2015   HCT 43.8 02/28/2015   MCV 92.7 02/28/2015   PLT 248.0 02/28/2015

## 2016-07-31 NOTE — Assessment & Plan Note (Signed)
She has noncritical carotid stenosis (Left ICA 83 yo 69% by 2016 [place]) and is 83 yrs old with an  HDL of 96.  Continue pravastatin and aspirin.  .   Lab Results  Component Value Date   CHOL 227 (H) 07/30/2016   HDL 95.90 07/30/2016   LDLCALC 103 (H) 07/30/2016   LDLDIRECT 109.7 09/27/2011   TRIG 138.0 07/30/2016   CHOLHDL 2 07/30/2016

## 2016-07-31 NOTE — Assessment & Plan Note (Signed)
Well controlled on current regimen. Renal function stable, no changes today.  Lab Results  Component Value Date   CREATININE 0.75 07/30/2016   Lab Results  Component Value Date   NA 140 07/30/2016   K 4.2 07/30/2016   CL 103 07/30/2016   CO2 31 07/30/2016

## 2016-10-22 ENCOUNTER — Telehealth: Payer: Self-pay | Admitting: Internal Medicine

## 2016-10-22 NOTE — Telephone Encounter (Signed)
Pt called to schedule her Prolia injection. Please advise?  Call pt @ [phone]. Thank you!

## 2016-10-22 NOTE — Telephone Encounter (Signed)
Spoke with patient and scheduled, thanks 

## 2016-10-24 ENCOUNTER — Ambulatory Visit (INDEPENDENT_AMBULATORY_CARE_PROVIDER_SITE_OTHER): Payer: Medicare Other

## 2016-10-24 DIAGNOSIS — Z23 Encounter for immunization: Secondary | ICD-10-CM

## 2016-10-24 DIAGNOSIS — M810 Age-related osteoporosis without current pathological fracture: Secondary | ICD-10-CM

## 2016-10-24 MED ORDER — DENOSUMAB 60 MG/ML ~~LOC~~ SOLN: 60.0000 mg | Freq: Once | SUBCUTANEOUS | Status: AC

## 2016-10-24 NOTE — Progress Notes (Signed)
Patient comes in for Prolia injection . Subcuteously injected right  arm.   Patient tolerated injection well. Flu shot given in left deltoid.

## 2016-11-25 ENCOUNTER — Other Ambulatory Visit: Payer: Self-pay | Admitting: Internal Medicine

## 2016-11-29 ENCOUNTER — Telehealth: Payer: Self-pay | Admitting: Internal Medicine

## 2016-11-29 DIAGNOSIS — K117 Disturbances of salivary secretion: Secondary | ICD-10-CM | POA: Insufficient documentation

## 2016-12-03 NOTE — Telephone Encounter (Signed)
Patient notified

## 2017-01-12 ENCOUNTER — Other Ambulatory Visit: Payer: Self-pay | Admitting: Internal Medicine

## 2017-03-14 ENCOUNTER — Telehealth: Payer: Self-pay | Admitting: Internal Medicine

## 2017-03-14 NOTE — Telephone Encounter (Signed)
Copied from CRM [phone]. Topic: Inquiry >> Mar 14, 2017 11:40 AM Laura [name], Laura [name], NT wrote: Reason for CRM: Patient needs a prior approval to have a Prolia Injection done. If someone could give her a call back when this is taken care of for her at [phone]. Also patient would like to have a shingles shot when she come into the office to get the prolia as well

## 2017-03-14 NOTE — Telephone Encounter (Signed)
Patient to receive shingrix at pharmacy.

## 2017-03-14 NOTE — Telephone Encounter (Signed)
Insurance verification for Prolia filed on [employer] Portal. 

## 2017-03-20 ENCOUNTER — Ambulatory Visit: Payer: Medicare Other | Admitting: Internal Medicine

## 2017-03-20 ENCOUNTER — Encounter: Payer: Self-pay | Admitting: Internal Medicine

## 2017-03-20 VITALS — BP 120/66 | HR 83 | Temp 98.1°F | Resp 15 | Ht 63.5 in | Wt 149.4 lb

## 2017-03-20 DIAGNOSIS — I10 Essential (primary) hypertension: Secondary | ICD-10-CM

## 2017-03-20 DIAGNOSIS — F411 Generalized anxiety disorder: Secondary | ICD-10-CM | POA: Diagnosis not present

## 2017-03-20 DIAGNOSIS — M810 Age-related osteoporosis without current pathological fracture: Secondary | ICD-10-CM | POA: Diagnosis not present

## 2017-03-20 DIAGNOSIS — K130 Diseases of lips: Secondary | ICD-10-CM | POA: Diagnosis not present

## 2017-03-20 DIAGNOSIS — K117 Disturbances of salivary secretion: Secondary | ICD-10-CM | POA: Diagnosis not present

## 2017-03-20 DIAGNOSIS — E785 Hyperlipidemia, unspecified: Secondary | ICD-10-CM

## 2017-03-20 LAB — COMPREHENSIVE METABOLIC PANEL
ALT: 14 U/L (ref 0–35)
AST: 16 U/L (ref 0–37)
Albumin: 4.4 g/dL (ref 3.5–5.2)
Alkaline Phosphatase: 42 U/L (ref 39–117)
BILIRUBIN TOTAL: 0.3 mg/dL (ref 0.2–1.2)
BUN: 18 mg/dL (ref 6–23)
CALCIUM: 9.6 mg/dL (ref 8.4–10.5)
CHLORIDE: 103 meq/L (ref 96–112)
CO2: 31 mEq/L (ref 19–32)
CREATININE: 0.78 mg/dL (ref 0.40–1.20)
GFR: 75.09 mL/min (ref >60.00)
Glucose, Bld: 86 mg/dL (ref 70–99)
Potassium: 4.2 mEq/L (ref 3.5–5.1)
Sodium: 139 mEq/L (ref 135–145)
TOTAL PROTEIN: 7.2 g/dL (ref 6.0–8.3)

## 2017-03-20 MED ORDER — AMITRIPTYLINE HCL 25 MG PO TABS: 150.0000 mg | ORAL_TABLET | Freq: Every day | ORAL | 1 refills | Status: DC

## 2017-03-20 MED ORDER — ZOSTER VAC RECOMB ADJUVANTED 50 MCG/0.5ML IM SUSR: 0.5000 mL | Freq: Once | INTRAMUSCULAR | 1 refills | Status: AC

## 2017-03-20 MED ORDER — ALPRAZOLAM 0.5 MG PO TABS: 0.5000 mg | ORAL_TABLET | Freq: Every evening | ORAL | 5 refills | Status: DC | PRN

## 2017-03-20 NOTE — Progress Notes (Signed)
Subjective:  Patient ID: Laura [name], female    DOB: 27-Feb-1932  Age: 84 y.o. MRN: [id number]  CC: The primary encounter diagnosis was Hyperlipidemia with target LDL less than 130. Diagnoses of Anxiety state, Angular cheilitis, Sialorrhea, Age-related osteoporosis without current pathological fracture, and Essential hypertension were also pertinent to this visit.  HPI Laura [name] presents for follow up on hyperlipidemia, hypertension,  And osteoporosis  She is due for Prolia injection , awaiting approval since Feb 14 call   excessive salivation. Going on for the last several months,  No drooling,  Just notices that the corners of her mouth are moist and she has to dab at them.  Notices that sides of mouth are downturned due to aging.    History of TMJ , feels that her lower jaw has receded but has not discussed with dentist.  Did not improve with prilosec .   Increased   anxiety .  In midst of planning a trip to [place] and [place]  With granddaughter who does not respond to texts in a timely matter and plans to leaver her in [place] and g off to [place] with a friend.   Lab Results  Component Value Date   NA 139 03/20/2017   K 4.2 03/20/2017   CL 103 03/20/2017   CO2 31 03/20/2017      Outpatient Medications Prior to Visit  Medication Sig Dispense Refill  . aspirin EC 81 MG tablet Take 81 mg by mouth daily.    . calcium carbonate (OS-CAL) 600 MG TABS Take 600 mg by mouth 2 (two) times daily with a meal.    . chlorhexidine gluconate, MEDLINE KIT, (PERIDEX) 0.12 % solution Use as directed 15 mLs in the mouth or throat 2 (two) times daily. 120 mL 0  . Cholecalciferol (VITAMIN D3) 1000 UNITS CAPS Take one by mouth twice a day.    . Coenzyme Q10 (CO Q 10) 100 MG CAPS Take 1 capsule by mouth daily.    . Cyanocobalamin 2500 MCG SUBL PLACE 1 TABLET UNDER THE TONGUE DAILY AS DIRECTED 90 tablet 1  . denosumab (PROLIA) 60 MG/ML SOLN injection Inject 60 mg into the skin every 6 (six)  months. Administer in upper arm, thigh, or abdomen    . fluticasone (FLONASE) 50 MCG/ACT nasal spray Place 2 sprays into both nostrils daily. 16 g 3  . Lutein 20 MG TABS Take one by mouth daily    . magnesium oxide (MAG-OX) 400 MG tablet Take 400 mg by mouth 2 (two) times daily.    . nitroGLYCERIN (NITROSTAT) 0.4 MG SL tablet Place 1 tablet (0.4 mg total) under the tongue every 5 (five) minutes as needed for chest pain. 50 tablet 3  . pravastatin (PRAVACHOL) 40 MG tablet take 1 tablet by mouth once daily 90 tablet 1  . Riboflavin 100 MG CAPS Take 1 capsule by mouth 2 (two) times daily.     Marland Kitchen triamterene-hydrochlorothiazide (MAXZIDE-25) 37.5-25 MG tablet take 1 tablet by mouth once daily 90 tablet 0  . zolpidem (AMBIEN) 10 MG tablet take 1 tablet by mouth at bedtime if needed 30 tablet 5  . aspirin 325 MG tablet Take 325 mg by mouth daily.     Facility-Administered Medications Prior to Visit  Medication Dose Route Frequency Provider Last Rate Last Dose  . denosumab (PROLIA) injection 60 mg  60 mg Subcutaneous Once Crecencio Mc, MD        Review of Systems;  Patient denies headache,  fevers, malaise, unintentional weight loss, skin rash, eye pain, sinus congestion and sinus pain, sore throat, dysphagia,  hemoptysis , cough, dyspnea, wheezing, chest pain, palpitations, orthopnea, edema, abdominal pain, nausea, melena, diarrhea, constipation, flank pain, dysuria, hematuria, urinary  Frequency, nocturia, numbness, tingling, seizures,  Focal weakness, Loss of consciousness,  Tremor, insomnia, depression, anxiety, and suicidal ideation.      Objective:  BP 120/66 (BP Location: Left Arm, Patient Position: Sitting, Cuff Size: Normal)   Pulse 83   Temp 98.1 F (36.7 C) (Oral)   Resp 15   Ht 5' 3.5" (1.613 m)   Wt 149 lb 6.4 oz (67.8 kg)   SpO2 97%   BMI 26.05 kg/m   BP Readings from Last 3 Encounters:  03/20/17 120/66  07/30/16 116/76  06/05/16 130/80    Wt Readings from Last 3  Encounters:  03/20/17 149 lb 6.4 oz (67.8 kg)  07/30/16 147 lb 12.8 oz (67 kg)  06/05/16 148 lb (67.1 kg)    General appearance: alert, cooperative and appears stated age Ears: normal TM's and external ear canals both ears Throat: lips, mucosa, and tongue normal; teeth and gums normal Neck: no adenopathy, no carotid bruit, supple, symmetrical, trachea midline and thyroid not enlarged, symmetric, no tenderness/mass/nodules Back: symmetric, no curvature. ROM normal. No CVA tenderness. Lungs: clear to auscultation bilaterally Heart: regular rate and rhythm, S1, S2 normal, no murmur, click, rub or gallop Abdomen: soft, non-tender; bowel sounds normal; no masses,  no organomegaly Pulses: 2+ and symmetric Skin: Skin color, texture, turgor normal. No rashes or lesions Lymph nodes: Cervical, supraclavicular, and axillary nodes normal.  Lab Results  Component Value Date   HGBA1C 5.4 05/05/2014    Lab Results  Component Value Date   CREATININE 0.78 03/20/2017   CREATININE 0.75 07/30/2016   CREATININE 0.74 04/22/2015    Lab Results  Component Value Date   WBC 8.5 07/30/2016   HGB 13.9 07/30/2016   HCT 41.3 07/30/2016   PLT 288.0 07/30/2016   GLUCOSE 86 03/20/2017   CHOL 227 (H) 07/30/2016   TRIG 138.0 07/30/2016   HDL 95.90 07/30/2016   LDLDIRECT 109.7 09/27/2011   LDLCALC 103 (H) 07/30/2016   ALT 14 03/20/2017   AST 16 03/20/2017   NA 139 03/20/2017   K 4.2 03/20/2017   CL 103 03/20/2017   CREATININE 0.78 03/20/2017   BUN 18 03/20/2017   CO2 31 03/20/2017   TSH 2.58 07/30/2016   HGBA1C 5.4 05/05/2014    Dg Bone Density (dxa)  Result Date: 02/22/2016 EXAM: DUAL X-RAY ABSORPTIOMETRY (DXA) FOR BONE MINERAL DENSITY IMPRESSION: Dear Dr. Derrel Nip, Your patient Laura [name] completed a BMD test on 02/22/2016 using the [place] (analysis version: 14.10) manufactured by [employer]. The following summarizes the results of our evaluation. PATIENT BIOGRAPHICAL: Name:  Laura [name] Patient ID: [id number] Birth Date: [birth year] Height: 63.7 in. Gender: Female Exam Date: 02/22/2016 Weight: 147.5 lbs. Indications: Advanced Age, Caucasian, History of Osteoporosis, Hysterectomy, Oophorectomy Bilateral, Postmenopausal Fractures: Left hand, Right foot Treatments: aspirin, Calcium, pravastatin, Prolia, Vit D ASSESSMENT: The BMD measured at AP Spine L1-L2 is 0.893 g/cm2 with a T-score of -2.3. This patient is considered osteopenic according to [facility]) criteria. L-3 & 4 was excluded due to degenerative changes. Site Region Measured Measured WHO Young Adult BMD Date       Age      Classification T-score AP Spine L1-L2 02/22/2016 81.2 Osteopenia -2.3 0.893 g/cm2 AP Spine L1-L2 01/06/2014 79.1 Osteoporosis -  2.8 0.839 g/cm2 AP Spine L1-L2 11/20/2011 77.0 Osteopenia -2.2 0.904 g/cm2 DualFemur Neck Left 02/22/2016 81.2 Osteopenia -1.8 0.792 g/cm2 DualFemur Neck Left 01/06/2014 79.1 Osteopenia -1.9 0.773 g/cm2 DualFemur Neck Left 11/20/2011 77.0 Normal -0.8 0.927 g/cm2 World Health Organization [facility]) criteria for post-menopausal, Caucasian Women: Normal:       T-score at or above -1 SD Osteopenia:   T-score between -1 and -2.5 SD Osteoporosis: T-score at or below -2.5 SD RECOMMENDATIONS: [place] recommends that FDA-approved medical therapies be considered in postmenopausal women and men age 40 or older with a: 1. Hip or vertebral (clinical or morphometric) fracture. 2. T-score of < -2.5 at the spine or hip. 3. Ten-year fracture probability by FRAX of 3% or greater for hip fracture or 20% or greater for major osteoporotic fracture. All treatment decisions require clinical judgment and consideration of individual patient factors, including patient preferences, co-morbidities, previous drug use, risk factors not captured in the FRAX model (e.g. falls, vitamin D deficiency, increased bone turnover, interval significant decline in bone density) and  possible under - or over-estimation of fracture risk by FRAX. All patients should ensure an adequate intake of dietary calcium (1200 mg/d) and vitamin D (800 IU daily) unless contraindicated. FOLLOW-UP: People with diagnosed cases of osteoporosis or at high risk for fracture should have regular bone mineral density tests. For patients eligible for Medicare, routine testing is allowed once every 2 years. The testing frequency can be increased to one year for patients who have rapidly progressing disease, those who are receiving or discontinuing medical therapy to restore bone mass, or have additional risk factors. I have reviewed this report, and agree with the above findings. [facility] Electronically Signed   By: Rolm Baptise M.D.   On: 02/22/2016 15:21    Assessment & Plan:   Problem List Items Addressed This Visit    Osteoporosis    awaiting PA for next Prolia injection       Hyperlipidemia with target LDL less than 130 - Primary    Managed with pravastatin and asa,  lftrs are normal  Lab Results  Component Value Date   ALT 14 03/20/2017   AST 16 03/20/2017   ALKPHOS 42 03/20/2017   BILITOT 0.3 03/20/2017         Relevant Medications   aspirin EC 81 MG tablet   Other Relevant Orders   Comprehensive metabolic panel (Completed)   Anxiety state    Aggravated by her attempts to organize an international trip without adequate input  From her fellow travelers. She has available Low dose alprazolam for prn use but has not used it but rarely  She is asking for permission to resume prn use . A total of 25 minutes of face to face time was spent with patient more than half of which was spent in counselling about the above mentioned conditions         Relevant Medications   ALPRAZolam (XANAX) 0.5 MG tablet   amitriptyline (ELAVIL) 25 MG tablet   HTN (hypertension)    Well controlled on current regimen. Renal function stable, no changes today.  Lab Results  Component Value Date    CREATININE 0.78 03/20/2017   Lab Results  Component Value Date   NA 139 03/20/2017   K 4.2 03/20/2017   CL 103 03/20/2017   CO2 31 03/20/2017         Relevant Medications   aspirin EC 81 MG tablet   Sialorrhea    She does  not want Neurology referral since she has no other symptoms and current issue is mild.  Trial of elavil.       Angular cheilitis    Mild, secondary to increased salivation.  Trial of elavil at night since PPI failed         I have discontinued Laura [name] "B Whittingham"'s aspirin. I am also having her start on amitriptyline and Zoster Vaccine Adjuvanted. Additionally, I am having her maintain her Lutein, calcium carbonate, Vitamin D3, Riboflavin, Co Q 10, denosumab, magnesium oxide, nitroGLYCERIN, fluticasone, Cyanocobalamin, chlorhexidine gluconate (MEDLINE KIT), zolpidem, triamterene-hydrochlorothiazide, pravastatin, aspirin EC, and ALPRAZolam. We will continue to administer denosumab.  Meds ordered this encounter  Medications  . ALPRAZolam (XANAX) 0.5 MG tablet    Sig: Take 1 tablet (0.5 mg total) by mouth at bedtime as needed for sleep.    Dispense:  30 tablet    Refill:  5  . amitriptyline (ELAVIL) 25 MG tablet    Sig: Take 6 tablets (150 mg total) by mouth at bedtime.    Dispense:  90 tablet    Refill:  1  . Zoster Vaccine Adjuvanted [facility]) injection    Sig: Inject 0.5 mLs into the muscle once for 1 dose.    Dispense:  1 each    Refill:  1    Medications Discontinued During This Encounter  Medication Reason  . aspirin 325 MG tablet Patient has not taken in last 30 days    Follow-up: Return in about 6 months (around 09/17/2017).   Crecencio Mc, MD

## 2017-03-20 NOTE — Patient Instructions (Addendum)
I AM REFILLING YOUR ALPRAZOLAM!   I am also recommending a trial of amitriptyline (generic for Elavil) at take at night  Around 9:00   See if it helps the excessive saliva production     Check with [employer] about whether they will pay for the Shingrx vaccine I have printed you an rx for it

## 2017-03-21 DIAGNOSIS — K130 Diseases of lips: Secondary | ICD-10-CM | POA: Insufficient documentation

## 2017-03-21 NOTE — Assessment & Plan Note (Signed)
Well controlled on current regimen. Renal function stable, no changes today.  Lab Results  Component Value Date   CREATININE 0.78 03/20/2017   Lab Results  Component Value Date   NA 139 03/20/2017   K 4.2 03/20/2017   CL 103 03/20/2017   CO2 31 03/20/2017

## 2017-03-21 NOTE — Assessment & Plan Note (Signed)
She does not want Neurology referral since she has no other symptoms and current issue is mild.  Trial of elavil.

## 2017-03-21 NOTE — Assessment & Plan Note (Signed)
awaiting PA for next Prolia injection

## 2017-03-21 NOTE — Assessment & Plan Note (Signed)
Managed with pravastatin and asa,  lftrs are normal  Lab Results  Component Value Date   ALT 14 03/20/2017   AST 16 03/20/2017   ALKPHOS 42 03/20/2017   BILITOT 0.3 03/20/2017

## 2017-03-21 NOTE — Assessment & Plan Note (Signed)
Aggravated by her attempts to organize an international trip without adequate input  From her fellow travelers. She has available Low dose alprazolam for prn use but has not used it but rarely  She is asking for permission to resume prn use . A total of 25 minutes of face to face time was spent with patient more than half of which was spent in counselling about the above mentioned conditions

## 2017-03-21 NOTE — Assessment & Plan Note (Signed)
Mild, secondary to increased salivation.  Trial of elavil at night since PPI failed

## 2017-03-24 ENCOUNTER — Encounter: Payer: Self-pay | Admitting: Internal Medicine

## 2017-03-27 ENCOUNTER — Telehealth: Payer: Self-pay

## 2017-03-27 NOTE — Telephone Encounter (Signed)
PA submitted for amitriptyline on covermymeds.

## 2017-03-29 ENCOUNTER — Telehealth: Payer: Self-pay

## 2017-03-29 NOTE — Telephone Encounter (Signed)
PA has been submitted on covermymeds.  

## 2017-04-23 ENCOUNTER — Other Ambulatory Visit: Payer: Self-pay | Admitting: Internal Medicine

## 2017-04-24 ENCOUNTER — Other Ambulatory Visit: Payer: Self-pay | Admitting: *Deleted

## 2017-04-24 MED ORDER — ZOLPIDEM TARTRATE 10 MG PO TABS: 10.0000 mg | ORAL_TABLET | Freq: Every day | ORAL | 5 refills | Status: DC

## 2017-04-24 NOTE — Telephone Encounter (Signed)
Refilled: 11/26/2016 Last OV: 03/20/2017 Next OV: not scheduled

## 2017-04-24 NOTE — Progress Notes (Unsigned)
Refill sent for Ambien.

## 2017-04-25 ENCOUNTER — Ambulatory Visit (INDEPENDENT_AMBULATORY_CARE_PROVIDER_SITE_OTHER): Payer: Medicare Other | Admitting: *Deleted

## 2017-04-25 DIAGNOSIS — M810 Age-related osteoporosis without current pathological fracture: Secondary | ICD-10-CM

## 2017-04-26 MED ORDER — DENOSUMAB 60 MG/ML ~~LOC~~ SOLN
60.0000 mg | Freq: Once | SUBCUTANEOUS | Status: AC
  Administered 2017-04-25: 60 mg via SUBCUTANEOUS

## 2017-04-26 NOTE — Progress Notes (Signed)
Patient presented for Prolia injection to Right arm [place], patient voiced no concerns or complaints during or after injection.prolia

## 2017-04-30 NOTE — Progress Notes (Signed)
PA completed injection given

## 2017-05-01 ENCOUNTER — Ambulatory Visit: Payer: Medicare Other

## 2017-05-06 ENCOUNTER — Other Ambulatory Visit: Payer: Self-pay | Admitting: Internal Medicine

## 2017-05-07 MED ORDER — TRIAMTERENE-HCTZ 37.5-25 MG PO TABS: 1.0000 | ORAL_TABLET | Freq: Every day | ORAL | 1 refills | Status: DC

## 2017-06-06 ENCOUNTER — Ambulatory Visit: Payer: Medicare Other

## 2017-07-11 ENCOUNTER — Other Ambulatory Visit: Payer: Self-pay | Admitting: Internal Medicine

## 2017-07-29 ENCOUNTER — Telehealth: Payer: Self-pay | Admitting: Internal Medicine

## 2017-07-29 ENCOUNTER — Other Ambulatory Visit: Payer: Self-pay | Admitting: *Deleted

## 2017-07-29 MED ORDER — TRIAMTERENE-HCTZ 37.5-25 MG PO TABS: 1.0000 | ORAL_TABLET | Freq: Every day | ORAL | 0 refills | Status: DC

## 2017-07-29 NOTE — Telephone Encounter (Signed)
Copied from CRM [phone]#[id number]. Topic: Quick Communication - Rx Refill/Question >> Jul 29, 2017 10:24 AM [name], Diala Waxman A wrote: Medication: triamterene-hydrochlorothiazide (MAXZIDE-25) 37.5-25 MG tablet  Has the patient contacted their pharmacy? Yes.   (Agent: If no, request that the patient contact the pharmacy for the refill.) (Agent: If yes, when and what did the pharmacy advise?)  Preferred Pharmacy (with phone number or street name): [facility] [address], [place], [place] [id number]. Pt. Is out of town and forgot to bring her triamterene-hydrochlorothiazide (MAXZIDE-25) 37.5-25 MG tablet, pt is requesting THREE pills only while she is out of town, [facility] [address]. Please call pt. When RX has been called in.   Agent: Please be advised that RX refills may take up to 3 business days. We ask that you follow-up with your pharmacy.

## 2017-07-29 NOTE — Telephone Encounter (Signed)
Rx for 3 pills sent to pharmacy per patient request- patient notified on VM.

## 2017-08-29 IMAGING — MG MM DIGITAL SCREENING BILAT W/ TOMO W/ CAD
8 of 12 series · 8 of 28 positions shown · non-contrast
Comparison: Previous exam(s).

CLINICAL DATA: Screening.

EXAM:
2D DIGITAL SCREENING BILATERAL MAMMOGRAM WITH CAD AND ADJUNCT TOMO

[L MLO]
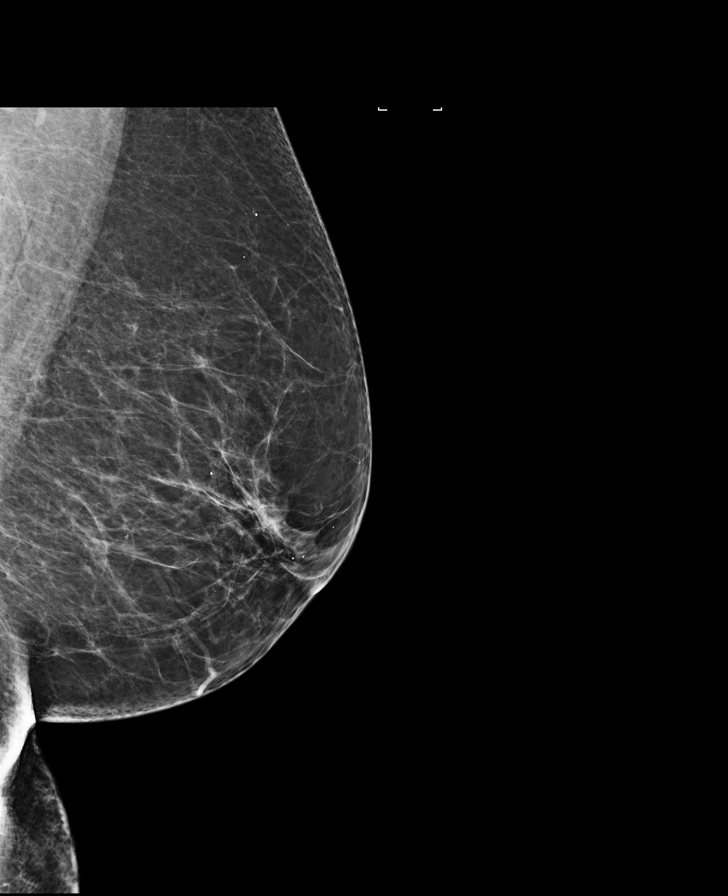

[R CC synth-2D]
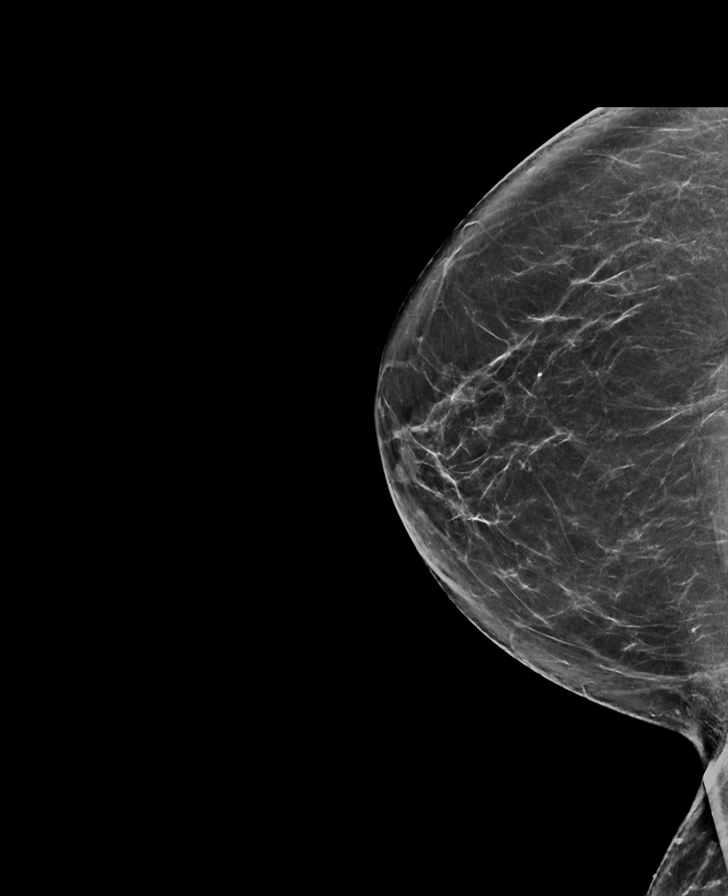

[R MLO]
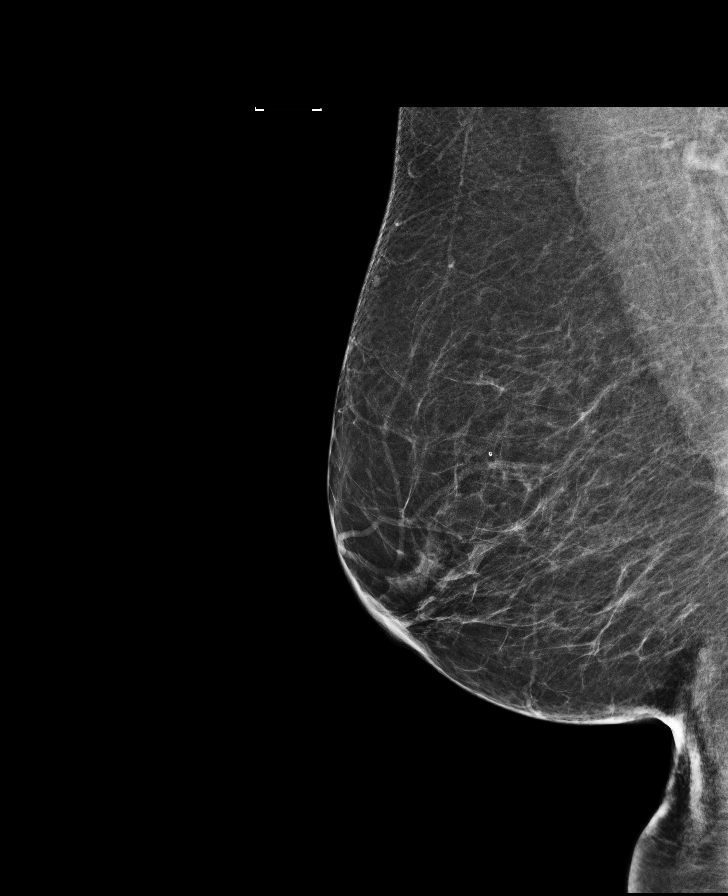

[R CC]
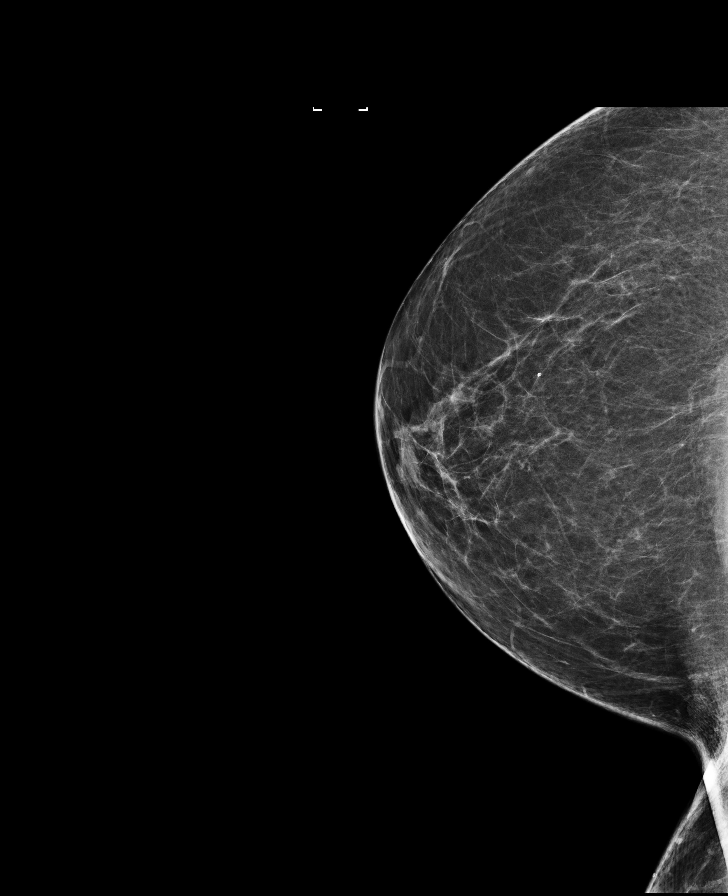

[L MLO synth-2D]
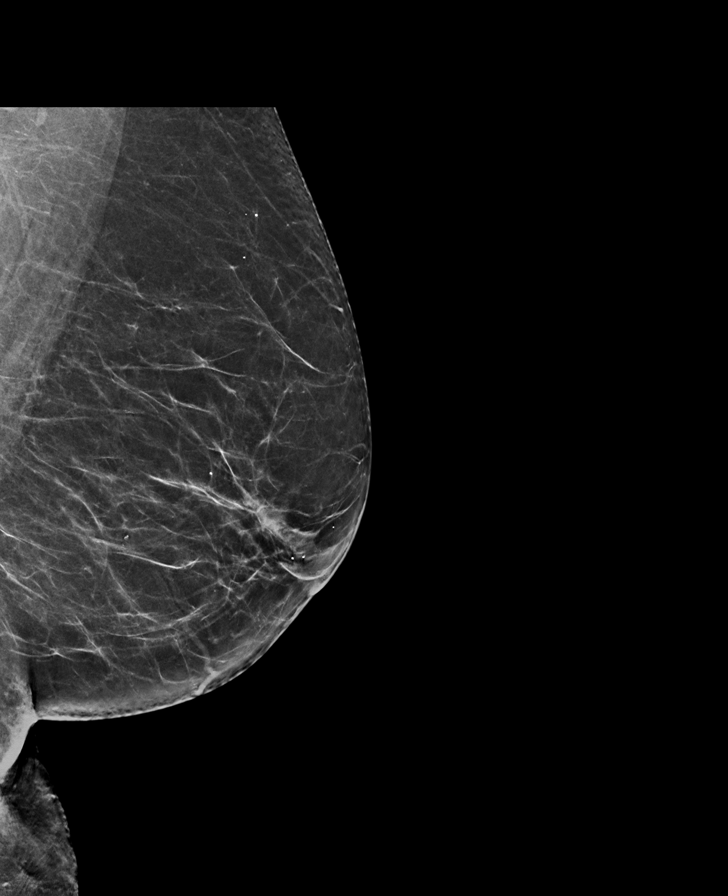

[R MLO synth-2D]
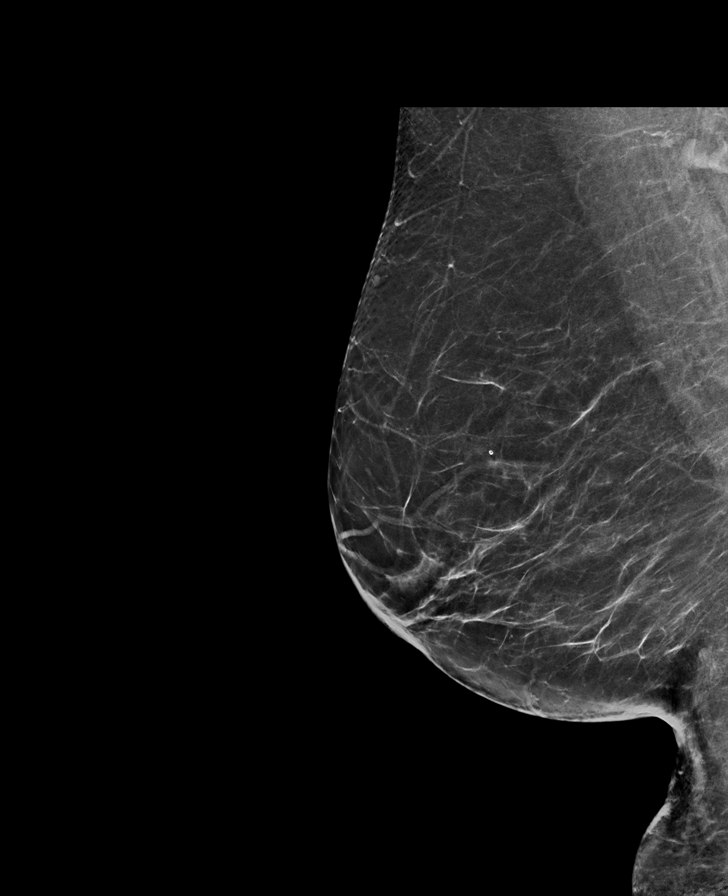

[L CC synth-2D]
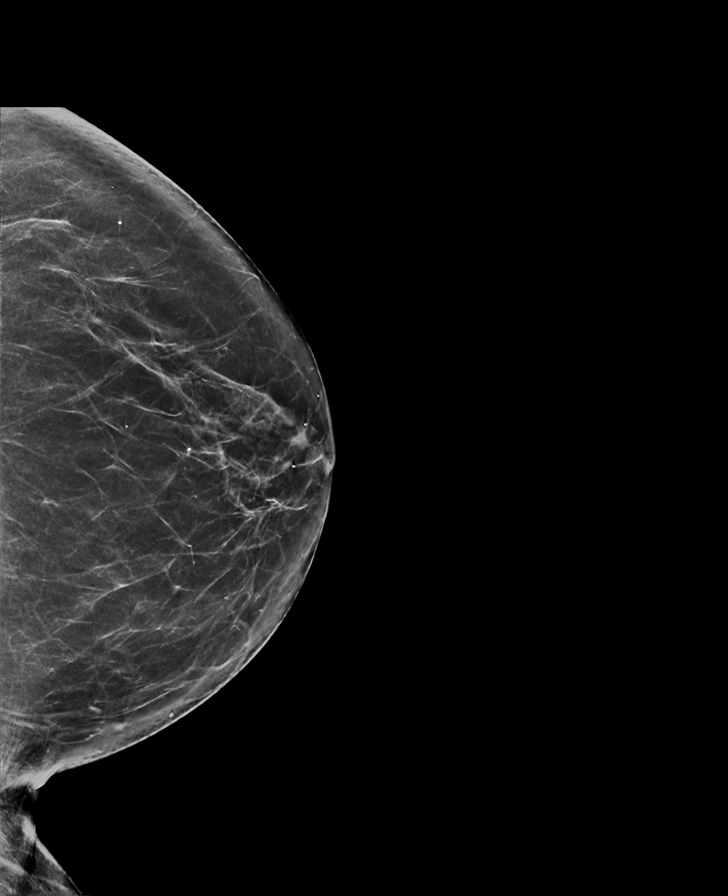

[L CC]
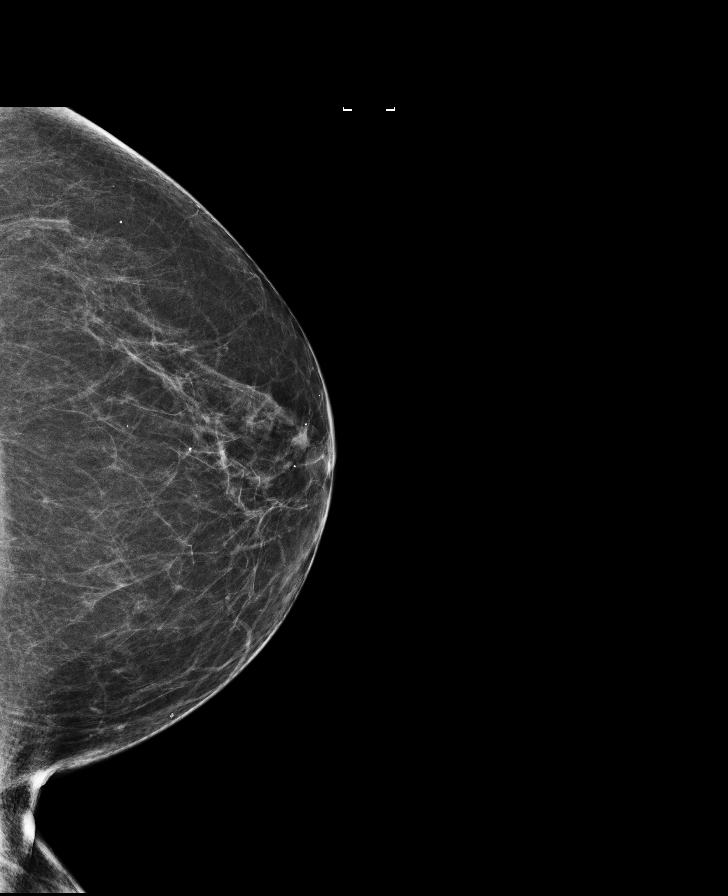

[8 of 28 positions shown; findings below may reference images not displayed]

ACR Breast Density Category b: There are scattered areas of
fibroglandular density.
FINDINGS: There are no findings suspicious for malignancy. Images were
processed with CAD.
IMPRESSION: No mammographic evidence of malignancy. A result letter of this
screening mammogram will be mailed directly to the patient.

RECOMMENDATION:
Screening mammogram in one year. (Code:97-6-RS4)

BI-RADS CATEGORY  1: Negative.

## 2017-10-04 ENCOUNTER — Telehealth: Payer: Self-pay | Admitting: Internal Medicine

## 2017-10-04 NOTE — Telephone Encounter (Signed)
Insurance verification for Prolia filed on [employer] Portal. 

## 2017-10-08 ENCOUNTER — Other Ambulatory Visit: Payer: Self-pay | Admitting: Internal Medicine

## 2017-10-08 NOTE — Telephone Encounter (Signed)
Refilled: 03/20/2017 Last OV: 03/20/2017 Next OV: not scheduled

## 2017-10-24 ENCOUNTER — Telehealth: Payer: Self-pay

## 2017-10-24 NOTE — Telephone Encounter (Signed)
Copied from CRM [id number]. Topic: Appointment Scheduling - Scheduling Inquiry for Clinic >> Oct 24, 2017  2:35 PM Arlyss Gandy, NT wrote: Reason for CRM: Pt wanting to schedule an appt for a Prolia injection. Please call when insurance verified as well as shot available. She would like to have her flu shot the same day. She also wants to set up her AWV and wants to see if all this can be done on the same day in the morning.

## 2017-10-28 ENCOUNTER — Other Ambulatory Visit: Payer: Self-pay | Admitting: Internal Medicine

## 2017-10-28 NOTE — Telephone Encounter (Signed)
Her Prolia nd flu shot has been scheduled, Can you help with AWV scheduling ?

## 2017-10-29 ENCOUNTER — Telehealth: Payer: Self-pay

## 2017-10-29 MED ORDER — ZOLPIDEM TARTRATE 10 MG PO TABS: 10.0000 mg | ORAL_TABLET | Freq: Every day | ORAL | 5 refills | Status: DC

## 2017-10-29 NOTE — Telephone Encounter (Signed)
Refilled: 04/24/2017 Last OV: 03/20/2017 Next OV: not scheduled

## 2017-10-30 ENCOUNTER — Telehealth: Payer: Self-pay

## 2017-10-30 ENCOUNTER — Ambulatory Visit (INDEPENDENT_AMBULATORY_CARE_PROVIDER_SITE_OTHER): Payer: Medicare Other | Admitting: *Deleted

## 2017-10-30 DIAGNOSIS — M810 Age-related osteoporosis without current pathological fracture: Secondary | ICD-10-CM

## 2017-10-30 DIAGNOSIS — Z23 Encounter for immunization: Secondary | ICD-10-CM

## 2017-10-30 MED ORDER — DENOSUMAB 60 MG/ML ~~LOC~~ SOSY
60.0000 mg | PREFILLED_SYRINGE | Freq: Once | SUBCUTANEOUS | Status: AC
  Administered 2017-10-30: 60 mg via SUBCUTANEOUS

## 2017-10-30 NOTE — Telephone Encounter (Signed)
Ok. Pt is scheduled for AWV.

## 2017-10-30 NOTE — Progress Notes (Signed)
Patient presented for Prolia injection to Left arm [place], patient voiced no concerns or complaints during or after injection. 

## 2017-10-30 NOTE — Telephone Encounter (Signed)
PA for Zolpidem has been submitted on covermymeds.  

## 2017-10-31 ENCOUNTER — Ambulatory Visit: Payer: Medicare Other

## 2017-10-31 DIAGNOSIS — G4700 Insomnia, unspecified: Secondary | ICD-10-CM

## 2017-10-31 NOTE — Telephone Encounter (Signed)
PA has been approved through 01/28/2018.

## 2017-11-08 ENCOUNTER — Ambulatory Visit (INDEPENDENT_AMBULATORY_CARE_PROVIDER_SITE_OTHER): Payer: Medicare Other

## 2017-11-08 VITALS — BP 130/70 | HR 73 | Temp 98.4°F | Resp 15 | Ht 64.0 in | Wt 151.0 lb

## 2017-11-08 DIAGNOSIS — Z0000 Encounter for general adult medical examination without abnormal findings: Secondary | ICD-10-CM

## 2017-11-08 NOTE — Patient Instructions (Addendum)
  Ms. Coonrod , Thank you for taking time to come for your Medicare Wellness Visit. I appreciate your ongoing commitment to your health goals. Please review the following plan we discussed and let me know if I can assist you in the future.   Follow up as needed.    Bring a copy of your Health Care Power of Attorney and/or Living Will to be scanned into chart.  Have a great day!  These are the goals we discussed: Goals      Patient Stated   . DIET - REDUCE SUGAR INTAKE (pt-stated)       This is a list of the screening recommended for you and due dates:  Health Maintenance  Topic Date Due  . Tetanus Vaccine  02/20/2023  . Flu Shot  Completed  . DEXA scan (bone density measurement)  Completed  . Pneumonia vaccines  Completed

## 2017-11-08 NOTE — Progress Notes (Signed)
Subjective:   Laura [name] is a 84 y.o. female who presents for Medicare Annual (Subsequent) preventive examination.  Review of Systems:  No ROS.  Medicare Wellness Visit. Additional risk factors are reflected in the social history. Cardiac Risk Factors include: advanced age (>3mn, >>35women);hypertension     Objective:     Vitals: BP 130/70 (BP Location: Left Arm, Patient Position: Sitting, Cuff Size: Normal)   Pulse 73   Temp 98.4 F (36.9 C) (Oral)   Resp 15   Ht _0  (1.626 m)   Wt 151 lb (68.5 kg)   SpO2 98%   BMI 25.92 kg/m   Body mass index is 25.92 kg/m.  Advanced Directives 11/08/2017 06/05/2016 11/01/2014 10/10/2014  Does Patient Have a Medical Advance Directive? Yes Yes Yes No  Type of AParamedicof ACambriaLiving will [place] will Living will;Healthcare Power of Attorney -  Does patient want to make changes to medical advance directive? No - Patient declined No - Patient declined No - Patient declined -  Copy of [place] Chart? No - copy requested No - copy requested - -  Would patient like information on creating a medical advance directive? - - - Yes - Educational materials given    Tobacco Social History   Tobacco Use  Smoking Status Former Smoker  . Last attempt to quit: 03/20/1981  . Years since quitting: 36.6  Smokeless Tobacco Never Used     Counseling given: Not Answered   Clinical Intake:  Pre-visit preparation completed: Yes  Pain : No/denies pain     Nutritional Status: BMI 25 -29 Overweight Diabetes: No  How often do you need to have someone help you when you read instructions, pamphlets, or other written materials from your doctor or pharmacy?: 1 - Never  Interpreter Needed?: No     Past Medical History:  Diagnosis Date  . Carotid stenosis    Left   . High cholesterol   . Hypertension    Pt states that her blood pressure is very well controlled    . Migraine with aura   . Osteoporosis    Past Surgical History:  Procedure Laterality Date  . ABDOMINAL HYSTERECTOMY     secondary to peritonitis and complications   . APPENDECTOMY    . TONSILLECTOMY    . TUBAL LIGATION     at age 84 vaginal complicated by peritonitis   Family History  Adopted: Yes  Problem Relation Age of Onset  . Heart disease Mother   . Breast cancer Mother 788 . Cancer Mother        Breast   . Breast cancer Sister 625 . Cancer Daughter        Breast    Social History   Socioeconomic History  . Marital status: Widowed    Spouse name: Not on file  . Number of children: 2  . Years of education: 152 . Highest education level: Not on file  Occupational History  . Occupation: Retired  SScientific laboratory technician . Financial resource strain: Not on file  . Food insecurity:    Worry: Not on file    Inability: Not on file  . Transportation needs:    Medical: Not on file    Non-medical: Not on file  Tobacco Use  . Smoking status: Former Smoker    Last attempt to quit: 03/20/1981    Years since quitting: 36.6  . Smokeless tobacco: Never  Used  Substance and Sexual Activity  . Alcohol use: Yes    Alcohol/week: 14.0 standard drinks    Types: 14 Glasses of wine per week    Comment: 2 glasses of wine per day.  . Drug use: No  . Sexual activity: Never  Lifestyle  . Physical activity:    Days per week: Not on file    Minutes per session: Not on file  . Stress: Not on file  Relationships  . Social connections:    Talks on phone: Not on file    Gets together: Not on file    Attends religious service: Not on file    Active member of club or organization: Not on file    Attends meetings of clubs or organizations: Not on file    Relationship status: Not on file  Other Topics Concern  . Not on file  Social History Narrative   Lives at home alone.   Left-handed.   2-4 cups caffeine per day.    Outpatient Encounter Medications as of 11/08/2017  Medication Sig   . ALPRAZolam (XANAX) 0.5 MG tablet TAKE 1 TABLET BY MOUTH AT BEDTIME AS NEEDED FOR SLEEP  . calcium carbonate (OS-CAL) 600 MG TABS Take 600 mg by mouth 2 (two) times daily with a meal.  . Cholecalciferol (VITAMIN D3) 1000 UNITS CAPS Take one by mouth twice a day.  . Coenzyme Q10 (CO Q 10) 100 MG CAPS Take 1 capsule by mouth daily.  . Cyanocobalamin 2500 MCG SUBL PLACE 1 TABLET UNDER THE TONGUE DAILY AS DIRECTED  . denosumab (PROLIA) 60 MG/ML SOLN injection Inject 60 mg into the skin every 6 (six) months. Administer in upper arm, thigh, or abdomen  . Lutein 20 MG TABS Take one by mouth daily  . magnesium oxide (MAG-OX) 400 MG tablet Take 400 mg by mouth 2 (two) times daily.  . nitroGLYCERIN (NITROSTAT) 0.4 MG SL tablet Place 1 tablet (0.4 mg total) under the tongue every 5 (five) minutes as needed for chest pain.  . pravastatin (PRAVACHOL) 40 MG tablet TAKE 1 TABLET BY MOUTH ONCE DAILY  . Riboflavin 100 MG CAPS Take 1 capsule by mouth 2 (two) times daily.   Marland Kitchen triamterene-hydrochlorothiazide (MAXZIDE-25) 37.5-25 MG tablet TAKE 1 TABLET BY MOUTH DAILY  . zolpidem (AMBIEN) 10 MG tablet Take 1 tablet (10 mg total) by mouth at bedtime.  . [DISCONTINUED] amitriptyline (ELAVIL) 25 MG tablet Take 6 tablets (150 mg total) by mouth at bedtime.  . [DISCONTINUED] aspirin EC 81 MG tablet Take 81 mg by mouth daily.  . [DISCONTINUED] chlorhexidine gluconate, MEDLINE KIT, (PERIDEX) 0.12 % solution Use as directed 15 mLs in the mouth or throat 2 (two) times daily.  . [DISCONTINUED] fluticasone (FLONASE) 50 MCG/ACT nasal spray Place 2 sprays into both nostrils daily.  . [DISCONTINUED] triamterene-hydrochlorothiazide (MAXZIDE-25) 37.5-25 MG tablet Take 1 tablet by mouth daily.   Facility-Administered Encounter Medications as of 11/08/2017  Medication  . denosumab (PROLIA) injection 60 mg    Activities of Daily Living In your present state of health, do you have any difficulty performing the following  activities: 11/08/2017  Hearing? Y  Vision? N  Difficulty concentrating or making decisions? N  Walking or climbing stairs? N  Dressing or bathing? N  Doing errands, shopping? N  Preparing Food and eating ? N  Using the Toilet? N  In the past six months, have you accidently leaked urine? N  Do you have problems with loss of bowel control? N  Managing your Medications? N  Managing your Finances? N  Housekeeping or managing your Housekeeping? N  Some recent data might be hidden    Patient Care Team: Crecencio Mc, MD as PCP - General (Internal Medicine) Crecencio Mc, MD (Internal Medicine)    Assessment:   This is a routine wellness examination for Mayetta.The goal of the wellness visit is to assist the patient how to close the gaps in care and create a preventative care plan for the patient.   The roster of all physicians providing medical care to patient is listed in the Snapshot section of the chart.  Taking calcium VIT D as appropriate/Osteoporosis  reviewed.  Prolia injections currently administered every 6 months.  Safety issues reviewed; Smoke and carbon monoxide detectors in the home. No firearms in the home. Wears seatbelts when driving or riding with others. No violence in the home.  They do not have excessive sun exposure.  Discussed the need for sun protection: hats, long sleeves and the use of sunscreen if there is significant sun exposure.  No new identified risk were noted.  No failures at ADL's or IADL's.    BMI- discussed the importance of a healthy diet, water intake and the benefits of aerobic exercise. She has a healthy diet, adequate water intake and exercises 5 days a week with cardio dance, zumba, elliptical and walking.    Dental- every 6 months.  Eye- Visual acuity not assessed per patient preference since they have regular follow up with the ophthalmologist.  Wears corrective lenses.  Sleep patterns- Sleeps well through the night.  Taking  ambien as directed. Wakes feeling rested.   Health maintenance gaps- closed.  Patient Concerns: None at this time. Follow up with PCP as needed.  Exercise Activities and Dietary recommendations Current Exercise Habits: Home exercise routine, Type of exercise: walking;treadmill;calisthenics, Time (Minutes): 60, Frequency (Times/Week): 5, Weekly Exercise (Minutes/Week): 300, Intensity: Moderate  Goals      Patient Stated   . DIET - REDUCE SUGAR INTAKE (pt-stated)       Fall Risk Fall Risk  11/08/2017 07/30/2016 06/05/2016 12/01/2015 10/27/2014  Falls in the past year? No No No No Yes  Number falls in past yr: - - - - 1  Comment - - - - -  Injury with Fall? - - - - No  Risk for fall due to : - - - - Other (Comment)  Risk for fall due to: Comment - - - - fell over a chair backing up  Follow up - - - - Education provided   Depression Screen PHQ 2/9 Scores 11/08/2017 07/30/2016 06/05/2016 12/01/2015  PHQ - 2 Score 0 0 0 0  PHQ- 9 Score - - 0 -     Cognitive Function MMSE - Mini Mental State Exam 11/08/2017 06/05/2016  Orientation to time 5 5  Orientation to Place 5 5  Registration 3 3  Attention/ Calculation 5 5  Recall 3 3  Language- name 2 objects 2 2  Language- repeat 1 1  Language- follow 3 step command 3 3  Language- read & follow direction 1 1  Write a sentence 1 1  Copy design 1 1  Total score 30 30        Immunization History  Administered Date(s) Administered  . Influenza, High Dose Seasonal PF 10/27/2014, 10/05/2015, 10/24/2016, 10/30/2017  . Influenza,inj,Quad PF,6+ Mos 10/17/2012, 10/30/2013  . Pneumococcal Conjugate-13 10/30/2013  . Pneumococcal Polysaccharide-23 12/30/2010  . Tdap 02/19/2013  . Zoster 01/01/2008  .  Zoster Recombinat (Shingrix) 05/14/2017, 07/31/2017   Screening Tests Health Maintenance  Topic Date Due  . TETANUS/TDAP  02/20/2023  . INFLUENZA VACCINE  Completed  . DEXA SCAN  Completed  . PNA vac Low Risk Adult  Completed      Plan:     End of life planning; Advance aging; Advanced directives discussed. Copy of current HCPOA/Living Will requested.    I have personally reviewed and noted the following in the patient's chart:   . Medical and social history . Use of alcohol, tobacco or illicit drugs  . Current medications and supplements . Functional ability and status . Nutritional status . Physical activity . Advanced directives . List of other physicians . Hospitalizations, surgeries, and ER visits in previous 12 months . Vitals . Screenings to include cognitive, depression, and falls . Referrals and appointments  In addition, I have reviewed and discussed with patient certain preventive protocols, quality metrics, and best practice recommendations. A written personalized care plan for preventive services as well as general preventive health recommendations were provided to patient.     OBrien-Blaney, Tyrrell Stephens L, LPN  02/40/9735   I have reviewed the above information and agree with above.   Deborra Medina, MD

## 2017-12-31 ENCOUNTER — Other Ambulatory Visit: Payer: Self-pay | Admitting: Internal Medicine

## 2018-03-28 ENCOUNTER — Other Ambulatory Visit: Payer: Self-pay | Admitting: Internal Medicine

## 2018-04-29 ENCOUNTER — Other Ambulatory Visit: Payer: Self-pay | Admitting: Internal Medicine

## 2018-04-29 NOTE — Telephone Encounter (Signed)
Refilled: 10/29/2017 Last OV: 03/20/2017 Next OV: not scheduled

## 2018-05-05 ENCOUNTER — Encounter: Payer: Self-pay | Admitting: *Deleted

## 2018-05-05 DIAGNOSIS — E785 Hyperlipidemia, unspecified: Secondary | ICD-10-CM

## 2018-05-05 DIAGNOSIS — I10 Essential (primary) hypertension: Secondary | ICD-10-CM

## 2018-05-05 DIAGNOSIS — E538 Deficiency of other specified B group vitamins: Secondary | ICD-10-CM

## 2018-05-05 DIAGNOSIS — R5383 Other fatigue: Secondary | ICD-10-CM

## 2018-05-05 DIAGNOSIS — Z0000 Encounter for general adult medical examination without abnormal findings: Secondary | ICD-10-CM

## 2018-05-05 DIAGNOSIS — E876 Hypokalemia: Secondary | ICD-10-CM

## 2018-05-05 DIAGNOSIS — E559 Vitamin D deficiency, unspecified: Secondary | ICD-10-CM

## 2018-05-15 ENCOUNTER — Telehealth: Payer: Self-pay | Admitting: Internal Medicine

## 2018-05-15 DIAGNOSIS — E559 Vitamin D deficiency, unspecified: Secondary | ICD-10-CM

## 2018-05-15 DIAGNOSIS — R5383 Other fatigue: Secondary | ICD-10-CM

## 2018-05-15 DIAGNOSIS — E876 Hypokalemia: Secondary | ICD-10-CM

## 2018-05-15 DIAGNOSIS — E538 Deficiency of other specified B group vitamins: Secondary | ICD-10-CM

## 2018-05-15 DIAGNOSIS — I10 Essential (primary) hypertension: Secondary | ICD-10-CM

## 2018-05-15 DIAGNOSIS — E785 Hyperlipidemia, unspecified: Secondary | ICD-10-CM

## 2018-05-15 DIAGNOSIS — Z0000 Encounter for general adult medical examination without abnormal findings: Secondary | ICD-10-CM

## 2018-05-15 NOTE — Telephone Encounter (Signed)
Insurance verification for Prolia filed on [employer] Portal. 

## 2018-05-16 NOTE — Telephone Encounter (Signed)
Yes thank you  .  I added vitamin d

## 2018-05-16 NOTE — Telephone Encounter (Signed)
Patient coming in for Prolia injection on 05/22/18 no labs since 07/2016 I have put in future labs, CMET, CBC, Lipid, TSH , B12, magnesium , per last labs patient had collected?

## 2018-05-16 NOTE — Addendum Note (Signed)
Addended by: Sherlene Shams on: 05/16/2018 01:25 PM   Modules accepted: Orders

## 2018-05-16 NOTE — Addendum Note (Signed)
Addended by: Dennie Bible on: 05/16/2018 12:07 PM   Modules accepted: Orders

## 2018-05-22 ENCOUNTER — Ambulatory Visit: Payer: Medicare Other

## 2018-05-22 ENCOUNTER — Other Ambulatory Visit: Payer: Medicare Other

## 2018-05-28 ENCOUNTER — Other Ambulatory Visit: Payer: Self-pay

## 2018-05-28 ENCOUNTER — Other Ambulatory Visit (INDEPENDENT_AMBULATORY_CARE_PROVIDER_SITE_OTHER): Payer: Medicare Other

## 2018-05-28 ENCOUNTER — Ambulatory Visit (INDEPENDENT_AMBULATORY_CARE_PROVIDER_SITE_OTHER): Payer: Medicare Other

## 2018-05-28 ENCOUNTER — Ambulatory Visit (INDEPENDENT_AMBULATORY_CARE_PROVIDER_SITE_OTHER): Payer: Medicare Other | Admitting: Internal Medicine

## 2018-05-28 DIAGNOSIS — E559 Vitamin D deficiency, unspecified: Secondary | ICD-10-CM

## 2018-05-28 DIAGNOSIS — M7062 Trochanteric bursitis, left hip: Secondary | ICD-10-CM

## 2018-05-28 DIAGNOSIS — I10 Essential (primary) hypertension: Secondary | ICD-10-CM | POA: Diagnosis not present

## 2018-05-28 DIAGNOSIS — M810 Age-related osteoporosis without current pathological fracture: Secondary | ICD-10-CM

## 2018-05-28 DIAGNOSIS — G4700 Insomnia, unspecified: Secondary | ICD-10-CM

## 2018-05-28 DIAGNOSIS — E785 Hyperlipidemia, unspecified: Secondary | ICD-10-CM

## 2018-05-28 MED ORDER — DENOSUMAB 60 MG/ML ~~LOC~~ SOSY
60.0000 mg | PREFILLED_SYRINGE | Freq: Once | SUBCUTANEOUS | Status: AC
  Administered 2018-05-28: 60 mg via SUBCUTANEOUS

## 2018-05-28 MED ORDER — PREDNISONE 10 MG PO TABS: ORAL_TABLET | ORAL | 0 refills | Status: DC

## 2018-05-28 MED ORDER — ZOLPIDEM TARTRATE 10 MG PO TABS: ORAL_TABLET | ORAL | 5 refills | Status: DC

## 2018-05-28 NOTE — Progress Notes (Signed)
Virtual Visit via [email or web address]  This visit type was conducted due to national recommendations for restrictions regarding the COVID-19 pandemic (e.g. social distancing).  This format is felt to be most appropriate for this patient at this time.  All issues noted in this document were discussed and addressed.  No physical exam was performed (except for noted visual exam findings with Video Visits).   I connected with@ on 05/28/18 at 11:00 AM EDT by a video enabled telemedicine application or telephone and verified that I am speaking with the correct person using two identifiers. Location patient: home Location provider: work  Persons participating in the virtual visit: patient, provider  I discussed the limitations, risks, security and privacy concerns of performing an evaluation and management service by telephone and the availability of in person appointments. I also discussed with the patient that there may be a patient responsible charge related to this service. The patient expressed understanding and agreed to proceed.  I Reason for visit: hip pain,  follow up on hypertension , hyperlipidemia, osteoporosis and chronic insomnia .  Last seen over one year ago.  A total of 40 minutes was spent with patient more than half of which was spent in counseling patient on the above mentioned issues , reviewing previous labs and imaging studies done, and coordination of care.  HPI: Laura [name] is a healthy active 85 yr old female who presents wth recent onset of left hip pain that  began after "overdoing it" while participating in a seated yoga class via Zoom.  The onset occurred 2-3 weeks ago. t localizes to the lateral side of hip and does not radiate.  She recalls that the pain occurred during the "  Figure 4" move when she tried  to force her left  knee down to a position parallel with the floor (she was seated, with left leg crossed at the knee).  She has continued to walk and participate in yoga.   Walking  aggravates the pain .    Hypertension: patient checks blood pressure twice weekly at home.  Readings have been for the most part < 140/80 at rest . Patient is following a reduce salt diet most days and is taking medications as prescribed.  Insomnia:  Patient  has chronic insomnia managed with ambien. .  Without medication she Wakes up 2 to 3 times per night . Bedtime hygiene reviewed,  Patient has been using an electronic book to read before bed.  Does not drink caffeinated beverages after 3 PM.  Only voids  bladder once per night.  No snoring partner. Does not drink alcohol to excess.  Not exercising excessively in the evening. Patient does have a history of anxiety but does not lie awake worrying about issues that cannot be resolved. Does not take stimulants. .   Osteoporosis :  Receiving prolia for osteoporosis . Most recent injection was today   ROS: See pertinent positives and negatives per HPI.  Past Medical History:  Diagnosis Date  . Accidental fall 10/16/2013  . Carotid stenosis    Left   . High cholesterol   . Hypertension    Pt states that her blood pressure is very well controlled  . Migraine with aura   . Osteoporosis     Past Surgical History:  Procedure Laterality Date  . ABDOMINAL HYSTERECTOMY     secondary to peritonitis and complications   . APPENDECTOMY    . TONSILLECTOMY    . TUBAL LIGATION     at age  47, vaginal complicated by peritonitis    Family History  Adopted: Yes  Problem Relation Age of Onset  . Heart disease Mother   . Breast cancer Mother 93  . Cancer Mother        Breast   . Breast cancer Sister 34  . Cancer Daughter        Breast     SOCIAL HX: widowed, independent   Current Outpatient Medications:  .  ALPRAZolam (XANAX) 0.5 MG tablet, TAKE 1 TABLET BY MOUTH AT BEDTIME AS NEEDED FOR SLEEP, Disp: 30 tablet, Rfl: 0 .  calcium carbonate (OS-CAL) 600 MG TABS, Take 600 mg by mouth 2 (two) times daily with a meal., Disp: , Rfl:  .   Cholecalciferol (VITAMIN D3) 1000 UNITS CAPS, Take one by mouth twice a day., Disp: , Rfl:  .  Coenzyme Q10 (CO Q 10) 100 MG CAPS, Take 1 capsule by mouth daily., Disp: , Rfl:  .  Cyanocobalamin 2500 MCG SUBL, PLACE 1 TABLET UNDER THE TONGUE DAILY AS DIRECTED, Disp: 90 tablet, Rfl: 1 .  denosumab (PROLIA) 60 MG/ML SOLN injection, Inject 60 mg into the skin every 6 (six) months. Administer in upper arm, thigh, or abdomen, Disp: , Rfl:  .  Lutein 20 MG TABS, Take one by mouth daily, Disp: , Rfl:  .  magnesium oxide (MAG-OX) 400 MG tablet, Take 400 mg by mouth 2 (two) times daily., Disp: , Rfl:  .  nitroGLYCERIN (NITROSTAT) 0.4 MG SL tablet, Place 1 tablet (0.4 mg total) under the tongue every 5 (five) minutes as needed for chest pain., Disp: 50 tablet, Rfl: 3 .  pravastatin (PRAVACHOL) 40 MG tablet, TAKE 1 TABLET BY MOUTH ONCE DAILY, Disp: 90 tablet, Rfl: 0 .  predniSONE (DELTASONE) 10 MG tablet, 6 tablets on Day 1 , then reduce by 1 tablet daily until gone, Disp: 21 tablet, Rfl: 0 .  Riboflavin 100 MG CAPS, Take 1 capsule by mouth 2 (two) times daily. , Disp: , Rfl:  .  triamterene-hydrochlorothiazide (MAXZIDE-25) 37.5-25 MG tablet, TAKE 1 TABLET BY MOUTH DAILY, Disp: 90 tablet, Rfl: 1 .  zolpidem (AMBIEN) 10 MG tablet, TAKE 1 TABLET(10 MG) BY MOUTH AT BEDTIME, Disp: 30 tablet, Rfl: 5  Current Facility-Administered Medications:  .  denosumab (PROLIA) injection 60 mg, 60 mg, Subcutaneous, Once, Darrick Huntsman, Mar Daring, MD  EXAM:  VITALS per patient if applicable:  GENERAL: alert, oriented, appears well and in no acute distress  HEENT: atraumatic, conjunttiva clear, no obvious abnormalities on inspection of external nose and ears  NECK: normal movements of the head and neck  LUNGS: on inspection no signs of respiratory distress, breathing rate appears normal, no obvious gross SOB, gasping or wheezing  CV: no obvious cyanosis  Laura: moves all visible extremities without noticeable  abnormality  PSYCH/NEURO: pleasant and cooperative, no obvious depression or anxiety, speech and thought processing grossly intact  ASSESSMENT AND PLAN:  Discussed the following assessment and plan:  Age-related osteoporosis without current pathological fracture - Plan: DG Bone Density  Trochanteric bursitis of left hip  Insomnia, unspecified type  Essential hypertension  Hyperlipidemia with target LDL less than 130  HTN (hypertension) Well controlled on generic Maxzide .  Renal function stable, no changes today.  Lab Results  Component Value Date   CREATININE 0.78 03/20/2017   Lab Results  Component Value Date   NA 139 03/20/2017   K 4.2 03/20/2017   CL 103 03/20/2017   CO2 31 03/20/2017  Osteoporosis Diagnosed in 2015 with T score of -2.8 in spine..  Managed with biannual injections of Prolia, which was started  In March 2017.  Her  Last DEXA was done Jan 2018 and should be repeated this year   Bursitis of left hip Trial of prednisone taper and ice .  If no improvement will refer to sports medicine for formal evaluation and treatment  Insomnia I have reviewed the dangers of prescribing this drug to the elderly with patient.  She has been [place] dependent for over two years, and prior attempts to use an alternative medication  have been unsuccessful resulting in loss of sleep..She is willing to accept the risks of adverse events. Refilled today   Hyperlipidemia with target LDL less than 130 Managed with pravastatin and asa,  lfts are OVERDUE and have been ordered .    Lab Results  Component Value Date   ALT 14 03/20/2017   AST 16 03/20/2017   ALKPHOS 42 03/20/2017   BILITOT 0.3 03/20/2017       I discussed the assessment and treatment plan with the patient. The patient was provided an opportunity to ask questions and all were answered. The patient agreed with the plan and demonstrated an understanding of the instructions.   The patient was advised to call  back or seek an in-person evaluation if the symptoms worsen or if the condition fails to improve as anticipated.  I provided40  minutes of non-face-to-face time during this encounter.   Sherlene Shams, MD

## 2018-05-28 NOTE — Patient Instructions (Signed)
I think you are dealing with trochanteric bursitis .  We discussed treating it with a steroid taper for one week and stopping the advil during this period   You can add up to 2000 mg of acetominophen (tylenol) every day safely  In divided doses (500 mg every 6 hours  Or 1000 mg every 12 hours.)  Reduce your daily exercise to 15 minutes of walking,  NO YOGA.    If not significantly better by Sunday night,  We'll proceed with PT vs Sports Medicine referral  Your Remus Loffler has been refilled for 6 months

## 2018-05-28 NOTE — Progress Notes (Signed)
Laura [name] presents today for injection per MD orders. Prolia injection administered SQ in right Upper Arm. Administration without incident. Patient tolerated well.  Monteen Toops,cma

## 2018-05-30 ENCOUNTER — Encounter: Payer: Self-pay | Admitting: Internal Medicine

## 2018-05-30 NOTE — Assessment & Plan Note (Addendum)
Well controlled on generic Maxzide .  Renal function stable, no changes today.  Lab Results  Component Value Date   CREATININE 0.78 03/20/2017   Lab Results  Component Value Date   NA 139 03/20/2017   K 4.2 03/20/2017   CL 103 03/20/2017   CO2 31 03/20/2017

## 2018-05-31 ENCOUNTER — Encounter: Payer: Self-pay | Admitting: Internal Medicine

## 2018-05-31 DIAGNOSIS — M7072 Other bursitis of hip, left hip: Secondary | ICD-10-CM | POA: Insufficient documentation

## 2018-05-31 NOTE — Assessment & Plan Note (Signed)
I have reviewed the dangers of prescribing this drug to the elderly with patient.  She has been [place] dependent for over two years, and prior attempts to use an alternative medication  have been unsuccessful resulting in loss of sleep..She is willing to accept the risks of adverse events. Refilled today

## 2018-05-31 NOTE — Assessment & Plan Note (Signed)
Managed with pravastatin and asa,  lfts are OVERDUE and have been ordered .    Lab Results  Component Value Date   ALT 14 03/20/2017   AST 16 03/20/2017   ALKPHOS 42 03/20/2017   BILITOT 0.3 03/20/2017

## 2018-05-31 NOTE — Assessment & Plan Note (Signed)
Trial of prednisone taper and ice .  If no improvement will refer to sports medicine for formal evaluation and treatment

## 2018-05-31 NOTE — Assessment & Plan Note (Addendum)
Diagnosed in 2015 with T score of -2.8 in spine..  Managed with biannual injections of Prolia, which was started  In March 2017.  Her  Last DEXA was done Jan 2018 and should be repeated this year

## 2018-06-03 ENCOUNTER — Other Ambulatory Visit: Payer: Medicare Other

## 2018-06-17 ENCOUNTER — Encounter: Payer: Self-pay | Admitting: Internal Medicine

## 2018-07-02 ENCOUNTER — Other Ambulatory Visit: Payer: Self-pay

## 2018-07-02 ENCOUNTER — Other Ambulatory Visit (INDEPENDENT_AMBULATORY_CARE_PROVIDER_SITE_OTHER): Payer: Medicare Other

## 2018-07-02 DIAGNOSIS — Z0000 Encounter for general adult medical examination without abnormal findings: Secondary | ICD-10-CM | POA: Diagnosis not present

## 2018-07-02 DIAGNOSIS — E876 Hypokalemia: Secondary | ICD-10-CM

## 2018-07-02 DIAGNOSIS — E785 Hyperlipidemia, unspecified: Secondary | ICD-10-CM

## 2018-07-02 DIAGNOSIS — I10 Essential (primary) hypertension: Secondary | ICD-10-CM | POA: Diagnosis not present

## 2018-07-02 DIAGNOSIS — E559 Vitamin D deficiency, unspecified: Secondary | ICD-10-CM

## 2018-07-02 DIAGNOSIS — E538 Deficiency of other specified B group vitamins: Secondary | ICD-10-CM | POA: Diagnosis not present

## 2018-07-02 DIAGNOSIS — R5383 Other fatigue: Secondary | ICD-10-CM | POA: Diagnosis not present

## 2018-07-02 LAB — COMPREHENSIVE METABOLIC PANEL
ALT: 12 U/L (ref 0–35)
AST: 15 U/L (ref 0–37)
Albumin: 4.3 g/dL (ref 3.5–5.2)
Alkaline Phosphatase: 46 U/L (ref 39–117)
BUN: 13 mg/dL (ref 6–23)
CO2: 28 mEq/L (ref 19–32)
Calcium: 9.4 mg/dL (ref 8.4–10.5)
Chloride: 100 mEq/L (ref 96–112)
Creatinine, Ser: 0.75 mg/dL (ref 0.40–1.20)
GFR: 73.68 mL/min (ref >60.00)
Glucose, Bld: 99 mg/dL (ref 70–99)
Potassium: 4.3 mEq/L (ref 3.5–5.1)
Sodium: 136 mEq/L (ref 135–145)
Total Bilirubin: 0.5 mg/dL (ref 0.2–1.2)
Total Protein: 6.6 g/dL (ref 6.0–8.3)

## 2018-07-02 LAB — CBC WITH DIFFERENTIAL/PLATELET
Basophils Absolute: 0 10*3/uL (ref 0.0–0.1)
Basophils Relative: 0.6 % (ref 0.0–3.0)
Eosinophils Absolute: 0.2 10*3/uL (ref 0.0–0.7)
Eosinophils Relative: 3.7 % (ref 0.0–5.0)
HCT: 40.9 % (ref 36.0–46.0)
Hemoglobin: 14.1 g/dL (ref 12.0–15.0)
Lymphocytes Relative: 34.3 % (ref 12.0–46.0)
Lymphs Abs: 2.1 10*3/uL (ref 0.7–4.0)
MCHC: 34.4 g/dL (ref 30.0–36.0)
MCV: 89.4 fl (ref 78.0–100.0)
Monocytes Absolute: 0.4 10*3/uL (ref 0.1–1.0)
Monocytes Relative: 6.9 % (ref 3.0–12.0)
Neutro Abs: 3.3 10*3/uL (ref 1.4–7.7)
Neutrophils Relative %: 54.5 % (ref 43.0–77.0)
Platelets: 270 10*3/uL (ref 150.0–400.0)
RBC: 4.58 Mil/uL (ref 3.87–5.11)
RDW: 13.6 % (ref 11.5–15.5)
WBC: 6.1 10*3/uL (ref 4.0–10.5)

## 2018-07-02 LAB — MAGNESIUM: Magnesium: 2.1 mg/dL (ref 1.5–2.5)

## 2018-07-02 LAB — LIPID PANEL
Cholesterol: 212 mg/dL — ABNORMAL HIGH (ref 0–200)
HDL: 88.9 mg/dL (ref >39.00)
LDL Cholesterol: 104 mg/dL — ABNORMAL HIGH (ref 0–99)
NonHDL: 123.59
Total CHOL/HDL Ratio: 2
Triglycerides: 97 mg/dL (ref 0.0–149.0)
VLDL: 19.4 mg/dL (ref 0.0–40.0)

## 2018-07-02 LAB — VITAMIN B12: Vitamin B-12: 644 pg/mL (ref 211–911)

## 2018-07-02 LAB — VITAMIN D 25 HYDROXY (VIT D DEFICIENCY, FRACTURES): VITD: 38.32 ng/mL (ref 30.00–100.00)

## 2018-07-02 LAB — TSH: TSH: 3.05 u[IU]/mL (ref 0.35–4.50)

## 2018-07-02 MED ORDER — PRAVASTATIN SODIUM 40 MG PO TABS: 40.0000 mg | ORAL_TABLET | Freq: Every day | ORAL | 1 refills | Status: DC

## 2018-10-10 ENCOUNTER — Other Ambulatory Visit: Payer: Self-pay

## 2018-10-10 ENCOUNTER — Ambulatory Visit (INDEPENDENT_AMBULATORY_CARE_PROVIDER_SITE_OTHER): Payer: Medicare Other

## 2018-10-10 DIAGNOSIS — Z23 Encounter for immunization: Secondary | ICD-10-CM

## 2018-10-30 ENCOUNTER — Telehealth: Payer: Self-pay | Admitting: *Deleted

## 2018-10-30 NOTE — Telephone Encounter (Signed)
Patient verified prolia and has been scheduled.

## 2018-11-03 ENCOUNTER — Other Ambulatory Visit: Payer: Self-pay | Admitting: Internal Medicine

## 2018-11-27 ENCOUNTER — Ambulatory Visit (INDEPENDENT_AMBULATORY_CARE_PROVIDER_SITE_OTHER): Payer: Medicare Other | Admitting: Internal Medicine

## 2018-11-27 ENCOUNTER — Encounter: Payer: Self-pay | Admitting: Internal Medicine

## 2018-11-27 ENCOUNTER — Other Ambulatory Visit: Payer: Self-pay

## 2018-11-27 VITALS — BP 130/76 | HR 72 | Temp 97.3°F | Resp 15 | Ht 64.0 in | Wt 151.4 lb

## 2018-11-27 DIAGNOSIS — M810 Age-related osteoporosis without current pathological fracture: Secondary | ICD-10-CM | POA: Diagnosis not present

## 2018-11-27 DIAGNOSIS — E538 Deficiency of other specified B group vitamins: Secondary | ICD-10-CM

## 2018-11-27 DIAGNOSIS — G5793 Unspecified mononeuropathy of bilateral lower limbs: Secondary | ICD-10-CM | POA: Diagnosis not present

## 2018-11-27 DIAGNOSIS — I10 Essential (primary) hypertension: Secondary | ICD-10-CM

## 2018-11-27 DIAGNOSIS — Z79899 Other long term (current) drug therapy: Secondary | ICD-10-CM | POA: Diagnosis not present

## 2018-11-27 DIAGNOSIS — E785 Hyperlipidemia, unspecified: Secondary | ICD-10-CM

## 2018-11-27 DIAGNOSIS — M1611 Unilateral primary osteoarthritis, right hip: Secondary | ICD-10-CM

## 2018-11-27 MED ORDER — DENOSUMAB 60 MG/ML ~~LOC~~ SOSY
60.0000 mg | PREFILLED_SYRINGE | Freq: Once | SUBCUTANEOUS | Status: AC
  Administered 2018-11-27: 60 mg via SUBCUTANEOUS

## 2018-11-27 NOTE — Patient Instructions (Signed)
Alpha lipoic acid may help your neuropathy.  Let me know if you want to try it  return for  fasting labs in December 2020

## 2018-11-27 NOTE — Progress Notes (Addendum)
Subjective:  Patient ID: Laura [name], female    DOB: April 10, 1932  Age: 85 y.o. MRN: [id number]  CC: The primary encounter diagnosis was Essential hypertension. Diagnoses of Long-term use of high-risk medication, Age-related osteoporosis without current pathological fracture, Hyperlipidemia with target LDL less than 130, Osteoarthritis of right hip, unspecified osteoarthritis type, and Neuropathy of both feet were also pertinent to this visit.  HPI Laura [name] presents for FOLLOW UP ON multiple issues   1) foot and leg pain secondary idiopathic neuropathy, not limited to evening.  Not interfering with activities,  Just annoying  Does not want to take medications.  Wondering if there are alternatives  2) Right lateral hip pain.  Chronic for over a year.  Aggravated by long walks and yoga.  Reviewed sacral films form several years ago  Joint space narrowing noted on right,  Normal on left.  Taking otc tylenol/motri prn. Discussed referral to ortho for steroid injection,  Declines at this time  3) HTN: Patient is taking her medications as prescribed and notes no adverse effects.  Home BP readings have been done about once per week and are  generally < 130/80 .  She is avoiding added salt in her diet and walking regularly about 3 times per week and doing yoga  for exercise  .  4) Chronic insomnia:  Patient  Continues to have occasional trouble sleeping.  Wakes up 2 to 3 times per night . Bedtime hygiene reviewed,  Patient has been using an electronic book to read before bed.  Does not drink caffeinated beverages after 3 PM.  Only voids  bladder once per night.  No snoring partner. Does not drink alcohol to excess.  Not exercising excessively in the evening. Patient does have a history of anxiety but does not lie awake worrying about issues that cannot be resolved. Does not take stimulants. .     Outpatient Medications Prior to Visit  Medication Sig Dispense Refill   ALPRAZolam (XANAX) 0.5  MG tablet TAKE 1 TABLET BY MOUTH AT BEDTIME AS NEEDED FOR SLEEP 30 tablet 0   calcium carbonate (OS-CAL) 600 MG TABS Take 600 mg by mouth 2 (two) times daily with a meal.     Cholecalciferol (VITAMIN D3) 1000 UNITS CAPS Take one by mouth twice a day.     Coenzyme Q10 (CO Q 10) 100 MG CAPS Take 1 capsule by mouth daily.     Cyanocobalamin 2500 MCG SUBL PLACE 1 TABLET UNDER THE TONGUE DAILY AS DIRECTED 90 tablet 1   denosumab (PROLIA) 60 MG/ML SOLN injection Inject 60 mg into the skin every 6 (six) months. Administer in upper arm, thigh, or abdomen     Lutein 20 MG TABS Take one by mouth daily     magnesium oxide (MAG-OX) 400 MG tablet Take 400 mg by mouth 2 (two) times daily.     nitroGLYCERIN (NITROSTAT) 0.4 MG SL tablet Place 1 tablet (0.4 mg total) under the tongue every 5 (five) minutes as needed for chest pain. 50 tablet 3   pravastatin (PRAVACHOL) 40 MG tablet Take 1 tablet (40 mg total) by mouth daily. 90 tablet 1   Riboflavin 100 MG CAPS Take 1 capsule by mouth 2 (two) times daily.      triamterene-hydrochlorothiazide (MAXZIDE-25) 37.5-25 MG tablet TAKE 1 TABLET BY MOUTH DAILY 90 tablet 1   zolpidem (AMBIEN) 10 MG tablet TAKE 1 TABLET(10 MG) BY MOUTH AT BEDTIME 30 tablet 5   predniSONE (DELTASONE) 10 MG tablet  6 tablets on Day 1 , then reduce by 1 tablet daily until gone (Patient not taking: Reported on 11/27/2018) 21 tablet 0   Facility-Administered Medications Prior to Visit  Medication Dose Route Frequency Provider Last Rate Last Dose   denosumab (PROLIA) injection 60 mg  60 mg Subcutaneous Once Sherlene Shams, MD        Review of Systems;  Patient denies headache, fevers, malaise, unintentional weight loss, skin rash, eye pain, sinus congestion and sinus pain, sore throat, dysphagia,  hemoptysis , cough, dyspnea, wheezing, chest pain, palpitations, orthopnea, edema, abdominal pain, nausea, melena, diarrhea, constipation, flank pain, dysuria, hematuria, urinary   Frequency, nocturia, numbness, tingling, seizures,  Focal weakness, Loss of consciousness,  Tremor, insomnia, depression, anxiety, and suicidal ideation.      Objective:  BP 130/76 (BP Location: Left Arm, Patient Position: Sitting, Cuff Size: Normal)    Pulse 72    Temp (!) 97.3 F (36.3 C) (Temporal)    Resp 15    Ht  (1.626 m)    Wt 151 lb 6.4 oz (68.7 kg)    SpO2 97%    BMI 25.99 kg/m   BP Readings from Last 3 Encounters:  11/27/18 130/76  11/08/17 130/70  03/20/17 120/66    Wt Readings from Last 3 Encounters:  11/27/18 151 lb 6.4 oz (68.7 kg)  11/08/17 151 lb (68.5 kg)  03/20/17 149 lb 6.4 oz (67.8 kg)    General appearance: alert, cooperative and appears stated age Ears: normal TM's and external ear canals both ears Throat: lips, mucosa, and tongue normal; teeth and gums normal Neck: no adenopathy, no carotid bruit, supple, symmetrical, trachea midline and thyroid not enlarged, symmetric, no tenderness/mass/nodules Back: symmetric, no curvature. ROM normal. No CVA tenderness. Lungs: clear to auscultation bilaterally Heart: regular rate and rhythm, S1, S2 normal, no murmur, click, rub or gallop Abdomen: soft, non-tender; bowel sounds normal; no masses,  no organomegaly Pulses: 2+ and symmetric Skin: Skin color, texture, turgor normal. No rashes or lesions Lymph nodes: Cervical, supraclavicular, and axillary nodes normal.  Lab Results  Component Value Date   HGBA1C 5.4 05/05/2014    Lab Results  Component Value Date   CREATININE 0.75 07/02/2018   CREATININE 0.78 03/20/2017   CREATININE 0.75 07/30/2016    Lab Results  Component Value Date   WBC 6.1 07/02/2018   HGB 14.1 07/02/2018   HCT 40.9 07/02/2018   PLT 270.0 07/02/2018   GLUCOSE 99 07/02/2018   CHOL 212 (H) 07/02/2018   TRIG 97.0 07/02/2018   HDL 88.90 07/02/2018   LDLDIRECT 109.7 09/27/2011   LDLCALC 104 (H) 07/02/2018   ALT 12 07/02/2018   AST 15 07/02/2018   NA 136 07/02/2018   K 4.3  07/02/2018   CL 100 07/02/2018   CREATININE 0.75 07/02/2018   BUN 13 07/02/2018   CO2 28 07/02/2018   TSH 3.05 07/02/2018   HGBA1C 5.4 05/05/2014    Dg Bone Density (dxa)  Result Date: 02/22/2016 EXAM: DUAL X-RAY ABSORPTIOMETRY (DXA) FOR BONE MINERAL DENSITY IMPRESSION: Dear Dr. Darrick Huntsman, Your patient Delice Bison Galvao completed a BMD test on 02/22/2016 using the Lunar iDXA DXA System (analysis version: 14.10) manufactured by [employer]. The following summarizes the results of our evaluation. PATIENT BIOGRAPHICAL: Name: Lewanda, Perea Patient ID: [id number] Birth Date: 1932-06-25 Height: 63.7 in. Gender: Female Exam Date: 02/22/2016 Weight: 147.5 lbs. Indications: Advanced Age, Caucasian, History of Osteoporosis, Hysterectomy, Oophorectomy Bilateral, Postmenopausal Fractures: Left hand, Right foot Treatments: aspirin, Calcium, pravastatin,  Prolia, Vit D ASSESSMENT: The BMD measured at AP Spine L1-L2 is 0.893 g/cm2 with a T-score of -2.3. This patient is considered osteopenic according to World Health Organization [facility](WHO) criteria. L-3 & 4 was excluded due to degenerative changes. Site Region Measured Measured WHO Young Adult BMD Date       Age      Classification T-score AP Spine L1-L2 02/22/2016 81.2 Osteopenia -2.3 0.893 g/cm2 AP Spine L1-L2 01/06/2014 79.1 Osteoporosis -2.8 0.839 g/cm2 AP Spine L1-L2 11/20/2011 77.0 Osteopenia -2.2 0.904 g/cm2 DualFemur Neck Left 02/22/2016 81.2 Osteopenia -1.8 0.792 g/cm2 DualFemur Neck Left 01/06/2014 79.1 Osteopenia -1.9 0.773 g/cm2 DualFemur Neck Left 11/20/2011 77.0 Normal -0.8 0.927 g/cm2 World Health Organization [facility](WHO) criteria for post-menopausal, Caucasian Women: Normal:       T-score at or above -1 SD Osteopenia:   T-score between -1 and -2.5 SD Osteoporosis: T-score at or below -2.5 SD RECOMMENDATIONS: National Osteoporosis Foundation recommends that FDA-approved medical therapies be considered in postmenopausal women and men age 83 or older with a: 1. Hip or  vertebral (clinical or morphometric) fracture. 2. T-score of < -2.5 at the spine or hip. 3. Ten-year fracture probability by FRAX of 3% or greater for hip fracture or 20% or greater for major osteoporotic fracture. All treatment decisions require clinical judgment and consideration of individual patient factors, including patient preferences, co-morbidities, previous drug use, risk factors not captured in the FRAX model (e.g. falls, vitamin D deficiency, increased bone turnover, interval significant decline in bone density) and possible under - or over-estimation of fracture risk by FRAX. All patients should ensure an adequate intake of dietary calcium (1200 mg/d) and vitamin D (800 IU daily) unless contraindicated. FOLLOW-UP: People with diagnosed cases of osteoporosis or at high risk for fracture should have regular bone mineral density tests. For patients eligible for Medicare, routine testing is allowed once every 2 years. The testing frequency can be increased to one year for patients who have rapidly progressing disease, those who are receiving or discontinuing medical therapy to restore bone mass, or have additional risk factors. I have reviewed this report, and agree with the above findings. [facility] Electronically Signed   By: Charlett NoseKevin  Dover M.D.   On: 02/22/2016 15:21    Assessment & Plan:   Problem List Items Addressed This Visit      Unprioritized   Osteoporosis    Diagnosed in 2015 with T score of -2.8 in spine..  Managed with biannual injections of Prolia, which was started  In March 2017.  Her  Last DEXA was done Jan 2018 and should be repeated this year .  Prolia injection given today.  Reviewed calcium and vitamin D requirements.       Hyperlipidemia with target LDL less than 130    Managed with pravastatin and asa,  lfts are normal     Lab Results  Component Value Date   ALT 12 07/02/2018   AST 15 07/02/2018   ALKPHOS 46 07/02/2018   BILITOT 0.5 07/02/2018          Neuropathy of both feet    Idiopathic,  Since 2013.  Screening labs done and b12 deficiency identified and treated,  Medication deferred.  Lab Results  Component Value Date   VITAMINB12 644 07/02/2018         HTN (hypertension) - Primary    Well controlled on current regimen. Renal function stable, no changes today.      Degenerative joint disease (DJD) of hip  Noted on prior pelvic films .  Referral to orthopedics for joint injection discussed,  Offered by deferred       Other Visit Diagnoses    Long-term use of high-risk medication       Relevant Orders   Comprehensive metabolic panel      I have discontinued Niamh J. Harmes "B Schoppe"'s predniSONE. I am also having her maintain her Lutein, calcium carbonate, Vitamin D3, Riboflavin, Co Q 10, denosumab, magnesium oxide, nitroGLYCERIN, Cyanocobalamin, ALPRAZolam, zolpidem, pravastatin, and triamterene-hydrochlorothiazide. We administered denosumab. We will continue to administer denosumab.  Meds ordered this encounter  Medications   denosumab (PROLIA) injection 60 mg    Medications Discontinued During This Encounter  Medication Reason   predniSONE (DELTASONE) 10 MG tablet Completed Course    Follow-up: No follow-ups on file.   Crecencio Mc, MD

## 2018-11-28 ENCOUNTER — Other Ambulatory Visit: Payer: Self-pay

## 2018-11-29 DIAGNOSIS — M169 Osteoarthritis of hip, unspecified: Secondary | ICD-10-CM | POA: Insufficient documentation

## 2018-11-29 NOTE — Assessment & Plan Note (Signed)
Noted on prior pelvic films .  Referral to orthopedics for joint injection discussed,  Offered by deferred

## 2018-11-29 NOTE — Addendum Note (Signed)
Addended by: Crecencio Mc on: 11/29/2018 07:01 PM   Modules accepted: Orders

## 2018-11-29 NOTE — Assessment & Plan Note (Signed)
Well controlled on current regimen. Renal function stable, no changes today. 

## 2018-11-29 NOTE — Assessment & Plan Note (Signed)
Idiopathic,  Since 2013.  Screening labs done and b12 deficiency identified and treated,  Medication deferred.  Lab Results  Component Value Date   [id number] 159 07/02/2018

## 2018-11-29 NOTE — Assessment & Plan Note (Signed)
Managed with pravastatin and asa,  lfts are normal     Lab Results  Component Value Date   ALT 12 07/02/2018   AST 15 07/02/2018   ALKPHOS 46 07/02/2018   BILITOT 0.5 07/02/2018

## 2018-11-29 NOTE — Assessment & Plan Note (Signed)
Diagnosed in 2015 with T score of -2.8 in spine..  Managed with biannual injections of Prolia, which was started  In March 2017.  Her  Last DEXA was done Jan 2018 and should be repeated this year .  Prolia injection given today.  Reviewed calcium and vitamin D requirements.

## 2018-12-01 ENCOUNTER — Other Ambulatory Visit: Payer: Self-pay | Admitting: Internal Medicine

## 2018-12-01 ENCOUNTER — Other Ambulatory Visit: Payer: Self-pay

## 2018-12-01 ENCOUNTER — Other Ambulatory Visit (INDEPENDENT_AMBULATORY_CARE_PROVIDER_SITE_OTHER): Payer: Medicare Other

## 2018-12-01 DIAGNOSIS — Z79899 Other long term (current) drug therapy: Secondary | ICD-10-CM

## 2018-12-01 DIAGNOSIS — E538 Deficiency of other specified B group vitamins: Secondary | ICD-10-CM

## 2018-12-01 LAB — COMPREHENSIVE METABOLIC PANEL
ALT: 14 U/L (ref 0–35)
AST: 16 U/L (ref 0–37)
Albumin: 4.6 g/dL (ref 3.5–5.2)
Alkaline Phosphatase: 55 U/L (ref 39–117)
BUN: 16 mg/dL (ref 6–23)
CO2: 29 mEq/L (ref 19–32)
Calcium: 9.9 mg/dL (ref 8.4–10.5)
Chloride: 100 mEq/L (ref 96–112)
Creatinine, Ser: 0.7 mg/dL (ref 0.40–1.20)
GFR: 79.71 mL/min (ref >60.00)
Glucose, Bld: 89 mg/dL (ref 70–99)
Potassium: 4.1 mEq/L (ref 3.5–5.1)
Sodium: 137 mEq/L (ref 135–145)
Total Bilirubin: 0.4 mg/dL (ref 0.2–1.2)
Total Protein: 7 g/dL (ref 6.0–8.3)

## 2018-12-01 LAB — VITAMIN B12: Vitamin B-12: 703 pg/mL (ref 211–911)

## 2018-12-01 MED ORDER — ALPRAZOLAM 0.5 MG PO TABS: 0.5000 mg | ORAL_TABLET | Freq: Every evening | ORAL | 5 refills | Status: DC | PRN

## 2018-12-01 MED ORDER — ZOLPIDEM TARTRATE 10 MG PO TABS: ORAL_TABLET | ORAL | 5 refills | Status: DC

## 2018-12-15 ENCOUNTER — Telehealth: Payer: Self-pay | Admitting: *Deleted

## 2018-12-15 NOTE — Telephone Encounter (Signed)
Patient says she had a strange thing happened started with daughter anxiety attack, and today she was at bathroom sink and she felt like she was on a roller coaster and felt like her stomach was coming up to her head lasted a bout five seconds, denies dizziness, had slight tension headache about 2 hours after to back of head lasted about 30 minutes to an hour tension to back head. . Could this coming from dealing with stress with her daughter. She feels fine now speaking very coherently and very aware to person place and time.  Patient does not want to see anyone but PCP scheduled for Wednesday.

## 2018-12-15 NOTE — Telephone Encounter (Signed)
Copied from [place] [phone]. Topic: General - Other >> Dec 15, 2018  2:22 PM Keene Breath wrote: Reason for CRM: Patient called to schedule an immediate appt. With Dr. Derrel Nip.  She stated that she thinks she had a mild seizure.  Did not want to speak with NT.  Called office and they ask if we could send a message for them to get it approved by the doctor.  Relayed this to patient and she understood.  Please call patient back at [phone]

## 2018-12-15 NOTE — Telephone Encounter (Signed)
Ok to wait until appt

## 2018-12-15 NOTE — Telephone Encounter (Signed)
Patient aware.

## 2018-12-17 ENCOUNTER — Ambulatory Visit (INDEPENDENT_AMBULATORY_CARE_PROVIDER_SITE_OTHER): Payer: Medicare Other | Admitting: Internal Medicine

## 2018-12-17 ENCOUNTER — Encounter: Payer: Self-pay | Admitting: Internal Medicine

## 2018-12-17 ENCOUNTER — Other Ambulatory Visit: Payer: Self-pay

## 2018-12-17 VITALS — Ht 64.0 in | Wt 148.0 lb

## 2018-12-17 DIAGNOSIS — R404 Transient alteration of awareness: Secondary | ICD-10-CM | POA: Diagnosis not present

## 2018-12-17 NOTE — Progress Notes (Signed)
Virtual Visit via [email or web address]  This visit type was conducted due to national recommendations for restrictions regarding the COVID-19 pandemic (e.g. social distancing).  This format is felt to be most appropriate for this patient at this time.  All issues noted in this document were discussed and addressed.  No physical exam was performed (except for noted visual exam findings with Video Visits).   I connected with@ on 12/17/18 at  8:30 AM EST by a video enabled telemedicine application and verified that I am speaking with the correct person using two identifiers. Location patient: home Location provider: work or home office Persons participating in the virtual visit: patient, provider  I discussed the limitations, risks, security and privacy concerns of performing an evaluation and management service by telephone and the availability of in person appointments. I also discussed with the patient that there may be a patient responsible charge related to this service. The patient expressed understanding and agreed to proceed.  Reason for visit: recent episode of vertigo    HPI:  85 yr old female with history of , b12 deficiency with neuorpathy of both feet,   Migraine,  History of transient aphasia in 2016  (MRI noted slowly enlarging 4 cm arachnoid cyst posterior left frontal region first seen in 2007 ). Had an episode on Monday of altered sensation   Felt like she was on a rollercoaster ride "everything was rising"  Lasted < 5 sec  No LOC>    The patient has no signs or symptoms of COVID 19 infection (fever, cough, sore throat  or shortness of breath beyond what is typical for patient).  Patient denies contact with other persons with the above mentioned symptoms or with anyone confirmed to have COVID 19    ROS: Patient denies headache, fevers, malaise, unintentional weight loss, skin rash, eye pain, sinus congestion and sinus pain, sore throat, dysphagia,  hemoptysis , cough, dyspnea, wheezing, chest  pain, palpitations, orthopnea, edema, abdominal pain, nausea, melena, diarrhea, constipation, flank pain, dysuria, hematuria, urinary  Frequency, nocturia, numbness, tingling, seizures,  Focal weakness, Loss of consciousness,  Tremor, insomnia, depression, anxiety, and suicidal ideation.      Past Medical History:  Diagnosis Date  . Accidental fall 10/16/2013  . Carotid stenosis    Left   . High cholesterol   . Hypertension    Pt states that her blood pressure is very well controlled  . Migraine with aura   . Osteoporosis     Past Surgical History:  Procedure Laterality Date  . ABDOMINAL HYSTERECTOMY     secondary to peritonitis and complications   . APPENDECTOMY    . TONSILLECTOMY    . TUBAL LIGATION     at age 75, vaginal complicated by peritonitis    Family History  Adopted: Yes  Problem Relation Age of Onset  . Heart disease Mother   . Breast cancer Mother 89  . Cancer Mother        Breast   . Breast cancer Sister 37  . Cancer Daughter        Breast     SOCIAL HX:  reports that she quit smoking about 37 years ago. She has never used smokeless tobacco. She reports current alcohol use of about 14.0 standard drinks of alcohol per week. She reports that she does not use drugs.   Current Outpatient Medications:  .  ALPRAZolam (XANAX) 0.5 MG tablet, Take 1 tablet (0.5 mg total) by mouth at bedtime as needed. for sleep, Disp: 30  tablet, Rfl: 5 .  calcium carbonate (OS-CAL) 600 MG TABS, Take 600 mg by mouth 2 (two) times daily with a meal., Disp: , Rfl:  .  Cholecalciferol (VITAMIN D3) 1000 UNITS CAPS, Take one by mouth twice a day., Disp: , Rfl:  .  Coenzyme Q10 (CO Q 10) 100 MG CAPS, Take 1 capsule by mouth daily., Disp: , Rfl:  .  Cyanocobalamin 2500 MCG SUBL, PLACE 1 TABLET UNDER THE TONGUE DAILY AS DIRECTED, Disp: 90 tablet, Rfl: 1 .  denosumab (PROLIA) 60 MG/ML SOLN injection, Inject 60 mg into the skin every 6 (six) months. Administer in upper arm, thigh, or abdomen,  Disp: , Rfl:  .  Lutein 20 MG TABS, Take one by mouth daily, Disp: , Rfl:  .  magnesium oxide (MAG-OX) 400 MG tablet, Take 400 mg by mouth 2 (two) times daily., Disp: , Rfl:  .  nitroGLYCERIN (NITROSTAT) 0.4 MG SL tablet, Place 1 tablet (0.4 mg total) under the tongue every 5 (five) minutes as needed for chest pain., Disp: 50 tablet, Rfl: 3 .  pravastatin (PRAVACHOL) 40 MG tablet, Take 1 tablet (40 mg total) by mouth daily., Disp: 90 tablet, Rfl: 1 .  Riboflavin 100 MG CAPS, Take 1 capsule by mouth 2 (two) times daily. , Disp: , Rfl:  .  triamterene-hydrochlorothiazide (MAXZIDE-25) 37.5-25 MG tablet, TAKE 1 TABLET BY MOUTH DAILY, Disp: 90 tablet, Rfl: 1 .  zolpidem (AMBIEN) 10 MG tablet, TAKE 1 TABLET(10 MG) BY MOUTH AT BEDTIME, Disp: 30 tablet, Rfl: 5  Current Facility-Administered Medications:  .  denosumab (PROLIA) injection 60 mg, 60 mg, Subcutaneous, Once, Derrel Nip, Aris Everts, MD  EXAM:  VITALS per patient if applicable:  GENERAL: alert, oriented, appears well and in no acute distress  HEENT: atraumatic, conjunttiva clear, no obvious abnormalities on inspection of external nose and ears  NECK: normal movements of the head and neck  LUNGS: on inspection no signs of respiratory distress, breathing rate appears normal, no obvious gross SOB, gasping or wheezing  CV: no obvious cyanosis  MS: moves all visible extremities without noticeable abnormality  PSYCH; pleasant and cooperative, no obvious depression or anxiety, speech and thought processing grossly intact  Neuro:  awake and interactive with normal mood and affect. Higher cortical functions are normal. Speech is clear without word-finding difficulty or dysarthria. Extraocular movements are intact.  There is no pronation or drift. Gait is non-ataxic    ASSESSMENT AND PLAN:  Discussed the following assessment and plan:  Altered level of consciousness - Plan: Ambulatory referral to Neurology  Altered level of  consciousness Recent episode of altered sensation was not accompanied by headache, aphasia or weakness  But may have been a partial seizure.  She has a slowly enlarging arachnoid cyst in the left frontal lobe.  Will refer to Dr Krista Blue for evaluation.  Advised no to drive until neurology sees her     I discussed the assessment and treatment plan with the patient. The patient was provided an opportunity to ask questions and all were answered. The patient agreed with the plan and demonstrated an understanding of the instructions.   The patient was advised to call back or seek an in-person evaluation if the symptoms worsen or if the condition fails to improve as anticipated.  I provided  25 minutes of non-face-to-face time during this encounter reviewing patient's current problems and post surgeries.  Providing counseling on the above mentioned problems , and coordination  of care .  Crecencio Mc, MD

## 2018-12-17 NOTE — Assessment & Plan Note (Signed)
Recent episode of altered sensation was not accompanied by headache, aphasia or weakness  But may have been a partial seizure.  She has a slowly enlarging arachnoid cyst in the left frontal lobe.  Will refer to Dr Krista Blue for evaluation.  Advised no to drive until neurology sees her

## 2018-12-23 ENCOUNTER — Other Ambulatory Visit: Payer: Self-pay

## 2018-12-23 MED ORDER — PRAVASTATIN SODIUM 40 MG PO TABS: 40.0000 mg | ORAL_TABLET | Freq: Every day | ORAL | 3 refills | Status: DC

## 2018-12-31 ENCOUNTER — Other Ambulatory Visit: Payer: Self-pay

## 2018-12-31 ENCOUNTER — Encounter: Payer: Self-pay | Admitting: Neurology

## 2018-12-31 ENCOUNTER — Ambulatory Visit: Payer: Medicare Other | Admitting: Neurology

## 2018-12-31 VITALS — BP 131/72 | HR 90 | Temp 98.0°F | Ht 64.0 in | Wt 153.0 lb

## 2018-12-31 DIAGNOSIS — R42 Dizziness and giddiness: Secondary | ICD-10-CM | POA: Diagnosis not present

## 2018-12-31 NOTE — Progress Notes (Signed)
PATIENT: Laura [name] DOB: [birth year]  Chief Complaint  Patient presents with  . r/o Seizure    Last seen 11/2014 for migraines.  Reports an episode of altered level of consciousness on 12/15/2018 (felt like being on a "roller coaster").  Denies passing out.  Symptoms lasted less than 5 seconds.  No further issues.  States she has hx of vertigo but this felt different.  Her PCP wanted to rule out seizure activity.  Marland Kitchen. PCP    Sherlene Shamsullo, Teresa L, MD     HISTORICAL  Laura FilaBettie J Knebel is a 85 years old female, seen in request by his primary care physician Dr. Duncan Dulleresa Tullo for evaluation of dizziness, initial evaluation was on December 31, 2018.  I have reviewed and summarized the referring note from the referring physician.  She has past medical history of hyperlipidemia, hypertension, I saw her previously for chronic migraine with aura  She had her first migraine in 1965 during pregnancy, presented with aphasia followed by severe pounding headaches  Since 1975, she began to have her stereotypical migraine headaches, usually started with colorful sparkling dots in her left visual field, then spreading become a bright yellow C-shaped circle, gradually moving out of her left sometimes right temporal visual field within 30 minutes, she felt mild achiness in her head, but no severe headaches, she is used to have similar episodes 5-6 times each year,  For a while she began to have increased migraine with aura under excessive stress, I personally reviewed MRI of the brain October 10 2014, mild generalized atrophy, small vessel disease, left frontal arachnoid cyst, may slightly increased in size compared to previous MRI in 2007  ultrasound of carotid artery showed 50-69% stenosis of left internal carotid artery, she has been taking magnesium oxide and riboflavin as migraine preventive medication, which is helpful,  She has gone through a lot of family stress recently, has to stop her daily routine  of [employer] workout,  On December 15, 2018, after breakfast, she sit down reading newspaper, then checked her email, when she get up trying to brush her teeth, she felt sudden onset dizziness, lasting only for few seconds, she described as if "the roller coaster was flying down.  She denies chest pain, denied loss of vision, denies lateralized motor or sensory deficit.  Laboratory evaluation in June 2020, normal CBC TSH, magnesium, CMP, B12, vitamin D, lipid panel showed cholesterol 212, LDL 104  REVIEW OF SYSTEMS: Full 14 system review of systems performed and notable only for as above All other review of systems were negative.  ALLERGIES: Allergies  Allergen Reactions  . Erythromycin     cramping    HOME MEDICATIONS: Current Outpatient Medications  Medication Sig Dispense Refill  . ALPRAZolam (XANAX) 0.5 MG tablet Take 1 tablet (0.5 mg total) by mouth at bedtime as needed. for sleep 30 tablet 5  . calcium carbonate (OS-CAL) 600 MG TABS Take 600 mg by mouth 2 (two) times daily with a meal.    . Cholecalciferol (VITAMIN D3) 1000 UNITS CAPS Take one by mouth twice a day.    . Coenzyme Q10 (CO Q 10) 100 MG CAPS Take 1 capsule by mouth daily.    . Cyanocobalamin 2500 MCG SUBL PLACE 1 TABLET UNDER THE TONGUE DAILY AS DIRECTED 90 tablet 1  . denosumab (PROLIA) 60 MG/ML SOLN injection Inject 60 mg into the skin every 6 (six) months. Administer in upper arm, thigh, or abdomen    . Lutein 20 MG  TABS Take one by mouth daily    . magnesium oxide (MAG-OX) 400 MG tablet Take 400 mg by mouth 2 (two) times daily.    . nitroGLYCERIN (NITROSTAT) 0.4 MG SL tablet Place 1 tablet (0.4 mg total) under the tongue every 5 (five) minutes as needed for chest pain. 50 tablet 3  . pravastatin (PRAVACHOL) 40 MG tablet Take 1 tablet (40 mg total) by mouth daily. 90 tablet 3  . Riboflavin 100 MG CAPS Take 1 capsule by mouth 2 (two) times daily.     Marland Kitchen triamterene-hydrochlorothiazide (MAXZIDE-25) 37.5-25 MG tablet TAKE 1  TABLET BY MOUTH DAILY 90 tablet 1  . zolpidem (AMBIEN) 10 MG tablet TAKE 1 TABLET(10 MG) BY MOUTH AT BEDTIME 30 tablet 5   Current Facility-Administered Medications  Medication Dose Route Frequency Provider Last Rate Last Dose  . denosumab (PROLIA) injection 60 mg  60 mg Subcutaneous Once Sherlene Shams, MD        PAST MEDICAL HISTORY: Past Medical History:  Diagnosis Date  . Accidental fall 10/16/2013  . Anxiety   . Carotid stenosis    Left   . High cholesterol   . Hypertension    Pt states that her blood pressure is very well controlled  . Migraine with aura   . Neuropathy   . Osteoporosis     PAST SURGICAL HISTORY: Past Surgical History:  Procedure Laterality Date  . ABDOMINAL HYSTERECTOMY     secondary to peritonitis and complications   . APPENDECTOMY    . TONSILLECTOMY    . TUBAL LIGATION     at age 85, vaginal complicated by peritonitis    FAMILY HISTORY: Family History  Adopted: Yes  Problem Relation Age of Onset  . Cancer Daughter        Breast     SOCIAL HISTORY: Social History   Socioeconomic History  . Marital status: Widowed    Spouse name: Not on file  . Number of children: 2  . Years of education: college  . Highest education level: Master's degree (e.g., MA, MS, MEng, MEd, MSW, MBA)  Occupational History  . Occupation: Retired  Engineer, production  . Financial resource strain: Not on file  . Food insecurity    Worry: Not on file    Inability: Not on file  . Transportation needs    Medical: Not on file    Non-medical: Not on file  Tobacco Use  . Smoking status: Former Smoker    Quit date: 1965    Years since quitting: 55.9  . Smokeless tobacco: Never Used  Substance and Sexual Activity  . Alcohol use: Yes    Alcohol/week: 14.0 standard drinks    Types: 14 Glasses of wine per week    Comment: 2 glasses of wine per day.  . Drug use: No  . Sexual activity: Never  Lifestyle  . Physical activity    Days per week: Not on file    Minutes per  session: Not on file  . Stress: Not on file  Relationships  . Social Musician on phone: Not on file    Gets together: Not on file    Attends religious service: Not on file    Active member of club or organization: Not on file    Attends meetings of clubs or organizations: Not on file    Relationship status: Not on file  . Intimate partner violence    Fear of current or ex partner: Not on file  Emotionally abused: Not on file    Physically abused: Not on file    Forced sexual activity: Not on file  Other Topics Concern  . Not on file  Social History Narrative   Lives at home alone.   Left-handed.   2-4 cups caffeine per day.     PHYSICAL EXAM   Vitals:   12/31/18 1309  BP: 131/72  Pulse: 90  Temp: 98 F (36.7 C)  Weight: 153 lb (69.4 kg)  Height: 5\' 4"  (1.626 m)    Not recorded      Body mass index is 26.26 kg/m.  PHYSICAL EXAMNIATION:  Gen: NAD, conversant, well nourised, well groomed                     Cardiovascular: Regular rate rhythm, no peripheral edema, warm, nontender. Eyes: Conjunctivae clear without exudates or hemorrhage Neck: Supple, no carotid bruits. Pulmonary: Clear to auscultation bilaterally   NEUROLOGICAL EXAM:  MENTAL STATUS: Speech:    Speech is normal; fluent and spontaneous with normal comprehension.  Cognition:     Orientation to time, place and person     Normal recent and remote memory     Normal Attention span and concentration     Normal Language, naming, repeating,spontaneous speech     Fund of knowledge   CRANIAL NERVES: CN II: Visual fields are full to confrontation.Pupils are round equal and briskly reactive to light. CN III, IV, VI: extraocular movement are normal. No ptosis. CN V: Facial sensation is intact to pinprick in all 3 divisions bilaterally. Corneal responses are intact.  CN VII: Face is symmetric with normal eye closure and smile. CN VIII: Hearing is normal to causal conversation. CN IX, X:  Palate elevates symmetrically. Phonation is normal. CN XI: Head turning and shoulder shrug are intact CN XII: Tongue is midline with normal movements and no atrophy.  MOTOR: There is no pronator drift of out-stretched arms. Muscle bulk and tone are normal. Muscle strength is normal.  REFLEXES: Reflexes are 2+ and symmetric at the biceps, triceps, knees, and ankles. Plantar responses are flexor.  SENSORY: Intact to light touch, pinprick and vibratory sensation are intact in fingers and toes.  COORDINATION: There is no trunk or limb dysmetria noted.  GAIT/STANCE: Posture is normal. Gait is steady with normal steps, base, arm swing, and turning.   DIAGNOSTIC DATA (LABS, IMAGING, TESTING) - I reviewed patient records, labs, notes, testing and imaging myself where available.   ASSESSMENT AND PLAN  MARGARETE HORACE is a 85 y.o. female   Sudden onset dizziness on December 15, 2018  Unsure etiology, differentiation diagnosis including hypoperfusion of the brain due to orthostatic blood pressure change, or cardiac arrhythmia  After discussed with patient, we decided to continue to observe her symptoms, she will call me for recurrent symptoms,  Continue well hydration, moderate exercise, aspirin 81 mg daily   Marcial Pacas, M.D. Ph.D.  [facility] [address] [place], [place] [address] Ph: [phone] Fax: [phone]  CC: Referring Provider

## 2019-02-05 ENCOUNTER — Telehealth: Payer: Self-pay | Admitting: Internal Medicine

## 2019-02-05 ENCOUNTER — Other Ambulatory Visit: Payer: Self-pay

## 2019-02-05 DIAGNOSIS — E785 Hyperlipidemia, unspecified: Secondary | ICD-10-CM

## 2019-02-05 DIAGNOSIS — I10 Essential (primary) hypertension: Secondary | ICD-10-CM

## 2019-02-05 NOTE — Telephone Encounter (Signed)
Pt just had lab work in 06/2018. I have ordered CMP, and lipid panel. Is there anything else that needs to be ordered? Pt is scheduled for lab work on 02/10/2019.

## 2019-02-05 NOTE — Telephone Encounter (Signed)
Pt needs orders for lab appt on 01/12. Please and Thank you!

## 2019-02-08 ENCOUNTER — Other Ambulatory Visit: Payer: Self-pay | Admitting: Internal Medicine

## 2019-02-08 DIAGNOSIS — R350 Frequency of micturition: Secondary | ICD-10-CM

## 2019-02-10 ENCOUNTER — Other Ambulatory Visit (INDEPENDENT_AMBULATORY_CARE_PROVIDER_SITE_OTHER): Payer: Medicare PPO

## 2019-02-10 ENCOUNTER — Other Ambulatory Visit: Payer: Self-pay

## 2019-02-10 DIAGNOSIS — R350 Frequency of micturition: Secondary | ICD-10-CM | POA: Diagnosis not present

## 2019-02-10 DIAGNOSIS — E785 Hyperlipidemia, unspecified: Secondary | ICD-10-CM

## 2019-02-10 DIAGNOSIS — I10 Essential (primary) hypertension: Secondary | ICD-10-CM | POA: Diagnosis not present

## 2019-02-10 DIAGNOSIS — R358 Other polyuria: Secondary | ICD-10-CM | POA: Diagnosis not present

## 2019-02-10 LAB — LIPID PANEL
Cholesterol: 205 mg/dL — ABNORMAL HIGH (ref 0–200)
HDL: 88.9 mg/dL (ref >39.00)
LDL Cholesterol: 100 mg/dL — ABNORMAL HIGH (ref 0–99)
NonHDL: 115.87
Total CHOL/HDL Ratio: 2
Triglycerides: 79 mg/dL (ref 0.0–149.0)
VLDL: 15.8 mg/dL (ref 0.0–40.0)

## 2019-02-10 LAB — URINALYSIS, ROUTINE W REFLEX MICROSCOPIC
Bilirubin Urine: NEGATIVE
Hgb urine dipstick: NEGATIVE
Ketones, ur: NEGATIVE
Leukocytes,Ua: NEGATIVE
Nitrite: NEGATIVE
RBC / HPF: NONE SEEN (ref 0–?)
Specific Gravity, Urine: 1.015 (ref 1.000–1.030)
Total Protein, Urine: NEGATIVE
Urine Glucose: NEGATIVE
Urobilinogen, UA: 0.2 (ref 0.0–1.0)
WBC, UA: NONE SEEN (ref 0–?)
pH: 7.5 (ref 5.0–8.0)

## 2019-02-10 LAB — COMPREHENSIVE METABOLIC PANEL
ALT: 16 U/L (ref 0–35)
AST: 18 U/L (ref 0–37)
Albumin: 4.5 g/dL (ref 3.5–5.2)
Alkaline Phosphatase: 54 U/L (ref 39–117)
BUN: 14 mg/dL (ref 6–23)
CO2: 29 mEq/L (ref 19–32)
Calcium: 9.3 mg/dL (ref 8.4–10.5)
Chloride: 101 mEq/L (ref 96–112)
Creatinine, Ser: 0.75 mg/dL (ref 0.40–1.20)
GFR: 73.58 mL/min (ref >60.00)
Glucose, Bld: 109 mg/dL — ABNORMAL HIGH (ref 70–99)
Potassium: 4.1 mEq/L (ref 3.5–5.1)
Sodium: 137 mEq/L (ref 135–145)
Total Bilirubin: 0.4 mg/dL (ref 0.2–1.2)
Total Protein: 7.3 g/dL (ref 6.0–8.3)

## 2019-02-10 LAB — HEMOGLOBIN A1C: Hgb A1c MFr Bld: 5.6 % (ref 4.6–6.5)

## 2019-02-12 LAB — URINE CULTURE
MICRO NUMBER:: 10032674
Result:: NO GROWTH
SPECIMEN QUALITY:: ADEQUATE

## 2019-03-04 ENCOUNTER — Other Ambulatory Visit: Payer: Self-pay | Admitting: Internal Medicine

## 2019-03-04 DIAGNOSIS — Z1231 Encounter for screening mammogram for malignant neoplasm of breast: Secondary | ICD-10-CM

## 2019-03-05 ENCOUNTER — Ambulatory Visit
Admission: RE | Admit: 2019-03-05 | Discharge: 2019-03-05 | Disposition: A | Discharge status: Home / Self Care | Payer: Medicare PPO | Source: Ambulatory Visit | Attending: Internal Medicine | Admitting: Internal Medicine

## 2019-03-05 ENCOUNTER — Other Ambulatory Visit: Payer: Self-pay

## 2019-03-05 DIAGNOSIS — M8589 Other specified disorders of bone density and structure, multiple sites: Secondary | ICD-10-CM | POA: Diagnosis not present

## 2019-03-05 DIAGNOSIS — M85852 Other specified disorders of bone density and structure, left thigh: Secondary | ICD-10-CM | POA: Diagnosis not present

## 2019-03-05 DIAGNOSIS — Z1231 Encounter for screening mammogram for malignant neoplasm of breast: Secondary | ICD-10-CM

## 2019-03-05 DIAGNOSIS — M810 Age-related osteoporosis without current pathological fracture: Secondary | ICD-10-CM

## 2019-03-25 ENCOUNTER — Other Ambulatory Visit: Payer: Medicare PPO

## 2019-04-13 ENCOUNTER — Telehealth: Payer: Self-pay | Admitting: Internal Medicine

## 2019-04-13 NOTE — Telephone Encounter (Signed)
Prolia injection scheduled for 05/28/19

## 2019-05-20 DIAGNOSIS — H2513 Age-related nuclear cataract, bilateral: Secondary | ICD-10-CM | POA: Diagnosis not present

## 2019-05-28 ENCOUNTER — Ambulatory Visit: Payer: Medicare PPO

## 2019-06-01 ENCOUNTER — Other Ambulatory Visit: Payer: Self-pay

## 2019-06-03 ENCOUNTER — Encounter: Payer: Self-pay | Admitting: Internal Medicine

## 2019-06-03 ENCOUNTER — Ambulatory Visit: Payer: Medicare PPO | Admitting: Internal Medicine

## 2019-06-03 ENCOUNTER — Other Ambulatory Visit: Payer: Self-pay

## 2019-06-03 VITALS — BP 116/72 | HR 99 | Temp 97.7°F | Resp 14 | Ht 64.0 in | Wt 149.2 lb

## 2019-06-03 DIAGNOSIS — Z79899 Other long term (current) drug therapy: Secondary | ICD-10-CM | POA: Diagnosis not present

## 2019-06-03 DIAGNOSIS — I10 Essential (primary) hypertension: Secondary | ICD-10-CM

## 2019-06-03 DIAGNOSIS — G4700 Insomnia, unspecified: Secondary | ICD-10-CM

## 2019-06-03 DIAGNOSIS — R404 Transient alteration of awareness: Secondary | ICD-10-CM | POA: Diagnosis not present

## 2019-06-03 DIAGNOSIS — M810 Age-related osteoporosis without current pathological fracture: Secondary | ICD-10-CM | POA: Diagnosis not present

## 2019-06-03 DIAGNOSIS — E559 Vitamin D deficiency, unspecified: Secondary | ICD-10-CM

## 2019-06-03 DIAGNOSIS — M7062 Trochanteric bursitis, left hip: Secondary | ICD-10-CM

## 2019-06-03 MED ORDER — ZOLPIDEM TARTRATE 10 MG PO TABS: ORAL_TABLET | ORAL | 5 refills | Status: DC

## 2019-06-03 MED ORDER — DENOSUMAB 60 MG/ML ~~LOC~~ SOSY
60.0000 mg | PREFILLED_SYRINGE | Freq: Once | SUBCUTANEOUS | Status: AC
  Administered 2019-06-03: 60 mg via SUBCUTANEOUS

## 2019-06-03 NOTE — Progress Notes (Signed)
Subjective:  Patient ID: Laura [name], female    DOB: 1932-03-15  Age: 86 y.o. MRN: [id number]  CC: The primary encounter diagnosis was Vitamin D deficiency. Diagnoses of Long-term use of high-risk medication, Age-related osteoporosis without current pathological fracture, Altered level of consciousness, Trochanteric bursitis of left hip, Essential hypertension, and Insomnia, unspecified type were also pertinent to this visit.  HPI Laura [name] presents for follow up om multiple issues  This visit occurred during the SARS-CoV-2 public health emergency.  Safety protocols were in place, including screening questions prior to the visit, additional usage of staff PPE, and extensive cleaning of exam room while observing appropriate contact time as indicated for disinfecting solutions.    Patient has received both doses of the available COVID 19 vaccine without complications.  Patient continues to mask when outside of the home except when walking in yard or at safe distances from others .  Patient denies any change in mood or development of unhealthy behaviors resuting from the pandemic's restriction of activities and socialization.    1) hip bursitis  Right side   AGGRAVATED BY CAR TRIP TO [place] OVER EASTER.  Improving with use of advil during the day and a nocturnal application of an ointment that contains THC that was given to her  In limited quantity  By her son . She denies pain with movement .  Pain occurs only with prlonged sitting.  [facility]  Using a gel cushion   2) AMS:  Last seen in November with a transient episode of altered sensorium that was described as an ascending flush.  She returned  to Dr Terrace Arabia at [facility] in [place],  but did not have a good experience ("she didn't listen"). Has not had any subsequent episodes.   3) Osteoporosis :  T scores have improved and are now in the osteopenia range on Prolia.  : 4) Insomnia:  still using ambien    Outpatient Medications Prior to Visit  Medication Sig Dispense Refill  . ALPRAZolam (XANAX) 0.5 MG tablet Take 1 tablet (0.5 mg total) by mouth at bedtime as needed. for sleep 30 tablet 5  . calcium carbonate (OS-CAL) 600 MG TABS Take 600 mg by mouth 2 (two) times daily with a meal.    . Cholecalciferol (VITAMIN D3) 1000 UNITS CAPS Take one by mouth twice a day.    . Coenzyme Q10 (CO Q 10) 100 MG CAPS Take 1 capsule by mouth daily.    . Cyanocobalamin 2500 MCG SUBL PLACE 1 TABLET UNDER THE TONGUE DAILY AS DIRECTED 90 tablet 1  . denosumab (PROLIA) 60 MG/ML SOLN injection Inject 60 mg into the skin every 6 (six) months. Administer in upper arm, thigh, or abdomen    . Lutein 20 MG TABS Take one by mouth daily    . magnesium oxide (MAG-OX) 400 MG tablet Take 400 mg by mouth 2 (two) times daily.    . nitroGLYCERIN (NITROSTAT) 0.4 MG SL tablet Place 1 tablet (0.4 mg total) under the tongue every 5 (five) minutes as needed for chest pain. 50 tablet 3  . pravastatin (PRAVACHOL) 40 MG tablet Take 1 tablet (40 mg total) by mouth daily. 90 tablet 3  . Riboflavin 100 MG CAPS Take 1 capsule by mouth 2 (two) times daily.     Marland Kitchen triamterene-hydrochlorothiazide (MAXZIDE-25) 37.5-25 MG tablet TAKE 1 TABLET BY MOUTH DAILY 90 tablet 1  . zolpidem (AMBIEN) 10 MG tablet TAKE 1 TABLET(10 MG)  BY MOUTH AT BEDTIME 30 tablet 5   Facility-Administered Medications Prior to Visit  Medication Dose Route Frequency Provider Last Rate Last Admin  . denosumab (PROLIA) injection 60 mg  60 mg Subcutaneous Once Sherlene Shams, MD        Review of Systems;  Patient denies headache, fevers, malaise, unintentional weight loss, skin rash, eye pain, sinus congestion and sinus pain, sore throat, dysphagia,  hemoptysis , cough, dyspnea, wheezing, chest pain, palpitations, orthopnea, edema, abdominal pain, nausea, melena, diarrhea, constipation, flank pain, dysuria, hematuria, urinary  Frequency, nocturia, numbness, tingling,  seizures,  Focal weakness, Loss of consciousness,  Tremor, insomnia, depression, anxiety, and suicidal ideation.      Objective:  BP 116/72 (BP Location: Left Arm, Patient Position: Sitting, Cuff Size: Normal)   Pulse 99   Temp 97.7 F (36.5 C) (Temporal)   Resp 14   Ht 5\' 4"  (1.626 m)   Wt 149 lb 3.2 oz (67.7 kg)   SpO2 97%   BMI 25.61 kg/m   BP Readings from Last 3 Encounters:  06/03/19 116/72  12/31/18 131/72  11/27/18 130/76    Wt Readings from Last 3 Encounters:  06/03/19 149 lb 3.2 oz (67.7 kg)  12/31/18 153 lb (69.4 kg)  12/17/18 148 lb (67.1 kg)    General appearance: alert, cooperative and appears stated age Ears: normal TM's and external ear canals both ears Throat: lips, mucosa, and tongue normal; teeth and gums normal Neck: no adenopathy, no carotid bruit, supple, symmetrical, trachea midline and thyroid not enlarged, symmetric, no tenderness/mass/nodules Back: symmetric, no curvature. ROM normal. No CVA tenderness. Lungs: clear to auscultation bilaterally Heart: regular rate and rhythm, S1, S2 normal, no murmur, click, rub or gallop Abdomen: soft, non-tender; bowel sounds normal; no masses,  no organomegaly Pulses: 2+ and symmetric Skin: Skin color, texture, turgor normal. No rashes or lesions Lymph nodes: Cervical, supraclavicular, and axillary nodes normal.  Lab Results  Component Value Date   HGBA1C 5.6 02/10/2019   HGBA1C 5.4 05/05/2014    Lab Results  Component Value Date   CREATININE 0.75 02/10/2019   CREATININE 0.70 12/01/2018   CREATININE 0.75 07/02/2018    Lab Results  Component Value Date   WBC 6.1 07/02/2018   HGB 14.1 07/02/2018   HCT 40.9 07/02/2018   PLT 270.0 07/02/2018   GLUCOSE 109 (H) 02/10/2019   CHOL 205 (H) 02/10/2019   TRIG 79.0 02/10/2019   HDL 88.90 02/10/2019   LDLDIRECT 109.7 09/27/2011   LDLCALC 100 (H) 02/10/2019   ALT 16 02/10/2019   AST 18 02/10/2019   NA 137 02/10/2019   K 4.1 02/10/2019   CL 101  02/10/2019   CREATININE 0.75 02/10/2019   BUN 14 02/10/2019   CO2 29 02/10/2019   TSH 3.05 07/02/2018   HGBA1C 5.6 02/10/2019    DG Bone Density  Result Date: 03/05/2019 EXAM: DUAL X-RAY ABSORPTIOMETRY (DXA) FOR BONE MINERAL DENSITY IMPRESSION: Your patient Laura [name] completed a BMD test on 03/05/2019 using the 05/03/2019 Advance DXA System (analysis version: 14.10) manufactured by [employer]. The following summarizes the results of our evaluation. PATIENT BIOGRAPHICAL: Name: Laura, [name] Patient ID: Laura [name] Birth Date: [birth year] Height: 63.5 in. Gender: Female Exam Date: 03/05/2019 Weight: 149.2 lbs. Indications: Advanced Age, Bilateral Ovariectomy, Caucasian, Height Loss, History of Fracture (Adult), Hysterectomy, Osteoporotic, POSTmenopausal Fractures: lt hand, rt foot Treatments: Calcium, prolia, Vitamin D ASSESSMENT: The BMD measured at Femur Neck Left is 0.779 g/cm2 with a T-score of -1.9. This patient  is considered osteopenic according to World Health Organization [facility]) criteria. L-2 &4 were excluded due to degenerative changes. The scan quality is limited by exclusion of L-2&4. Patient is not a candidate for FRAX assessment due to Prolia use. Site Region Measured Measured WHO Young Adult BMD Date       Age      Classification T-score AP Spine L1-L3 (L2) 5/05/[age] 84.3 Osteopenia -1.1 1.031 g/cm2 DualFemur Neck Left 5/05/2019 84.3 Osteopenia -1.9 0.779 g/cm2 World Health Organization [facility]) criteria for post-menopausal, Caucasian Women: Normal:       T-score at or above -1 SD Osteopenia:   T-score between -1 and -2.5 SD Osteoporosis: T-score at or below -2.5 SD RECOMMENDATIONS: 1. All patients should optimize calcium and vitamin D intake. 2. Consider FDA-approved medical therapies in postmenopausal women and men aged 32 years and older, based on the following: a. A hip or vertebral (clinical or morphometric) fracture b. T-score < -2.5 at the femoral neck or spine after appropriate  evaluation to exclude secondary causes c. Low bone mass (T-score between -1.0 and -2.5 at the femoral neck or spine) and a 10-year probability of a hip fracture > 3% or a 10-year probability of a major osteoporosis-related fracture > 20% based on the [place]-adapted WHO algorithm d. Clinician judgment and/or patient preferences may indicate treatment for people with 10-year fracture probabilities above or below these levels FOLLOW-UP: People with diagnosed cases of osteoporosis or at high risk for fracture should have regular bone mineral density tests. For patients eligible for Medicare, routine testing is allowed once every 2 years. The testing frequency can be increased to one year for patients who have rapidly progressing disease, those who are receiving or discontinuing medical therapy to restore bone mass, or have additional risk factors. Electronically Signed   By: Danae Orleans M.D.   On: 03/05/2019 09:04   MM 3D SCREEN BREAST BILATERAL  Result Date: 03/05/2019 CLINICAL DATA:  Screening. EXAM: DIGITAL SCREENING BILATERAL MAMMOGRAM WITH TOMO AND CAD COMPARISON:  Previous exam(s). ACR Breast Density Category b: There are scattered areas of fibroglandular density. FINDINGS: There are no findings suspicious for malignancy. Images were processed with CAD. IMPRESSION: No mammographic evidence of malignancy. A result letter of this screening mammogram will be mailed directly to the patient. RECOMMENDATION: Screening mammogram in one year. (Code:SM-B-01Y) BI-RADS CATEGORY  1: Negative. Electronically Signed   By: Harmon Pier M.D.   On: 03/05/2019 12:36    Assessment & Plan:   Problem List Items Addressed This Visit      Unprioritized   Altered level of consciousness    She has had no subsequent episodes ;  The previous episode was again described and may have been an episode of flushing       Bursitis of left hip    Improved with exercise and use of a thc containing compound.        HTN (hypertension)     Well controlled on current regimen. Renal function stable, no changes today.  Lab Results  Component Value Date   CREATININE 0.75 02/10/2019   Lab Results  Component Value Date   NA 137 02/10/2019   K 4.1 02/10/2019   CL 101 02/10/2019   CO2 29 02/10/2019         Insomnia    I have reviewed the dangers of prescribing this drug to the elderly with patient.  She has been [place] dependent for over two years, and prior attempts to use an alternative medication  have been unsuccessful resulting in loss of sleep..She is willing to accept the risks of adverse events. Refilled today       Osteoporosis    T scores have improved into the osteopenia range.  Continue Prolia        Other Visit Diagnoses    Vitamin D deficiency    -  Primary   Relevant Orders   VITAMIN D 25 Hydroxy (Vit-D Deficiency, Fractures)   Long-term use of high-risk medication       Relevant Orders   Comprehensive metabolic panel      I provided  30 minutes of  face-to-face time during this encounter reviewing patient's current problems and past surgeries, labs and imaging studies, providing counseling on the above mentioned problems , and coordination  of care .  I am having Laura [name] "B Mccullers" maintain her Lutein, calcium carbonate, Vitamin D3, Riboflavin, Co Q 10, denosumab, magnesium oxide, nitroGLYCERIN, Cyanocobalamin, triamterene-hydrochlorothiazide, ALPRAZolam, pravastatin, and zolpidem. We administered denosumab. We will continue to administer denosumab.  Meds ordered this encounter  Medications  . zolpidem (AMBIEN) 10 MG tablet    Sig: TAKE 1 TABLET(10 MG) BY MOUTH AT BEDTIME    Dispense:  30 tablet    Refill:  5  . denosumab (PROLIA) injection 60 mg    Medications Discontinued During This Encounter  Medication Reason  . zolpidem (AMBIEN) 10 MG tablet Reorder    Follow-up: Return in about 6 months (around 12/04/2019).   Crecencio Mc, MD

## 2019-06-03 NOTE — Patient Instructions (Signed)
Return in late July for labs only  (non fasting)  See you in 6 months

## 2019-06-04 NOTE — Assessment & Plan Note (Addendum)
Well controlled on current regimen. Renal function stable, no changes today.  Lab Results  Component Value Date   CREATININE 0.75 02/10/2019   Lab Results  Component Value Date   NA 137 02/10/2019   K 4.1 02/10/2019   CL 101 02/10/2019   CO2 29 02/10/2019

## 2019-06-04 NOTE — Assessment & Plan Note (Signed)
She has had no subsequent episodes ;  The previous episode was again described and may have been an episode of flushing

## 2019-06-04 NOTE — Assessment & Plan Note (Signed)
T scores have improved into the osteopenia range.  Continue Prolia

## 2019-06-04 NOTE — Assessment & Plan Note (Signed)
Improved with exercise and use of a thc containing compound.

## 2019-06-04 NOTE — Assessment & Plan Note (Signed)
I have reviewed the dangers of prescribing this drug to the elderly with patient.  She has been [place] dependent for over two years, and prior attempts to use an alternative medication  have been unsuccessful resulting in loss of sleep..She is willing to accept the risks of adverse events. Refilled today

## 2019-06-18 ENCOUNTER — Ambulatory Visit: Payer: Medicare PPO

## 2019-07-17 ENCOUNTER — Ambulatory Visit: Payer: Medicare PPO

## 2019-07-22 ENCOUNTER — Other Ambulatory Visit: Payer: Self-pay | Admitting: Internal Medicine

## 2019-09-07 ENCOUNTER — Telehealth: Payer: Self-pay | Admitting: Internal Medicine

## 2019-09-07 NOTE — Telephone Encounter (Signed)
Left message for patient to call back and schedule Medicare Annual Wellness Visit (AWV)   This should be a telephone visit only=30 minutes.  Last AWV 11/08/17; please schedule at anytime with Denisa O'Brien-Blaney at [facility].

## 2019-09-15 ENCOUNTER — Ambulatory Visit (INDEPENDENT_AMBULATORY_CARE_PROVIDER_SITE_OTHER): Payer: Medicare PPO

## 2019-09-15 VITALS — Ht 64.0 in | Wt 149.0 lb

## 2019-09-15 DIAGNOSIS — Z0000 Encounter for general adult medical examination without abnormal findings: Secondary | ICD-10-CM

## 2019-09-15 NOTE — Progress Notes (Addendum)
Subjective:   Laura [name] is a 86 y.o. female who presents for Medicare Annual (Subsequent) preventive examination.  Review of Systems    No ROS.  Medicare Wellness Virtual Visit.       Cardiac Risk Factors include: advanced age (>39men, >48 women);hypertension     Objective:    Today's Vitals   09/15/19 1302  Weight: 149 lb (67.6 kg)  Height: 5\' 4"  (1.626 m)   Body mass index is 25.58 kg/m.  Advanced Directives 09/15/2019 11/08/2017 06/05/2016 11/01/2014 10/10/2014  Does Patient Have a Medical Advance Directive? Yes Yes Yes Yes No  Type of 12/10/2014 of [place];Living will Healthcare Power of [place];Living will Healthcare Power of [place];Living will Living will;Healthcare Power of Attorney -  Does patient want to make changes to medical advance directive? No - Patient declined No - Patient declined No - Patient declined No - Patient declined -  Copy of Healthcare Power of Attorney in Chart? No - copy requested No - copy requested No - copy requested - -  Would patient like information on creating a medical advance directive? - - - - Yes - Educational materials given    Current Medications (verified) Outpatient Encounter Medications as of 09/15/2019  Medication Sig   ALPRAZolam (XANAX) 0.5 MG tablet Take 1 tablet (0.5 mg total) by mouth at bedtime as needed. for sleep   calcium carbonate (OS-CAL) 600 MG TABS Take 600 mg by mouth 2 (two) times daily with a meal.   Cholecalciferol (VITAMIN D3) 1000 UNITS CAPS Take one by mouth twice a day.   Coenzyme Q10 (CO Q 10) 100 MG CAPS Take 1 capsule by mouth daily.   Cyanocobalamin 2500 MCG SUBL PLACE 1 TABLET UNDER THE TONGUE DAILY AS DIRECTED   denosumab (PROLIA) 60 MG/ML SOLN injection Inject 60 mg into the skin every 6 (six) months. Administer in upper arm, thigh, or abdomen   Lutein 20 MG TABS Take one by mouth daily   magnesium oxide (MAG-OX) 400 MG tablet Take 400 mg by mouth 2 (two) times daily.    nitroGLYCERIN (NITROSTAT) 0.4 MG SL tablet Place 1 tablet (0.4 mg total) under the tongue every 5 (five) minutes as needed for chest pain.   pravastatin (PRAVACHOL) 40 MG tablet Take 1 tablet (40 mg total) by mouth daily.   Riboflavin 100 MG CAPS Take 1 capsule by mouth 2 (two) times daily.    triamterene-hydrochlorothiazide (MAXZIDE-25) 37.5-25 MG tablet TAKE 1 TABLET BY MOUTH DAILY   zolpidem (AMBIEN) 10 MG tablet TAKE 1 TABLET(10 MG) BY MOUTH AT BEDTIME   Facility-Administered Encounter Medications as of 09/15/2019  Medication   denosumab (PROLIA) injection 60 mg    Allergies (verified) Erythromycin   History: Past Medical History:  Diagnosis Date   Accidental fall 10/16/2013   Anxiety    Carotid stenosis    Left    High cholesterol    Hypertension    Pt states that her blood pressure is very well controlled   Migraine with aura    Neuropathy    Osteoporosis    Past Surgical History:  Procedure Laterality Date   ABDOMINAL HYSTERECTOMY     secondary to peritonitis and complications    APPENDECTOMY     TONSILLECTOMY     TUBAL LIGATION     at age 28, vaginal complicated by peritonitis   Family History  Adopted: Yes  Problem Relation Age of Onset   Breast cancer Mother 54   Cancer Daughter  Breast    Breast cancer Daughter [id number]   Breast cancer Sister 1165       2 times   Social History   Socioeconomic History   Marital status: Widowed    Spouse name: Not on file   Number of children: 2   Years of education: college   Highest education level: Master's degree (e.g., MA, MS, MEng, MEd, MSW, MBA)  Occupational History   Occupation: Retired  Tobacco Use   Smoking status: Former Smoker    Quit date: 1965    Years since quitting: 56.6   Smokeless tobacco: Never Used  Substance and Sexual Activity   Alcohol use: Yes    Alcohol/week: 14.0 standard drinks    Types: 14 Glasses of wine per week    Comment: 2 glasses of wine per day.   Drug use: No   Sexual  activity: Never  Other Topics Concern   Not on file  Social History Narrative   Lives at home alone.   Left-handed.   2-4 cups caffeine per day.   Social Determinants of Health   Financial Resource Strain: Low Risk    Difficulty of Paying Living Expenses: Not hard at all  Food Insecurity: No Food Insecurity   Worried About Programme researcher, broadcasting/film/videounning Out of Food in the Last Year: Never true   Ran Out of Food in the Last Year: Never true  Transportation Needs: No Transportation Needs   Lack of Transportation (Medical): No   Lack of Transportation (Non-Medical): No  Physical Activity: Sufficiently Active   Days of Exercise per Week: 5 days   Minutes of Exercise per Session: 60 min  Stress: No Stress Concern Present   Feeling of Stress : Not at all  Social Connections: Unknown   Frequency of Communication with Friends and Family: More than three times a week   Frequency of Social Gatherings with Friends and Family: More than three times a week   Attends Religious Services: Not on file   Active Member of Clubs or Organizations: Yes   Attends BankerClub or Organization Meetings: Not on file   Marital Status: Widowed    Tobacco Counseling Counseling given: Not Answered   Clinical Intake:  Pre-visit preparation completed: Yes        Diabetes: No            Activities of Daily Living In your present state of health, do you have any difficulty performing the following activities: 09/15/2019  Hearing? Y  Comment Hearing aids  Vision? N  Difficulty concentrating or making decisions? N  Walking or climbing stairs? N  Dressing or bathing? N  Doing errands, shopping? N  Preparing Food and eating ? N  Using the Toilet? N  In the past six months, have you accidently leaked urine? N  Do you have problems with loss of bowel control? N  Managing your Medications? N  Managing your Finances? N  Housekeeping or managing your Housekeeping? N  Some recent data might be hidden    Patient Care  Team: Sherlene Shamsullo, Teresa L, MD as PCP - General (Internal Medicine) Sherlene Shamsullo, Teresa L, MD (Internal Medicine)  Indicate any recent Medical Services you may have received from other than [facility] providers in the past year (date may be approximate).     Assessment:   This is a routine wellness examination for Laura [name].  I connected with Laura [name] today by telephone and verified that I am speaking with the correct person using two identifiers. Location patient: home Location provider:  work Persons participating in the virtual visit: patient, Engineer, civil (consulting).    I discussed the limitations, risks, security and privacy concerns of performing an evaluation and management service by telephone and the availability of in person appointments. The patient expressed understanding and verbally consented to this telephonic visit.    Interactive audio and video telecommunications were attempted between this provider and patient, however failed, due to patient having technical difficulties OR patient did not have access to video capability.  We continued and completed visit with audio only.  Some vital signs may be absent or patient reported.   Hearing/Vision screen  Hearing Screening   125Hz  250Hz  500Hz  1000Hz  2000Hz  3000Hz  4000Hz  6000Hz  8000Hz   Right ear:           Left ear:           Comments: Hearing aid  Vision Screening Comments: Followed by [facility] Wears corrective lenses Visual acuity not assessed, virtual visit.  They have seen their ophthalmologist in the last 12 months.     Dietary issues and exercise activities discussed: Current Exercise Habits: Home exercise routine, Type of exercise: walking, Time (Minutes): 60, Frequency (Times/Week): 5, Weekly Exercise (Minutes/Week): 300, Intensity: MildHealthy diet Good water intake Caffeine- 1 cup of coffee  Goals       Patient Stated     DIET - REDUCE SUGAR INTAKE (pt-stated)       Depression Screen PHQ 2/9 Scores 09/15/2019 06/03/2019  11/08/2017 07/30/2016 06/05/2016 12/01/2015 10/27/2014  PHQ - 2 Score 0 0 0 0 0 0 0  PHQ- 9 Score - - - - 0 - -    Fall Risk Fall Risk  09/15/2019 06/03/2019 12/17/2018 11/27/2018 11/08/2017  Falls in the past year? 0 0 0 0 No  Number falls in past yr: 0 - - - -  Comment - - - - -  Injury with Fall? - - - - -  Risk for fall due to : - - - - -  Risk for fall due to: Comment - - - - -  Follow up Falls evaluation completed Falls evaluation completed Falls evaluation completed Falls evaluation completed -   Handrails in use when climbing stairs? Yes  Home free of loose throw rugs in walkways, pet beds, electrical cords, etc? Yes  Adequate lighting in your home to reduce risk of falls? Yes   ASSISTIVE DEVICES UTILIZED TO PREVENT FALLS: Use of a cane, walker or w/c? No   TIMED UP AND GO:  Was the test performed? No . Virtual visit.   Cognitive Function: MMSE - Mini Mental State Exam 11/08/2017 06/05/2016  Orientation to time 5 5  Orientation to Place 5 5  Registration 3 3  Attention/ Calculation 5 5  Recall 3 3  Language- name 2 objects 2 2  Language- repeat 1 1  Language- follow 3 step command 3 3  Language- read & follow direction 1 1  Write a sentence 1 1  Copy design 1 1  Total score 30 30        Immunizations Immunization History  Administered Date(s) Administered   Fluad Quad(high Dose 65+) 10/10/2018   Influenza, High Dose Seasonal PF 10/27/2014, 10/05/2015, 10/24/2016, 10/30/2017   Influenza,inj,Quad PF,6+ Mos 10/17/2012, 10/30/2013   PFIZER SARS-COV-2 Vaccination 02/05/2019, 02/26/2019   Pneumococcal Conjugate-13 10/30/2013   Pneumococcal Polysaccharide-23 12/30/2010   Tdap 02/19/2013   Zoster 01/01/2008   Zoster Recombinat (Shingrix) 05/14/2017, 07/31/2017    Health Maintenance Health Maintenance  Topic Date Due  INFLUENZA VACCINE  12/16/2019 (Originally 08/30/2019)   TETANUS/TDAP  02/20/2023   DEXA SCAN  Completed   COVID-19 Vaccine  Completed   PNA vac Low  Risk Adult  Completed   Influenza vaccine- out of stock.  Dental Screening: Recommended annual dental exams for proper oral hygiene. Visits every 6 months.   Community Resource Referral / Chronic Care Management: CRR required this visit?  No   CCM required this visit?  No      Plan:   Keep all routine maintenance appointments.   Patient plans to schedule lab appointment and follow up with PMD soon.   I have personally reviewed and noted the following in the patient's chart:   Medical and social history Use of alcohol, tobacco or illicit drugs  Current medications and supplements Functional ability and status Nutritional status Physical activity Advanced directives List of other physicians Hospitalizations, surgeries, and ER visits in previous 12 months Vitals Screenings to include cognitive, depression, and falls Referrals and appointments  In addition, I have reviewed and discussed with patient certain preventive protocols, quality metrics, and best practice recommendations. A written personalized care plan for preventive services as well as general preventive health recommendations were provided to patient via mychart.     OBrien-Blaney, Helmer Dull L, LPN   [id number]     I have reviewed the above information and agree with above.   Duncan Dull, MD

## 2019-09-15 NOTE — Patient Instructions (Addendum)
[name] , Thank you for taking time to come for your Medicare Wellness Visit. I appreciate your ongoing commitment to your health goals. Please review the following plan we discussed and let me know if I can assist you in the future.   These are the goals we discussed: Goals      Patient Stated   .  DIET - REDUCE SUGAR INTAKE (pt-stated)       This is a list of the screening recommended for you and due dates:  Health Maintenance  Topic Date Due  . Flu Shot  12/16/2019*  . Tetanus Vaccine  02/20/2023  . DEXA scan (bone density measurement)  Completed  . COVID-19 Vaccine  Completed  . Pneumonia vaccines  Completed  *Topic was postponed. The date shown is not the original due date.    Immunizations Immunization History  Administered Date(s) Administered  . Fluad Quad(high Dose 65+) 10/10/2018  . Influenza, High Dose Seasonal PF 10/27/2014, 10/05/2015, 10/24/2016, 10/30/2017  . Influenza,inj,Quad PF,6+ Mos 10/17/2012, 10/30/2013  . PFIZER SARS-COV-2 Vaccination 02/05/2019, 02/26/2019  . Pneumococcal Conjugate-13 10/30/2013  . Pneumococcal Polysaccharide-23 12/30/2010  . Tdap 02/19/2013  . Zoster 01/01/2008  . Zoster Recombinat (Shingrix) 05/14/2017, 07/31/2017   Advanced directives: End of life planning; Advance aging; Advanced directives discussed.  Copy of current HCPOA/Living Will requested.    Conditions/risks identified: none new.  Follow up in one year for your annual wellness visit.   Preventive Care 84 Years and Older, Female Preventive care refers to lifestyle choices and visits with your health care provider that can promote health and wellness. What does preventive care include?  A yearly physical exam. This is also called an annual well check.  Dental exams once or twice a year.  Routine eye exams. Ask your health care provider how often you should have your eyes checked.  Personal lifestyle choices, including:  Daily care of your teeth and  gums.  Regular physical activity.  Eating a healthy diet.  Avoiding tobacco and drug use.  Limiting alcohol use.  Practicing safe sex.  Taking low-dose aspirin every day.  Taking vitamin and mineral supplements as recommended by your health care provider. What happens during an annual well check? The services and screenings done by your health care provider during your annual well check will depend on your age, overall health, lifestyle risk factors, and family history of disease. Counseling  Your health care provider may ask you questions about your:  Alcohol use.  Tobacco use.  Drug use.  Emotional well-being.  Home and relationship well-being.  Sexual activity.  Eating habits.  History of falls.  Memory and ability to understand (cognition).  Work and work Astronomer.  Reproductive health. Screening  You may have the following tests or measurements:  Height, weight, and BMI.  Blood pressure.  Lipid and cholesterol levels. These may be checked every 5 years, or more frequently if you are over 22 years old.  Skin check.  Lung cancer screening. You may have this screening every year starting at age 6 if you have a 30-pack-year history of smoking and currently smoke or have quit within the past 15 years.  Fecal occult blood test (FOBT) of the stool. You may have this test every year starting at age 42.  Flexible sigmoidoscopy or colonoscopy. You may have a sigmoidoscopy every 5 years or a colonoscopy every 10 years starting at age 37.  Hepatitis C blood test.  Hepatitis B blood test.  Sexually transmitted disease (STD) testing.  Diabetes screening. This is done by checking your blood sugar (glucose) after you have not eaten for a while (fasting). You may have this done every 1-3 years.  Bone density scan. This is done to screen for osteoporosis. You may have this done starting at age 65.  Mammogram. This may be done every 1-2 years. Talk to your  health care provider about how often you should have regular mammograms. Talk with your health care provider about your test results, treatment options, and if necessary, the need for more tests. Vaccines  Your health care provider may recommend certain vaccines, such as:  Influenza vaccine. This is recommended every year.  Tetanus, diphtheria, and acellular pertussis (Tdap, Td) vaccine. You may need a Td booster every 10 years.  Zoster vaccine. You may need this after age 72.  Pneumococcal 13-valent conjugate (PCV13) vaccine. One dose is recommended after age 18.  Pneumococcal polysaccharide (PPSV23) vaccine. One dose is recommended after age 76. Talk to your health care provider about which screenings and vaccines you need and how often you need them. This information is not intended to replace advice given to you by your health care provider. Make sure you discuss any questions you have with your health care provider. Document Released: 02/11/2015 Document Revised: 10/05/2015 Document Reviewed: 11/16/2014 Elsevier Interactive Patient Education  2017 [employer] Prevention in the Home Falls can cause injuries. They can happen to people of all ages. There are many things you can do to make your home safe and to help prevent falls. What can I do on the outside of my home?  Regularly fix the edges of walkways and driveways and fix any cracks.  Remove anything that might make you trip as you walk through a door, such as a raised step or threshold.  Trim any bushes or trees on the path to your home.  Use bright outdoor lighting.  Clear any walking paths of anything that might make someone trip, such as rocks or tools.  Regularly check to see if handrails are loose or broken. Make sure that both sides of any steps have handrails.  Any raised decks and porches should have guardrails on the edges.  Have any leaves, snow, or ice cleared regularly.  Use sand or salt on walking  paths during winter.  Clean up any spills in your garage right away. This includes oil or grease spills. What can I do in the bathroom?  Use night lights.  Install grab bars by the toilet and in the tub and shower. Do not use towel bars as grab bars.  Use non-skid mats or decals in the tub or shower.  If you need to sit down in the shower, use a plastic, non-slip stool.  Keep the floor dry. Clean up any water that spills on the floor as soon as it happens.  Remove soap buildup in the tub or shower regularly.  Attach bath mats securely with double-sided non-slip rug tape.  Do not have throw rugs and other things on the floor that can make you trip. What can I do in the bedroom?  Use night lights.  Make sure that you have a light by your bed that is easy to reach.  Do not use any sheets or blankets that are too big for your bed. They should not hang down onto the floor.  Have a firm chair that has side arms. You can use this for support while you get dressed.  Do not have throw  rugs and other things on the floor that can make you trip. What can I do in the kitchen?  Clean up any spills right away.  Avoid walking on wet floors.  Keep items that you use a lot in easy-to-reach places.  If you need to reach something above you, use a strong step stool that has a grab bar.  Keep electrical cords out of the way.  Do not use floor polish or wax that makes floors slippery. If you must use wax, use non-skid floor wax.  Do not have throw rugs and other things on the floor that can make you trip. What can I do with my stairs?  Do not leave any items on the stairs.  Make sure that there are handrails on both sides of the stairs and use them. Fix handrails that are broken or loose. Make sure that handrails are as long as the stairways.  Check any carpeting to make sure that it is firmly attached to the stairs. Fix any carpet that is loose or worn.  Avoid having throw rugs at  the top or bottom of the stairs. If you do have throw rugs, attach them to the floor with carpet tape.  Make sure that you have a light switch at the top of the stairs and the bottom of the stairs. If you do not have them, ask someone to add them for you. What else can I do to help prevent falls?  Wear shoes that:  Do not have high heels.  Have rubber bottoms.  Are comfortable and fit you well.  Are closed at the toe. Do not wear sandals.  If you use a stepladder:  Make sure that it is fully opened. Do not climb a closed stepladder.  Make sure that both sides of the stepladder are locked into place.  Ask someone to hold it for you, if possible.  Clearly mark and make sure that you can see:  Any grab bars or handrails.  First and last steps.  Where the edge of each step is.  Use tools that help you move around (mobility aids) if they are needed. These include:  Canes.  Walkers.  Scooters.  Crutches.  Turn on the lights when you go into a dark area. Replace any light bulbs as soon as they burn out.  Set up your furniture so you have a clear path. Avoid moving your furniture around.  If any of your floors are uneven, fix them.  If there are any pets around you, be aware of where they are.  Review your medicines with your doctor. Some medicines can make you feel dizzy. This can increase your chance of falling. Ask your doctor what other things that you can do to help prevent falls. This information is not intended to replace advice given to you by your health care provider. Make sure you discuss any questions you have with your health care provider. Document Released: 11/11/2008 Document Revised: 06/23/2015 Document Reviewed: 02/19/2014 Elsevier Interactive Patient Education  2017 [employer].

## 2019-09-25 ENCOUNTER — Encounter: Payer: Self-pay | Admitting: Internal Medicine

## 2019-09-25 ENCOUNTER — Ambulatory Visit (INDEPENDENT_AMBULATORY_CARE_PROVIDER_SITE_OTHER): Payer: Medicare PPO

## 2019-09-25 ENCOUNTER — Ambulatory Visit: Payer: Medicare PPO | Admitting: Internal Medicine

## 2019-09-25 ENCOUNTER — Other Ambulatory Visit: Payer: Self-pay

## 2019-09-25 VITALS — BP 130/68 | HR 88 | Temp 98.7°F | Resp 15 | Ht 64.0 in | Wt 151.0 lb

## 2019-09-25 DIAGNOSIS — S4991XA Unspecified injury of right shoulder and upper arm, initial encounter: Secondary | ICD-10-CM | POA: Diagnosis not present

## 2019-09-25 DIAGNOSIS — S2231XA Fracture of one rib, right side, initial encounter for closed fracture: Secondary | ICD-10-CM | POA: Diagnosis not present

## 2019-09-25 DIAGNOSIS — M898X1 Other specified disorders of bone, shoulder: Secondary | ICD-10-CM

## 2019-09-25 MED ORDER — TRAMADOL HCL 50 MG PO TABS: 50.0000 mg | ORAL_TABLET | Freq: Three times a day (TID) | ORAL | 0 refills | Status: AC | PRN

## 2019-09-25 NOTE — Assessment & Plan Note (Signed)
Secondary to blunt trauma. Plain films ordered given osteoporosis.

## 2019-09-25 NOTE — Progress Notes (Signed)
Subjective:  Patient ID: Drinda ButtsBettie J Gatchalian, female    DOB: 1932-08-19  Age: 86 y.o. MRN: [id number]  CC: The primary encounter diagnosis was Pain of right scapula. A diagnosis of Closed fracture of one rib of right side, initial encounter was also pertinent to this visit.  HPI Garvin FilaBettie J Fringer presents for evaluation of tight shoulder pain that has been present for the past 2 days   This visit occurred during the SARS-CoV-2 public health emergency.  Safety protocols were in place, including screening questions prior to the visit, additional usage of staff PPE, and extensive cleaning of exam room while observing appropriate contact time as indicated for disinfecting solutions.   B is an 86 yr old female with osteoporosis, managed with Prolia, who presents with 48 hour history of severe pain over the right upper  posterolateral thorax after falling at home.  She struck her shoulder blade against the edge of a door frame during a trip and fall at home .  The fall was caused by sudden fecal urgency and she was running to the bathroom and bounced off one side of the doorway and hit the other.  She denies any bruising and is able to raise her arm full both in flexion and abduction . However internal rotation is painful.  She denies numbness or tingling in the hand , has no neck pain , and no pain radiating to the arm.     Outpatient Medications Prior to Visit  Medication Sig Dispense Refill  . ALPRAZolam (XANAX) 0.5 MG tablet Take 1 tablet (0.5 mg total) by mouth at bedtime as needed. for sleep 30 tablet 5  . calcium carbonate (OS-CAL) 600 MG TABS Take 600 mg by mouth 2 (two) times daily with a meal.    . Cholecalciferol (VITAMIN D3) 1000 UNITS CAPS Take one by mouth twice a day.    . Coenzyme Q10 (CO Q 10) 100 MG CAPS Take 1 capsule by mouth daily.    . Cyanocobalamin 2500 MCG SUBL PLACE 1 TABLET UNDER THE TONGUE DAILY AS DIRECTED 90 tablet 1  . denosumab (PROLIA) 60 MG/ML SOLN injection Inject 60 mg  into the skin every 6 (six) months. Administer in upper arm, thigh, or abdomen    . magnesium oxide (MAG-OX) 400 MG tablet Take 400 mg by mouth 2 (two) times daily.    . Multiple Vitamins-Minerals (PRESERVISION AREDS 2 PO) Take 1 capsule by mouth daily.    . nitroGLYCERIN (NITROSTAT) 0.4 MG SL tablet Place 1 tablet (0.4 mg total) under the tongue every 5 (five) minutes as needed for chest pain. 50 tablet 3  . pravastatin (PRAVACHOL) 40 MG tablet Take 1 tablet (40 mg total) by mouth daily. 90 tablet 3  . Riboflavin 100 MG CAPS Take 1 capsule by mouth 2 (two) times daily.     Marland Kitchen. triamterene-hydrochlorothiazide (MAXZIDE-25) 37.5-25 MG tablet TAKE 1 TABLET BY MOUTH DAILY 90 tablet 1  . zolpidem (AMBIEN) 10 MG tablet TAKE 1 TABLET(10 MG) BY MOUTH AT BEDTIME 30 tablet 5  . Lutein 20 MG TABS Take one by mouth daily (Patient not taking: Reported on 09/25/2019)     Facility-Administered Medications Prior to Visit  Medication Dose Route Frequency Provider Last Rate Last Admin  . denosumab (PROLIA) injection 60 mg  60 mg Subcutaneous Once Sherlene Shamsullo, Dorisann Schwanke L, MD        Review of Systems;  Patient denies headache, fevers, malaise, unintentional weight loss, skin rash, eye pain, sinus congestion and sinus  pain, sore throat, dysphagia,  hemoptysis , cough, dyspnea, wheezing, chest pain, palpitations, orthopnea, edema, abdominal pain, nausea, melena, diarrhea, constipation, flank pain, dysuria, hematuria, urinary  Frequency, nocturia, numbness, tingling, seizures,  Focal weakness, Loss of consciousness,  Tremor, insomnia, depression, anxiety, and suicidal ideation.      Objective:  BP 130/68 (BP Location: Left Arm, Patient Position: Sitting, Cuff Size: Normal)   Pulse 88   Temp 98.7 F (37.1 C) (Oral)   Resp 15   Ht 5\' 4"  (1.626 m)   Wt 151 lb (68.5 kg)   SpO2 97%   BMI 25.92 kg/m   BP Readings from Last 3 Encounters:  09/25/19 130/68  06/03/19 116/72  12/31/18 131/72    Wt Readings from Last 3  Encounters:  09/25/19 151 lb (68.5 kg)  09/15/19 149 lb (67.6 kg)  06/03/19 149 lb 3.2 oz (67.7 kg)    General appearance: alert, cooperative and appears stated age Ears: normal TM's and external ear canals both ears Throat: lips, mucosa, and tongue normal; teeth and gums normal Neck: no adenopathy, no carotid bruit, supple, symmetrical, trachea midline and thyroid not enlarged, symmetric, no tenderness/mass/nodules Back: symmetric, no curvature. ROM normal.  Tender over the right lateral scapula /rib cage. No CVA tenderness. Lungs: clear to auscultation bilaterally Heart: regular rate and rhythm, S1, S2 normal, no murmur, click, rub or gallop Abdomen: soft, non-tender; bowel sounds normal; no masses,  no organomegaly Pulses: 2+ and symmetric Skin: Skin color, texture, turgor normal. No rashes or lesions Lymph nodes: Cervical, supraclavicular, and axillary nodes normal.  Lab Results  Component Value Date   HGBA1C 5.6 02/10/2019   HGBA1C 5.4 05/05/2014    Lab Results  Component Value Date   CREATININE 0.75 02/10/2019   CREATININE 0.70 12/01/2018   CREATININE 0.75 07/02/2018    Lab Results  Component Value Date   WBC 6.1 07/02/2018   HGB 14.1 07/02/2018   HCT 40.9 07/02/2018   PLT 270.0 07/02/2018   GLUCOSE 109 (H) 02/10/2019   CHOL 205 (H) 02/10/2019   TRIG 79.0 02/10/2019   HDL 88.90 02/10/2019   LDLDIRECT 109.7 09/27/2011   LDLCALC 100 (H) 02/10/2019   ALT 16 02/10/2019   AST 18 02/10/2019   NA 137 02/10/2019   K 4.1 02/10/2019   CL 101 02/10/2019   CREATININE 0.75 02/10/2019   BUN 14 02/10/2019   CO2 29 02/10/2019   TSH 3.05 07/02/2018   HGBA1C 5.6 02/10/2019    DG Bone Density  Result Date: 03/05/2019 EXAM: DUAL X-RAY ABSORPTIOMETRY (DXA) FOR BONE MINERAL DENSITY IMPRESSION: Your patient Bruna Sherrard completed a BMD test on 03/05/2019 using the 05/03/2019 Advance DXA System (analysis version: 14.10) manufactured by [employer]. The following summarizes  the results of our evaluation. PATIENT BIOGRAPHICAL: Name: Misao, Fackrell Patient ID: [name] Birth Date: [birth year] Height: 63.5 in. Gender: Female Exam Date: 03/05/2019 Weight: 149.2 lbs. Indications: Advanced Age, Bilateral Ovariectomy, Caucasian, Height Loss, History of Fracture (Adult), Hysterectomy, Osteoporotic, POSTmenopausal Fractures: lt hand, rt foot Treatments: Calcium, prolia, Vitamin D ASSESSMENT: The BMD measured at Femur Neck Left is 0.779 g/cm2 with a T-score of -1.9. This patient is considered osteopenic according to World Health Organization [facility]) criteria. L-2 &4 were excluded due to degenerative changes. The scan quality is limited by exclusion of L-2&4. Patient is not a candidate for FRAX assessment due to Prolia use. Site Region Measured Measured WHO Young Adult BMD Date       Age  Classification T-score AP Spine L1-L3 (L2) 5/05/2019 84.3 Osteopenia -1.1 1.031 g/cm2 DualFemur Neck Left 5/05/2019 84.3 Osteopenia -1.9 0.779 g/cm2 World Health Organization [facility]) criteria for post-menopausal, Caucasian Women: Normal:       T-score at or above -1 SD Osteopenia:   T-score between -1 and -2.5 SD Osteoporosis: T-score at or below -2.5 SD RECOMMENDATIONS: 1. All patients should optimize calcium and vitamin D intake. 2. Consider FDA-approved medical therapies in postmenopausal women and men aged 58 years and older, based on the following: a. A hip or vertebral (clinical or morphometric) fracture b. T-score < -2.5 at the femoral neck or spine after appropriate evaluation to exclude secondary causes c. Low bone mass (T-score between -1.0 and -2.5 at the femoral neck or spine) and a 10-year probability of a hip fracture > 3% or a 10-year probability of a major osteoporosis-related fracture > 20% based on the [place]-adapted WHO algorithm d. Clinician judgment and/or patient preferences may indicate treatment for people with 10-year fracture probabilities above or below these levels FOLLOW-UP: People  with diagnosed cases of osteoporosis or at high risk for fracture should have regular bone mineral density tests. For patients eligible for Medicare, routine testing is allowed once every 2 years. The testing frequency can be increased to one year for patients who have rapidly progressing disease, those who are receiving or discontinuing medical therapy to restore bone mass, or have additional risk factors. Electronically Signed   By: Danae Orleans M.D.   On: 03/05/2019 09:04   MM 3D SCREEN BREAST BILATERAL  Result Date: 03/05/2019 CLINICAL DATA:  Screening. EXAM: DIGITAL SCREENING BILATERAL MAMMOGRAM WITH TOMO AND CAD COMPARISON:  Previous exam(s). ACR Breast Density Category b: There are scattered areas of fibroglandular density. FINDINGS: There are no findings suspicious for malignancy. Images were processed with CAD. IMPRESSION: No mammographic evidence of malignancy. A result letter of this screening mammogram will be mailed directly to the patient. RECOMMENDATION: Screening mammogram in one year. (Code:SM-B-01Y) BI-RADS CATEGORY  1: Negative. Electronically Signed   By: Harmon Pier M.D.   On: 03/05/2019 12:36    Assessment & Plan:   Problem List Items Addressed This Visit      Unprioritized   Fracture of rib    Suggested by scapular films done today .   Pain management.  Repeat films in 8 weeks       Pain of right scapula - Primary    Secondary to blunt trauma. Plain films ordered given osteoporosis.       Relevant Orders   DG Scapula Right (Completed)      I provided  30 minutes of  face-to-face time during this encounter reviewing patient's current problems and past surgeries, labs and imaging studies, providing counseling on the above mentioned problems , and coordination  of care .  I have discontinued Gabbrielle J. Bagnell "B Pavao"'s Lutein. I am also having her start on traMADol. Additionally, I am having her maintain her calcium carbonate, Vitamin D3, Riboflavin, Co Q 10,  denosumab, magnesium oxide, nitroGLYCERIN, Cyanocobalamin, ALPRAZolam, pravastatin, zolpidem, triamterene-hydrochlorothiazide, and Multiple Vitamins-Minerals (PRESERVISION AREDS 2 PO). We will continue to administer denosumab.  Meds ordered this encounter  Medications  . traMADol (ULTRAM) 50 MG tablet    Sig: Take 1 tablet (50 mg total) by mouth every 8 (eight) hours as needed for up to 5 days.    Dispense:  15 tablet    Refill:  0    Medications Discontinued During This Encounter  Medication Reason  .  Lutein 20 MG TABS     Follow-up: No follow-ups on file.   Sherlene Shams, MD

## 2019-09-25 NOTE — Patient Instructions (Addendum)
  For your pain:  600 mg ibuprofen every 8 hours  1000 mg tylenol ever 8 hours  Add tramadol with or in between If still in severe pain  Ice alternating with heat 15 miinutes  each 3 times daily    Don't let the shoulder get frozen.  Do the exercises:  Walk your fingers up the wall Let arm hand down and make gradually enlarging circles

## 2019-09-26 DIAGNOSIS — S2239XA Fracture of one rib, unspecified side, initial encounter for closed fracture: Secondary | ICD-10-CM | POA: Insufficient documentation

## 2019-09-26 HISTORY — DX: Fracture of one rib, unspecified side, initial encounter for closed fracture: S22.39XA

## 2019-09-26 NOTE — Assessment & Plan Note (Signed)
Suggested by scapular films done today .   Pain management.  Repeat films in 8 weeks

## 2019-10-07 DIAGNOSIS — D2272 Melanocytic nevi of left lower limb, including hip: Secondary | ICD-10-CM | POA: Diagnosis not present

## 2019-10-07 DIAGNOSIS — L821 Other seborrheic keratosis: Secondary | ICD-10-CM | POA: Diagnosis not present

## 2019-10-07 DIAGNOSIS — D2262 Melanocytic nevi of left upper limb, including shoulder: Secondary | ICD-10-CM | POA: Diagnosis not present

## 2019-10-07 DIAGNOSIS — D2261 Melanocytic nevi of right upper limb, including shoulder: Secondary | ICD-10-CM | POA: Diagnosis not present

## 2019-10-07 DIAGNOSIS — D225 Melanocytic nevi of trunk: Secondary | ICD-10-CM | POA: Diagnosis not present

## 2019-10-07 DIAGNOSIS — D2271 Melanocytic nevi of right lower limb, including hip: Secondary | ICD-10-CM | POA: Diagnosis not present

## 2019-11-04 ENCOUNTER — Other Ambulatory Visit (INDEPENDENT_AMBULATORY_CARE_PROVIDER_SITE_OTHER): Payer: Medicare PPO

## 2019-11-04 ENCOUNTER — Other Ambulatory Visit: Payer: Self-pay

## 2019-11-04 DIAGNOSIS — Z79899 Other long term (current) drug therapy: Secondary | ICD-10-CM | POA: Diagnosis not present

## 2019-11-04 DIAGNOSIS — E559 Vitamin D deficiency, unspecified: Secondary | ICD-10-CM

## 2019-11-04 DIAGNOSIS — Z23 Encounter for immunization: Secondary | ICD-10-CM

## 2019-11-04 LAB — COMPREHENSIVE METABOLIC PANEL
ALT: 15 U/L (ref 0–35)
AST: 15 U/L (ref 0–37)
Albumin: 4.3 g/dL (ref 3.5–5.2)
Alkaline Phosphatase: 51 U/L (ref 39–117)
BUN: 15 mg/dL (ref 6–23)
CO2: 29 mEq/L (ref 19–32)
Calcium: 9.5 mg/dL (ref 8.4–10.5)
Chloride: 100 mEq/L (ref 96–112)
Creatinine, Ser: 0.75 mg/dL (ref 0.40–1.20)
GFR: 72.69 mL/min (ref >60.00)
Glucose, Bld: 93 mg/dL (ref 70–99)
Potassium: 4.2 mEq/L (ref 3.5–5.1)
Sodium: 136 mEq/L (ref 135–145)
Total Bilirubin: 0.4 mg/dL (ref 0.2–1.2)
Total Protein: 6.7 g/dL (ref 6.0–8.3)

## 2019-11-04 LAB — VITAMIN D 25 HYDROXY (VIT D DEFICIENCY, FRACTURES): VITD: 29.6 ng/mL — ABNORMAL LOW (ref 30.00–100.00)

## 2019-11-04 NOTE — Progress Notes (Signed)
Your vitamin D is borderline low.  If you are not taking a D3 supplement,  please start taking 1000 [place] daily.  If you are ,  Please increase to 2000 IUs daily. L  Other labs are normal

## 2019-11-09 ENCOUNTER — Telehealth: Payer: Self-pay | Admitting: Internal Medicine

## 2019-11-09 ENCOUNTER — Encounter: Payer: Self-pay | Admitting: *Deleted

## 2019-11-09 NOTE — Telephone Encounter (Signed)
Patient has an appointment with PCP on 12/04/19 ok to give Prolia at visit.

## 2019-12-04 ENCOUNTER — Encounter: Payer: Self-pay | Admitting: Internal Medicine

## 2019-12-04 ENCOUNTER — Other Ambulatory Visit: Payer: Self-pay

## 2019-12-04 ENCOUNTER — Ambulatory Visit: Payer: Medicare PPO | Admitting: Internal Medicine

## 2019-12-04 VITALS — BP 118/66 | HR 77 | Temp 98.5°F | Resp 15 | Ht 64.0 in | Wt 150.0 lb

## 2019-12-04 DIAGNOSIS — M810 Age-related osteoporosis without current pathological fracture: Secondary | ICD-10-CM | POA: Diagnosis not present

## 2019-12-04 DIAGNOSIS — M1611 Unilateral primary osteoarthritis, right hip: Secondary | ICD-10-CM | POA: Diagnosis not present

## 2019-12-04 DIAGNOSIS — S2231XA Fracture of one rib, right side, initial encounter for closed fracture: Secondary | ICD-10-CM

## 2019-12-04 DIAGNOSIS — E785 Hyperlipidemia, unspecified: Secondary | ICD-10-CM | POA: Diagnosis not present

## 2019-12-04 DIAGNOSIS — G4700 Insomnia, unspecified: Secondary | ICD-10-CM | POA: Diagnosis not present

## 2019-12-04 DIAGNOSIS — I10 Essential (primary) hypertension: Secondary | ICD-10-CM

## 2019-12-04 MED ORDER — DENOSUMAB 60 MG/ML ~~LOC~~ SOSY
60.0000 mg | PREFILLED_SYRINGE | Freq: Once | SUBCUTANEOUS | Status: AC
  Administered 2019-12-04: 60 mg via SUBCUTANEOUS

## 2019-12-04 MED ORDER — ZOLPIDEM TARTRATE 10 MG PO TABS
ORAL_TABLET | ORAL | 5 refills | Status: DC
Start: 2019-12-04 — End: 2020-06-02

## 2019-12-04 NOTE — Patient Instructions (Addendum)
Try the Beet juice blended drink (it used to be called "Beet me)  at the Blend in [place]  FOR ENERGY  STOP THE DAILY ADVIL   You can take to 2000 mg of acetominophen (tylenol) every day safely  In divided doses (500 mg every 6 hours  Or 1000 mg every 12 hours.)  Salon Pas patch with 4% lidocaine  On your ischial tuberosity (butt bone)   I have refilled your Ambien!     YOU RECEIVED YOUR PROLIA INJECTION TODAY

## 2019-12-04 NOTE — Progress Notes (Signed)
Subjective:  Patient ID: Laura [name], female    DOB: [birth year]  Age: 86 y.o. MRN: [id number]  CC: The primary encounter diagnosis was Hyperlipidemia with target LDL less than 130. Diagnoses of Age-related osteoporosis without current pathological fracture, Closed fracture of one rib of right side, initial encounter, Primary hypertension, Insomnia, unspecified type, and Osteoarthritis of right hip, unspecified osteoarthritis type were also pertinent to this visit.  HPI Laura [name] presents for follow upon hypertension, osteoporosis , hyperlipidemia and anxiety/insomnia    This visit occurred during the SARS-CoV-2 public health emergency.  Safety protocols were in place, including screening questions prior to the visit, additional usage of staff PPE, and extensive cleaning of exam room while observing appropriate contact time as indicated for disinfecting solutions.    Patient has received both doses of the available COVID 19 vaccine without complications.  Patient continues to mask when outside of the home except when walking in yard or at safe distances from others .  Patient denies any change in mood or development of unhealthy behaviors resuting from the pandemic's restriction of activities and socialization.    Osteoporosis:  With prior rib and humeral fracture..   Due for Prolia injection today.  Denies any recent falls or fractures since last August. . Last seen in august after a fall with scapular pain investigated and 3rd rib fracture suggested. Rib pain has resolved.  Taking 2000 ius of D3 daily   Right shoulder still tender.   Has resumed doing yoga.  Notes that her  RoM limited only by pain with internal rotation  Chronic intermittent Right ischial tuberosity and lateral hip pain aggravated by prolonged  standing and walking . Not present with waking .   Takes   2 advil and  5 mg ambein at night.   ambien dependent.  Requesting refills   Hypertension: patient checks blood  pressure twice weekly at home.  Readings have been for the most part < 140/80 at rest . Patient is following a reduced salt diet most days and is taking medications as prescribed    Outpatient Medications Prior to Visit  Medication Sig Dispense Refill  . calcium carbonate (OS-CAL) 600 MG TABS Take 600 mg by mouth 2 (two) times daily with a meal.    . Cholecalciferol (VITAMIN D3) 50 MCG (2000 UT) CAPS Take 1 capsule by mouth daily.    . Coenzyme Q10 (CO Q 10) 100 MG CAPS Take 1 capsule by mouth daily.    . Cyanocobalamin 2500 MCG SUBL PLACE 1 TABLET UNDER THE TONGUE DAILY AS DIRECTED 90 tablet 1  . denosumab (PROLIA) 60 MG/ML SOLN injection Inject 60 mg into the skin every 6 (six) months. Administer in upper arm, thigh, or abdomen    . magnesium oxide (MAG-OX) 400 MG tablet Take 400 mg by mouth 2 (two) times daily.    . Multiple Vitamins-Minerals (PRESERVISION AREDS 2 PO) Take 1 capsule by mouth daily.    . nitroGLYCERIN (NITROSTAT) 0.4 MG SL tablet Place 1 tablet (0.4 mg total) under the tongue every 5 (five) minutes as needed for chest pain. 50 tablet 3  . pravastatin (PRAVACHOL) 40 MG tablet Take 1 tablet (40 mg total) by mouth daily. 90 tablet 3  . Riboflavin 100 MG CAPS Take 1 capsule by mouth 2 (two) times daily.     Marland Kitchen triamterene-hydrochlorothiazide (MAXZIDE-25) 37.5-25 MG tablet TAKE 1 TABLET BY MOUTH DAILY 90 tablet 1  . ALPRAZolam (XANAX) 0.5 MG tablet Take 1 tablet (0.5  mg total) by mouth at bedtime as needed. for sleep 30 tablet 5  . zolpidem (AMBIEN) 10 MG tablet TAKE 1 TABLET(10 MG) BY MOUTH AT BEDTIME 30 tablet 5  . Cholecalciferol (VITAMIN D3) 1000 UNITS CAPS Take one by mouth twice a day. (Patient not taking: Reported on 12/04/2019)     Facility-Administered Medications Prior to Visit  Medication Dose Route Frequency Provider Last Rate Last Admin  . denosumab (PROLIA) injection 60 mg  60 mg Subcutaneous Once Sherlene Shamsullo, Daronte Shostak L, MD        Review of Systems;  Patient denies  headache, fevers, malaise, unintentional weight loss, skin rash, eye pain, sinus congestion and sinus pain, sore throat, dysphagia,  hemoptysis , cough, dyspnea, wheezing, chest pain, palpitations, orthopnea, edema, abdominal pain, nausea, melena, diarrhea, constipation, flank pain, dysuria, hematuria, urinary  Frequency, nocturia, numbness, tingling, seizures,  Focal weakness, Loss of consciousness,  Tremor, insomnia, depression, anxiety, and suicidal ideation.      Objective:  BP 118/66 (BP Location: Left Arm, Patient Position: Sitting, Cuff Size: Normal)   Pulse 77   Temp 98.5 F (36.9 C) (Oral)   Resp 15   Ht 5\' 4"  (1.626 m)   Wt 150 lb (68 kg)   SpO2 97%   BMI 25.75 kg/m   BP Readings from Last 3 Encounters:  12/04/19 118/66  09/25/19 130/68  06/03/19 116/72    Wt Readings from Last 3 Encounters:  12/04/19 150 lb (68 kg)  09/25/19 151 lb (68.5 kg)  09/15/19 149 lb (67.6 kg)    General appearance: alert, cooperative and appears stated age Ears: normal TM's and external ear canals both ears Throat: lips, mucosa, and tongue normal; teeth and gums normal Neck: no adenopathy, no carotid bruit, supple, symmetrical, trachea midline and thyroid not enlarged, symmetric, no tenderness/mass/nodules Back: symmetric, no curvature. ROM normal. No CVA tenderness. Lungs: clear to auscultation bilaterally Heart: regular rate and rhythm, S1, S2 normal, no murmur, click, rub or gallop Abdomen: soft, non-tender; bowel sounds normal; no masses,  no organomegaly Pulses: 2+ and symmetric Skin: Skin color, texture, turgor normal. No rashes or lesions Lymph nodes: Cervical, supraclavicular, and axillary nodes normal.  Lab Results  Component Value Date   HGBA1C 5.6 02/10/2019   HGBA1C 5.4 05/05/2014    Lab Results  Component Value Date   CREATININE 0.75 11/04/2019   CREATININE 0.75 02/10/2019   CREATININE 0.70 12/01/2018    Lab Results  Component Value Date   WBC 6.1 07/02/2018    HGB 14.1 07/02/2018   HCT 40.9 07/02/2018   PLT 270.0 07/02/2018   GLUCOSE 93 11/04/2019   CHOL 205 (H) 02/10/2019   TRIG 79.0 02/10/2019   HDL 88.90 02/10/2019   LDLDIRECT 109.7 09/27/2011   LDLCALC 100 (H) 02/10/2019   ALT 15 11/04/2019   AST 15 11/04/2019   NA 136 11/04/2019   K 4.2 11/04/2019   CL 100 11/04/2019   CREATININE 0.75 11/04/2019   BUN 15 11/04/2019   CO2 29 11/04/2019   TSH 3.05 07/02/2018   HGBA1C 5.6 02/10/2019    DG Bone Density  Result Date: 03/05/2019 EXAM: DUAL X-RAY ABSORPTIOMETRY (DXA) FOR BONE MINERAL DENSITY IMPRESSION: Your patient Laura [name] completed a BMD test on 03/05/2019 using the [employer] Advance DXA System (analysis version: 14.10) manufactured by [employer]. The following summarizes the results of our evaluation. PATIENT BIOGRAPHICAL: Name: Laura [name], Laura [name] Patient ID: [id number] Birth Date: 10-28-1932 Height: 63.5 in. Gender: Female Exam Date: 03/05/2019 Weight: 149.2 lbs. Indications:  Advanced Age, Bilateral Ovariectomy, Caucasian, Height Loss, History of Fracture (Adult), Hysterectomy, Osteoporotic, POSTmenopausal Fractures: lt hand, rt foot Treatments: Calcium, prolia, Vitamin D ASSESSMENT: The BMD measured at Femur Neck Left is 0.779 g/cm2 with a T-score of -1.9. This patient is considered osteopenic according to World Health Organization [facility]) criteria. L-2 &4 were excluded due to degenerative changes. The scan quality is limited by exclusion of L-2&4. Patient is not a candidate for FRAX assessment due to Prolia use. Site Region Measured Measured WHO Young Adult BMD Date       Age      Classification T-score AP Spine L1-L3 (L2) 5/05/2019 84.3 Osteopenia -1.1 1.031 g/cm2 DualFemur Neck Left 03/05/2019 84.3 Osteopenia -1.9 0.779 g/cm2 World Health Organization [facility]) criteria for post-menopausal, Caucasian Women: Normal:       T-score at or above -1 SD Osteopenia:   T-score between -1 and -2.5 SD Osteoporosis: T-score at or below -2.5 SD  RECOMMENDATIONS: 1. All patients should optimize calcium and vitamin D intake. 2. Consider FDA-approved medical therapies in postmenopausal women and men aged 62 years and older, based on the following: a. A hip or vertebral (clinical or morphometric) fracture b. T-score < -2.5 at the femoral neck or spine after appropriate evaluation to exclude secondary causes c. Low bone mass (T-score between -1.0 and -2.5 at the femoral neck or spine) and a 10-year probability of a hip fracture > 3% or a 10-year probability of a major osteoporosis-related fracture > 20% based on the [place]-adapted WHO algorithm d. Clinician judgment and/or patient preferences may indicate treatment for people with 10-year fracture probabilities above or below these levels FOLLOW-UP: People with diagnosed cases of osteoporosis or at high risk for fracture should have regular bone mineral density tests. For patients eligible for Medicare, routine testing is allowed once every 2 years. The testing frequency can be increased to one year for patients who have rapidly progressing disease, those who are receiving or discontinuing medical therapy to restore bone mass, or have additional risk factors. Electronically Signed   By: Danae Orleans M.D.   On: 03/05/2019 09:04   MM 3D SCREEN BREAST BILATERAL  Result Date: 03/05/2019 CLINICAL DATA:  Screening. EXAM: DIGITAL SCREENING BILATERAL MAMMOGRAM WITH TOMO AND CAD COMPARISON:  Previous exam(s). ACR Breast Density Category b: There are scattered areas of fibroglandular density. FINDINGS: There are no findings suspicious for malignancy. Images were processed with CAD. IMPRESSION: No mammographic evidence of malignancy. A result letter of this screening mammogram will be mailed directly to the patient. RECOMMENDATION: Screening mammogram in one year. (Code:SM-B-01Y) BI-RADS CATEGORY  1: Negative. Electronically Signed   By: Harmon Pier M.D.   On: 03/05/2019 12:36    Assessment & Plan:   Problem List Items  Addressed This Visit      Unprioritized   Degenerative joint disease (DJD) of hip    Her pain is not constant and improves with exercise..  She has deferred orthopedic referral       Fracture of rib    Pain has resolved clinically.       HTN (hypertension)    Well controlled on current regimen of maxzide. . Renal function stable, no changes today  Lab Results  Component Value Date   CREATININE 0.75 11/04/2019   Lab Results  Component Value Date   NA 136 11/04/2019   K 4.2 11/04/2019   CL 100 11/04/2019   CO2 29 11/04/2019         Hyperlipidemia with target LDL less  than 130 - Primary    Managed with pravastatin and asa.  LDL is at goal and  lfts are normal   Lab Results  Component Value Date   CHOL 205 (H) 02/10/2019   HDL 88.90 02/10/2019   LDLCALC 100 (H) 02/10/2019   LDLDIRECT 109.7 09/27/2011   TRIG 79.0 02/10/2019   CHOLHDL 2 02/10/2019       Lab Results  Component Value Date   ALT 15 11/04/2019   AST 15 11/04/2019   ALKPHOS 51 11/04/2019   BILITOT 0.4 11/04/2019         Relevant Orders   Comprehensive metabolic panel   Lipid panel   Insomnia    I have reviewed the dangers of prescribing this drug to the elderly with patient.  She has been [place] dependent for over two years, and prior attempts to use an alternative medication  have been unsuccessful resulting in loss of sleep..She is willing to accept the risks of adverse events. Refilled today       Osteoporosis   Relevant Medications   Cholecalciferol (VITAMIN D3) 50 MCG (2000 UT) CAPS      I have discontinued Shalika [name]. Wheeless "B Zarr"'s ALPRAZolam. I am also having her maintain her calcium carbonate, Riboflavin, Co Q 10, denosumab, magnesium oxide, nitroGLYCERIN, Cyanocobalamin, pravastatin, triamterene-hydrochlorothiazide, Multiple Vitamins-Minerals (PRESERVISION AREDS 2 PO), vitamin D3, and zolpidem. We administered denosumab. We will continue to administer denosumab.  Meds ordered this  encounter  Medications  . zolpidem (AMBIEN) 10 MG tablet    Sig: TAKE 1 TABLET(10 MG) BY MOUTH AT BEDTIME    Dispense:  30 tablet    Refill:  5  . denosumab (PROLIA) injection 60 mg    Medications Discontinued During This Encounter  Medication Reason  . Cholecalciferol (VITAMIN D3) 1000 UNITS CAPS Change in therapy  . zolpidem (AMBIEN) 10 MG tablet Reorder  . ALPRAZolam (XANAX) 0.5 MG tablet     Follow-up: Return in about 6 months (around 06/02/2020).   Sherlene Shams, MD

## 2019-12-05 NOTE — Assessment & Plan Note (Addendum)
Managed with pravastatin and asa.  LDL is at goal and  lfts are normal   Lab Results  Component Value Date   CHOL 205 (H) 02/10/2019   HDL 88.90 02/10/2019   LDLCALC 100 (H) 02/10/2019   LDLDIRECT 109.7 09/27/2011   TRIG 79.0 02/10/2019   CHOLHDL 2 02/10/2019       Lab Results  Component Value Date   ALT 15 11/04/2019   AST 15 11/04/2019   ALKPHOS 51 11/04/2019   BILITOT 0.4 11/04/2019

## 2019-12-05 NOTE — Assessment & Plan Note (Signed)
Pain has resolved clinically.

## 2019-12-05 NOTE — Assessment & Plan Note (Signed)
I have reviewed the dangers of prescribing this drug to the elderly with patient.  She has been [place] dependent for over two years, and prior attempts to use an alternative medication  have been unsuccessful resulting in loss of sleep..She is willing to accept the risks of adverse events. Refilled today

## 2019-12-05 NOTE — Assessment & Plan Note (Addendum)
Her pain is not constant and improves with exercise..  She has deferred orthopedic referral .  Her pain currently sounds like bursisitis and  involves the lateral thigh and the ischial tuberosity/  She has ben using advil daily .  Advised to reserve NSAID use  for pain not relieved with daily tylenol ,  Try Salon Pas with Licodaine

## 2019-12-05 NOTE — Assessment & Plan Note (Addendum)
Well controlled on current regimen of maxzide. . Renal function stable, no changes today  Lab Results  Component Value Date   CREATININE 0.75 11/04/2019   Lab Results  Component Value Date   NA 136 11/04/2019   K 4.2 11/04/2019   CL 100 11/04/2019   CO2 29 11/04/2019

## 2019-12-22 ENCOUNTER — Other Ambulatory Visit: Payer: Self-pay | Admitting: Internal Medicine

## 2020-01-26 ENCOUNTER — Other Ambulatory Visit: Payer: Self-pay | Admitting: Internal Medicine

## 2020-01-26 DIAGNOSIS — Z1231 Encounter for screening mammogram for malignant neoplasm of breast: Secondary | ICD-10-CM

## 2020-01-28 ENCOUNTER — Telehealth: Payer: Self-pay | Admitting: Internal Medicine

## 2020-01-28 MED ORDER — PEG 3350-KCL-NA BICARB-NACL 420 G PO SOLR: 4000.0000 mL | Freq: Once | ORAL | 0 refills | Status: AC

## 2020-01-28 NOTE — Telephone Encounter (Signed)
Spoke with pt and she stated that she has not had a normal bowel movement since thanksgiving. She is having a bowel movement daily but they are different. She stated that when they changed was one morning when she pt a painful lots of straining bowel movement and then the next day she didn't have one at all. She went and got a stool softener so that she wouldn't get backed up. The day after not having a bowel movement and starting the stool softener she started having small pencil size stools. Pt was wanting to know if she should stop the stool softener and take an actual laxative to clean her out. I scheduled pt for an appt next Wednesday first available.

## 2020-01-28 NOTE — Telephone Encounter (Signed)
Go lytlely sent to pharmacy.  Drink 8 ounce glass every 30 minutes until cleaned out

## 2020-01-28 NOTE — Telephone Encounter (Signed)
Patient called in stated that her bowels have change for one month lower bowel are loose have lost weight.

## 2020-01-28 NOTE — Telephone Encounter (Signed)
Spoke with Laura [name] to let her know what medication was sent in and how she should take it. Laura [name] gave a verbal understanding but stated that she would not be able to do it until Saturday.

## 2020-02-03 ENCOUNTER — Encounter: Payer: Self-pay | Admitting: Internal Medicine

## 2020-02-03 ENCOUNTER — Other Ambulatory Visit: Payer: Self-pay

## 2020-02-03 ENCOUNTER — Ambulatory Visit (INDEPENDENT_AMBULATORY_CARE_PROVIDER_SITE_OTHER): Payer: Medicare PPO

## 2020-02-03 ENCOUNTER — Ambulatory Visit: Payer: Medicare PPO | Admitting: Internal Medicine

## 2020-02-03 VITALS — BP 130/74 | HR 92 | Temp 98.1°F | Resp 16 | Ht 64.0 in | Wt 145.4 lb

## 2020-02-03 DIAGNOSIS — R198 Other specified symptoms and signs involving the digestive system and abdomen: Secondary | ICD-10-CM | POA: Insufficient documentation

## 2020-02-03 DIAGNOSIS — K5900 Constipation, unspecified: Secondary | ICD-10-CM

## 2020-02-03 DIAGNOSIS — K6289 Other specified diseases of anus and rectum: Secondary | ICD-10-CM

## 2020-02-03 DIAGNOSIS — R634 Abnormal weight loss: Secondary | ICD-10-CM

## 2020-02-03 LAB — COMPREHENSIVE METABOLIC PANEL
ALT: 13 U/L (ref 0–35)
AST: 15 U/L (ref 0–37)
Albumin: 4.6 g/dL (ref 3.5–5.2)
Alkaline Phosphatase: 49 U/L (ref 39–117)
BUN: 11 mg/dL (ref 6–23)
CO2: 28 mEq/L (ref 19–32)
Calcium: 9.2 mg/dL (ref 8.4–10.5)
Chloride: 100 mEq/L (ref 96–112)
Creatinine, Ser: 0.63 mg/dL (ref 0.40–1.20)
GFR: 81 mL/min (ref >60.00)
Glucose, Bld: 85 mg/dL (ref 70–99)
Potassium: 3.8 mEq/L (ref 3.5–5.1)
Sodium: 137 mEq/L (ref 135–145)
Total Bilirubin: 0.5 mg/dL (ref 0.2–1.2)
Total Protein: 6.9 g/dL (ref 6.0–8.3)

## 2020-02-03 LAB — CBC WITH DIFFERENTIAL/PLATELET
Basophils Absolute: 0 10*3/uL (ref 0.0–0.1)
Basophils Relative: 0.4 % (ref 0.0–3.0)
Eosinophils Absolute: 0.2 10*3/uL (ref 0.0–0.7)
Eosinophils Relative: 2.5 % (ref 0.0–5.0)
HCT: 41.4 % (ref 36.0–46.0)
Hemoglobin: 13.7 g/dL (ref 12.0–15.0)
Lymphocytes Relative: 28.1 % (ref 12.0–46.0)
Lymphs Abs: 2.1 10*3/uL (ref 0.7–4.0)
MCHC: 33.2 g/dL (ref 30.0–36.0)
MCV: 87.8 fl (ref 78.0–100.0)
Monocytes Absolute: 0.4 10*3/uL (ref 0.1–1.0)
Monocytes Relative: 5.6 % (ref 3.0–12.0)
Neutro Abs: 4.6 10*3/uL (ref 1.4–7.7)
Neutrophils Relative %: 63.4 % (ref 43.0–77.0)
Platelets: 297 10*3/uL (ref 150.0–400.0)
RBC: 4.71 Mil/uL (ref 3.87–5.11)
RDW: 13.4 % (ref 11.5–15.5)
WBC: 7.3 10*3/uL (ref 4.0–10.5)

## 2020-02-03 LAB — POC HEMOCCULT BLD/STL (OFFICE/1-CARD/DIAGNOSTIC): Card #1 Date: POSITIVE

## 2020-02-03 LAB — TSH: TSH: 2.23 u[IU]/mL (ref 0.35–4.50)

## 2020-02-03 NOTE — Progress Notes (Signed)
Subjective:  Patient ID: Laura [name], female    DOB: February 19, 1932  Age: 87 y.o. MRN: [id number]  CC: The primary encounter diagnosis was Rectal pain. Diagnoses of Unintentional weight loss, Constipation, unspecified constipation type, and Altered bowel function were also pertinent to this visit.  HPI Laura [name] presents for follow up on new onset constipation.   This visit occurred during the SARS-CoV-2 public health emergency.  Safety protocols were in place, including screening questions prior to the visit, additional usage of staff PPE, and extensive cleaning of exam room while observing appropriate contact time as indicated for disinfecting solutions.   87 yr old female with no history of constipation, normal colonoscopy 2012 presents with new onset constipation since Nov 19.  Started with a painful incomplete evacuation despite straining. Started taking a daily  stool softeners,  Stools were pencil thin for 8 days ,  During which time she Became increasingly distended , developed  LLQ pain . After one normal caliber stool , stools became Pencil thin  Again.  Took a complete course of go lytely, got cleaned out , and abdominal distension resolved.   Stools since then have been at times,  Normal caliber,  At times pencil thin, at times explosive.   Did not have any rectal pain until several days ago, and on self exam felt an abnormal fleshy protrusion from anus. Has not seen blood in stool .  Seh also notes that she has had an unintentional weight loss of 5 lbs  Over the holidays which is confirmed by our office vital sign records.   Last colonoscopy 2012 and normal.   Outpatient Medications Prior to Visit  Medication Sig Dispense Refill  . calcium carbonate (OS-CAL) 600 MG TABS Take 600 mg by mouth 2 (two) times daily with a meal.    . Cholecalciferol (VITAMIN D3) 50 MCG (2000 UT) CAPS Take 1 capsule by mouth daily.    . Coenzyme Q10 (CO Q 10) 100 MG CAPS Take 1 capsule by mouth  daily.    . Cyanocobalamin 2500 MCG SUBL PLACE 1 TABLET UNDER THE TONGUE DAILY AS DIRECTED 90 tablet 1  . denosumab (PROLIA) 60 MG/ML SOLN injection Inject 60 mg into the skin every 6 (six) months. Administer in upper arm, thigh, or abdomen    . magnesium oxide (MAG-OX) 400 MG tablet Take 400 mg by mouth 2 (two) times daily.    . Multiple Vitamins-Minerals (PRESERVISION AREDS 2 PO) Take 1 capsule by mouth daily.    . nitroGLYCERIN (NITROSTAT) 0.4 MG SL tablet Place 1 tablet (0.4 mg total) under the tongue every 5 (five) minutes as needed for chest pain. 50 tablet 3  . pravastatin (PRAVACHOL) 40 MG tablet TAKE 1 TABLET BY MOUTH EVERY DAY 90 tablet 3  . Riboflavin 100 MG CAPS Take 1 capsule by mouth 2 (two) times daily.     Marland Kitchen triamterene-hydrochlorothiazide (MAXZIDE-25) 37.5-25 MG tablet TAKE 1 TABLET BY MOUTH DAILY 90 tablet 1  . zolpidem (AMBIEN) 10 MG tablet TAKE 1 TABLET(10 MG) BY MOUTH AT BEDTIME 30 tablet 5   Facility-Administered Medications Prior to Visit  Medication Dose Route Frequency Provider Last Rate Last Admin  . denosumab (PROLIA) injection 60 mg  60 mg Subcutaneous Once Sherlene Shams, MD        Review of Systems;  Patient denies headache, fevers, malaise, unintentional weight loss, skin rash, eye pain, sinus congestion and sinus pain, sore throat, dysphagia,  hemoptysis , cough, dyspnea, wheezing, chest  pain, palpitations, orthopnea, edema, a, nausea, melena,, flank pain, dysuria, hematuria, urinary  Frequency, nocturia, numbness, tingling, seizures,  Focal weakness, Loss of consciousness,  Tremor, insomnia, depression, anxiety, and suicidal ideation.      Objective:  BP 130/74   Pulse 92   Temp 98.1 F (36.7 C) (Oral)   Resp 16   Ht 5\' 4"  (1.626 m)   Wt 145 lb 6.4 oz (66 kg)   SpO2 97%   BMI 24.96 kg/m   BP Readings from Last 3 Encounters:  02/03/20 130/74  12/04/19 118/66  09/25/19 130/68    Wt Readings from Last 3 Encounters:  02/03/20 145 lb 6.4 oz (66  kg)  12/04/19 150 lb (68 kg)  09/25/19 151 lb (68.5 kg)    General appearance: alert, cooperative and appears stated age Ears: normal TM's and external ear canals both ears Throat: lips, mucosa, and tongue normal; teeth and gums normal Neck: no adenopathy, no carotid bruit, supple, symmetrical, trachea midline and thyroid not enlarged, symmetric, no tenderness/mass/nodules Back: symmetric, no curvature. ROM normal. No CVA tenderness. Lungs: clear to auscultation bilaterally Heart: regular rate and rhythm, S1, S2 normal, no murmur, click, rub or gallop Abdomen: soft, non-tender; bowel sounds normal; no masses,  no organomegaly Pulses: 2+ and symmetric Skin: Skin color, texture, turgor normal. No rashes or lesions Rectal:  nontender  Exam.  Large external hemorrhoid,  Small internal hemorrhoid at 1:00,  Faintly guaiac positive.  Lymph nodes: Cervical, supraclavicular, and axillary nodes normal.  Lab Results  Component Value Date   HGBA1C 5.6 02/10/2019   HGBA1C 5.4 05/05/2014    Lab Results  Component Value Date   CREATININE 0.75 11/04/2019   CREATININE 0.75 02/10/2019   CREATININE 0.70 12/01/2018    Lab Results  Component Value Date   WBC 6.1 07/02/2018   HGB 14.1 07/02/2018   HCT 40.9 07/02/2018   PLT 270.0 07/02/2018   GLUCOSE 93 11/04/2019   CHOL 205 (H) 02/10/2019   TRIG 79.0 02/10/2019   HDL 88.90 02/10/2019   LDLDIRECT 109.7 09/27/2011   LDLCALC 100 (H) 02/10/2019   ALT 15 11/04/2019   AST 15 11/04/2019   NA 136 11/04/2019   K 4.2 11/04/2019   CL 100 11/04/2019   CREATININE 0.75 11/04/2019   BUN 15 11/04/2019   CO2 29 11/04/2019   TSH 3.05 07/02/2018   HGBA1C 5.6 02/10/2019    DG Bone Density  Result Date: 03/05/2019 EXAM: DUAL X-RAY ABSORPTIOMETRY (DXA) FOR BONE MINERAL DENSITY IMPRESSION: Your patient Laura [name] completed a BMD test on 03/05/2019 using the 05/03/2019 Advance DXA System (analysis version: 14.10) manufactured by [employer]. The  following summarizes the results of our evaluation. PATIENT BIOGRAPHICAL: Name: Laura [name] Patient ID: Laura [name] Birth Date: [birth year] Height: 63.5 in. Gender: Female Exam Date: 03/05/2019 Weight: 149.2 lbs. Indications: Advanced Age, Bilateral Ovariectomy, Caucasian, Height Loss, History of Fracture (Adult), Hysterectomy, Osteoporotic, POSTmenopausal Fractures: lt hand, rt foot Treatments: Calcium, prolia, Vitamin D ASSESSMENT: The BMD measured at Femur Neck Left is 0.779 g/cm2 with a T-score of -1.9. This patient is considered osteopenic according to World Health Organization [facility]) criteria. L-2 &4 were excluded due to degenerative changes. The scan quality is limited by exclusion of L-2&4. Patient is not a candidate for FRAX assessment due to Prolia use. Site Region Measured Measured WHO Young Adult BMD Date       Age      Classification T-score AP Spine L1-L3 (L2) 5/05/2019 84.3 Osteopenia -1.1 1.031 g/cm2  DualFemur Neck Left 5/05/2019 84.3 Osteopenia -1.9 0.779 g/cm2 World Health Organization [facility]) criteria for post-menopausal, Caucasian Women: Normal:       T-score at or above -1 SD Osteopenia:   T-score between -1 and -2.5 SD Osteoporosis: T-score at or below -2.5 SD RECOMMENDATIONS: 1. All patients should optimize calcium and vitamin D intake. 2. Consider FDA-approved medical therapies in postmenopausal women and men aged 49 years and older, based on the following: a. A hip or vertebral (clinical or morphometric) fracture b. T-score < -2.5 at the femoral neck or spine after appropriate evaluation to exclude secondary causes c. Low bone mass (T-score between -1.0 and -2.5 at the femoral neck or spine) and a 10-year probability of a hip fracture > 3% or a 10-year probability of a major osteoporosis-related fracture > 20% based on the [place]-adapted WHO algorithm d. Clinician judgment and/or patient preferences may indicate treatment for people with 10-year fracture probabilities above or below these  levels FOLLOW-UP: People with diagnosed cases of osteoporosis or at high risk for fracture should have regular bone mineral density tests. For patients eligible for Medicare, routine testing is allowed once every 2 years. The testing frequency can be increased to one year for patients who have rapidly progressing disease, those who are receiving or discontinuing medical therapy to restore bone mass, or have additional risk factors. Electronically Signed   By: Marlaine Hind M.D.   On: 03/05/2019 09:04   MM 3D SCREEN BREAST BILATERAL  Result Date: 03/05/2019 CLINICAL DATA:  Screening. EXAM: DIGITAL SCREENING BILATERAL MAMMOGRAM WITH TOMO AND CAD COMPARISON:  Previous exam(s). ACR Breast Density Category b: There are scattered areas of fibroglandular density. FINDINGS: There are no findings suspicious for malignancy. Images were processed with CAD. IMPRESSION: No mammographic evidence of malignancy. A result letter of this screening mammogram will be mailed directly to the patient. RECOMMENDATION: Screening mammogram in one year. (Code:SM-B-01Y) BI-RADS CATEGORY  1: Negative. Electronically Signed   By: Margarette Canada M.D.   On: 03/05/2019 12:36    Assessment & Plan:   Problem List Items Addressed This Visit      Unprioritized   Altered bowel function     New onset,  With unintentional weight loss.  She is reluctant to see GI for colonoscopy due to her age.  Will check CEA, CBC,  Add citrucel/miralax and reassess in 2 -3 weeks        Other Visit Diagnoses    Rectal pain    -  Primary   Relevant Orders   POC Hemoccult Bld/Stl (1-Cd Office Dx) (Completed)   CEA   Unintentional weight loss       Relevant Orders   CBC with Differential/Platelet   TSH   Comprehensive metabolic panel   DG Abd 1 View   Constipation, unspecified constipation type       Relevant Orders   DG Abd 1 View      I am having Betzayda J. Benn "B Alderman" maintain her calcium carbonate, Riboflavin, Co Q 10, denosumab,  magnesium oxide, nitroGLYCERIN, Cyanocobalamin, triamterene-hydrochlorothiazide, Multiple Vitamins-Minerals (PRESERVISION AREDS 2 PO), vitamin D3, zolpidem, and pravastatin. We will continue to administer denosumab.  No orders of the defined types were placed in this encounter.   There are no discontinued medications.  Follow-up: No follow-ups on file.   Crecencio Mc, MD

## 2020-02-03 NOTE — Patient Instructions (Addendum)
You have an external and an internal hemorrhoid.  Stop the stool softener.  Star using miralax OR citrucel nightly.    60 ounces of water daily  For the hemorrhoids   Start using  anusol HC or any hemorrhoid  cream that has pramoxine in it to help shrink the hemorrhoid.    Nupercainal is also available OTC  Is is a mild numbing medication so will help with pain.  We will decide in 2 weeks whether a GI referral is needed

## 2020-02-03 NOTE — Assessment & Plan Note (Signed)
New onset,  With unintentional weight loss.  She is reluctant to see GI for colonoscopy due to her age.  Will check CEA, CBC,  Add citrucel/miralax and reassess in 2 -3 weeks

## 2020-02-04 ENCOUNTER — Other Ambulatory Visit: Payer: Self-pay | Admitting: Internal Medicine

## 2020-02-04 LAB — CEA: CEA: <0.5 ng/mL

## 2020-02-04 NOTE — Progress Notes (Signed)
Message #2... And your labs are reassuringly normal!

## 2020-02-04 NOTE — Progress Notes (Signed)
You have a large amount of stool throughout your colon.  Amazing, in just five days,  that you can accumulate that much.  Hoping the miralax helps.  The other incidental findings are not urgent and can be addressed at follow up

## 2020-02-16 DIAGNOSIS — R198 Other specified symptoms and signs involving the digestive system and abdomen: Secondary | ICD-10-CM

## 2020-02-23 ENCOUNTER — Telehealth (INDEPENDENT_AMBULATORY_CARE_PROVIDER_SITE_OTHER): Payer: Medicare PPO | Admitting: Internal Medicine

## 2020-02-23 ENCOUNTER — Encounter: Payer: Self-pay | Admitting: Internal Medicine

## 2020-02-23 DIAGNOSIS — R198 Other specified symptoms and signs involving the digestive system and abdomen: Secondary | ICD-10-CM | POA: Diagnosis not present

## 2020-02-23 NOTE — Progress Notes (Signed)
Virtual Visit via Caregility  This visit type was conducted due to national recommendations for restrictions regarding the COVID-19 pandemic (e.g. social distancing).  This format is felt to be most appropriate for this patient at this time.  All issues noted in this document were discussed and addressed.  No physical exam was performed (except for noted visual exam findings with Video Visits).   I connected with@ on 02/23/20 at  2:30 PM EST by a video enabled telemedicine application or telephone and verified that I am speaking with the correct person using two identifiers. Location patient: home Location provider: work or home office Persons participating in the virtual visit: patient, provider  I discussed the limitations, risks, security and privacy concerns of performing an evaluation and management service by telephone and the availability of in person appointments. I also discussed with the patient that there may be a patient responsible charge related to this service. The patient expressed understanding and agreed to proceed.  Reason for visit: new onset constipation   HPI:  87 yr female with new onset constipation x 2 months ,  Temporarily resolved with Go lytely,  But recurred several days later  despite use of miralax daily for 7 days  And adequate water intake .  Stools are exclusively narrow in caliber.  She has treated an external hemorrhoid that was painful,  And completed treatment for a presumed  internal hemorrhoid (based on my exam) for one week with no change in caliber of stools.  She has lost 6 lbs over the last 2 moths and feels bloated.  Labs reviewed, including TSH and CEA.   Gi referral with Dr Maximino Greenland is in progress but appt is not for several weeks (March)   Remote history of peritonitis 45 yrs ago secondary to complications from a tubal ligation ; required  Hysterectomy and  bowel resection with loss of 11 inches.  Records unavailable.  Last colonoscopy 2012 , in  chart.      ROS: See pertinent positives and negatives per HPI.  Past Medical History:  Diagnosis Date  . Accidental fall 10/16/2013  . Anxiety   . Carotid stenosis    Left   . High cholesterol   . Hypertension    Pt states that her blood pressure is very well controlled  . Migraine with aura   . Neuropathy   . Osteoporosis     Past Surgical History:  Procedure Laterality Date  . ABDOMINAL HYSTERECTOMY     secondary to peritonitis and complications   . APPENDECTOMY    . TONSILLECTOMY    . TUBAL LIGATION     at age 18, vaginal complicated by peritonitis    Family History  Adopted: Yes  Problem Relation Age of Onset  . Breast cancer Mother 23  . Cancer Daughter        Breast   . Breast cancer Daughter 39  . Breast cancer Sister 72       2 times    SOCIAL HX:  reports that she quit smoking about 57 years ago. She has never used smokeless tobacco. She reports current alcohol use of about 14.0 standard drinks of alcohol per week. She reports that she does not use drugs.   Current Outpatient Medications:  .  calcium carbonate (OS-CAL) 600 MG TABS, Take 600 mg by mouth 2 (two) times daily with a meal., Disp: , Rfl:  .  Cholecalciferol (VITAMIN D3) 50 MCG (2000 UT) CAPS, Take 1 capsule by mouth daily., Disp: ,  Rfl:  .  Coenzyme Q10 (CO Q 10) 100 MG CAPS, Take 1 capsule by mouth daily., Disp: , Rfl:  .  Cyanocobalamin 2500 MCG SUBL, PLACE 1 TABLET UNDER THE TONGUE DAILY AS DIRECTED, Disp: 90 tablet, Rfl: 1 .  denosumab (PROLIA) 60 MG/ML SOLN injection, Inject 60 mg into the skin every 6 (six) months. Administer in upper arm, thigh, or abdomen, Disp: , Rfl:  .  magnesium oxide (MAG-OX) 400 MG tablet, Take 400 mg by mouth 2 (two) times daily., Disp: , Rfl:  .  Multiple Vitamins-Minerals (PRESERVISION AREDS 2 PO), Take 1 capsule by mouth daily., Disp: , Rfl:  .  nitroGLYCERIN (NITROSTAT) 0.4 MG SL tablet, Place 1 tablet (0.4 mg total) under the tongue every 5 (five) minutes as  needed for chest pain., Disp: 50 tablet, Rfl: 3 .  polyethylene glycol (MIRALAX) 17 g packet, Take 17 g by mouth daily., Disp: , Rfl:  .  pravastatin (PRAVACHOL) 40 MG tablet, TAKE 1 TABLET BY MOUTH EVERY DAY, Disp: 90 tablet, Rfl: 3 .  Riboflavin 100 MG CAPS, Take 1 capsule by mouth 2 (two) times daily. , Disp: , Rfl:  .  triamterene-hydrochlorothiazide (MAXZIDE-25) 37.5-25 MG tablet, TAKE 1 TABLET BY MOUTH DAILY, Disp: 90 tablet, Rfl: 1 .  zolpidem (AMBIEN) 10 MG tablet, TAKE 1 TABLET(10 MG) BY MOUTH AT BEDTIME, Disp: 30 tablet, Rfl: 5  Current Facility-Administered Medications:  .  denosumab (PROLIA) injection 60 mg, 60 mg, Subcutaneous, Once, Darrick Huntsman, Mar Daring, MD  EXAM:  VITALS per patient if applicable:  GENERAL: alert, oriented, appears well and in no acute distress  HEENT: atraumatic, conjunttiva clear, no obvious abnormalities on inspection of external nose and ears  NECK: normal movements of the head and neck  LUNGS: on inspection no signs of respiratory distress, breathing rate appears normal, no obvious gross SOB, gasping or wheezing  CV: no obvious cyanosis  MS: moves all visible extremities without noticeable abnormality  PSYCH/NEURO: pleasant and cooperative, no obvious depression or anxiety, speech and thought processing grossly intact  ASSESSMENT AND PLAN:  Discussed the following assessment and plan:  Altered bowel function  Altered bowel function Etiology unclear.  Will consider UFI/SBFT vs CT abd pelvis prior to GI evaluation pending conversation with Dr Maximino Greenland.  Continue use of miralax for now.     I discussed the assessment and treatment plan with the patient. The patient was provided an opportunity to ask questions and all were answered. The patient agreed with the plan and demonstrated an understanding of the instructions.   The patient was advised to call back or seek an in-person evaluation if the symptoms worsen or if the condition fails to improve  as anticipated.   I provided  30 minutes  during this encounter reviewing patient's current problems and past surgeries, labs and imaging studies, providing counseling on the above mentioned problems I na face to face visit  , and coordination  of care .  Sherlene Shams, MD

## 2020-02-23 NOTE — Assessment & Plan Note (Signed)
Etiology unclear.  Will consider UFI/SBFT vs CT abd pelvis prior to GI evaluation pending conversation with Dr Maximino Greenland.  Continue use of miralax for now.

## 2020-03-02 ENCOUNTER — Other Ambulatory Visit: Payer: Self-pay

## 2020-03-02 ENCOUNTER — Encounter: Payer: Self-pay | Admitting: Gastroenterology

## 2020-03-02 ENCOUNTER — Ambulatory Visit: Payer: Medicare PPO | Admitting: Gastroenterology

## 2020-03-02 VITALS — BP 144/84 | HR 91 | Temp 98.2°F | Ht 64.0 in | Wt 144.0 lb

## 2020-03-02 DIAGNOSIS — R1084 Generalized abdominal pain: Secondary | ICD-10-CM

## 2020-03-02 DIAGNOSIS — R194 Change in bowel habit: Secondary | ICD-10-CM

## 2020-03-02 NOTE — Patient Instructions (Addendum)
Psyllium granules or powder for solution What is this medicine? PSYLLIUM (SIL i yum) is a bulk-forming fiber laxative. This medicine is used to treat constipation. Increasing fiber in the diet may also help lower cholesterol and promote heart health for some people. This medicine may be used for other purposes; ask your health care provider or pharmacist if you have questions. COMMON BRAND NAME(S): Fiber Therapy, GenFiber, Geri-Mucil, Hydrocil, Konsyl, Metamucil, Metamucil MultiHealth, Mucilin, Natural Fiber Therapy, Reguloid What should I tell my health care provider before I take this medicine? They need to know if you have any of these conditions:  blockage in your bowel  difficulty swallowing  inflammatory bowel disease  phenylketonuria  stomach or intestine problems  sudden change in bowel habits lasting more than 2 weeks  an unusual or allergic reaction to psyllium, other medicines, dyes, or preservatives  pregnant or trying or get pregnant  breast-feeding How should I use this medicine? Mix this medicine into a full glass (240 mL) of water or other cool drink. Take this medicine by mouth. Follow the directions on the package labeling, or take as directed by your health care professional. Take your medicine at regular intervals. Do not take your medicine more often than directed. Talk to your pediatrician regarding the use of this medicine in children. While this drug may be prescribed for children as young as 45 years old for selected conditions, precautions do apply. Overdosage: If you think you have taken too much of this medicine contact a poison control center or emergency room at once. NOTE: This medicine is only for you. Do not share this medicine with others. What if I miss a dose? If you miss a dose, take it as soon as you can. If it is almost time for your next dose, take only that dose. Do not take double or extra doses. What may interact with this  medicine? Interactions are not expected. Take this product at least 2 hours before or after other medicines. This list may not describe all possible interactions. Give your health care provider a list of all the medicines, herbs, non-prescription drugs, or dietary supplements you use. Also tell them if you smoke, drink alcohol, or use illegal drugs. Some items may interact with your medicine. What should I watch for while using this medicine? Check with your doctor or health care professional if your symptoms do not start to get better or if they get worse. Stop using this medicine and contact your doctor or health care professional if you have rectal bleeding or if you have to treat your constipation for more than 1 week. These could be signs of a more serious condition. Drink several glasses of water a day while you are taking this medicine. This will help to relieve constipation and prevent dehydration. What side effects may I notice from receiving this medicine? Side effects that you should report to your doctor or health care professional as soon as possible:  allergic reactions like skin rash, itching or hives, swelling of the face, lips, or tongue  breathing problems  chest pain  nausea, vomiting  rectal bleeding  trouble swallowing Side effects that usually do not require medical attention (report to your doctor or health care professional if they continue or are bothersome):  bloating  gas  stomach cramps This list may not describe all possible side effects. Call your doctor for medical advice about side effects. You may report side effects to FDA at 1-800-FDA-1088. Where should I keep my medicine?  Keep out of the reach of children. Store at room temperature between 15 and 30 degrees C (59 and 86 degrees F). Protect from moisture. Throw away any unused medicine after the expiration date. NOTE: This sheet is a summary. It may not cover all possible information. If you have  questions about this medicine, talk to your doctor, pharmacist, or health care provider.  2021 Elsevier/Gold Standard (2017-06-11 15:41:08) High-Fiber Eating Plan Fiber, also called dietary fiber, is a type of carbohydrate. It is found foods such as fruits, vegetables, whole grains, and beans. A high-fiber diet can have many health benefits. Your health care provider may recommend a high-fiber diet to help:  Prevent constipation. Fiber can make your bowel movements more regular.  Lower your cholesterol.  Relieve the following conditions: ? Inflammation of veins in the anus (hemorrhoids). ? Inflammation of specific areas of the digestive tract (uncomplicated diverticulosis). ? A problem of the large intestine, also called the colon, that sometimes causes pain and diarrhea (irritable bowel syndrome, or IBS).  Prevent overeating as part of a weight-loss plan.  Prevent heart disease, type 2 diabetes, and certain cancers. What are tips for following this plan? Reading food labels  Check the nutrition facts label on food products for the amount of dietary fiber. Choose foods that have 5 grams of fiber or more per serving.  The goals for recommended daily fiber intake include: ? Men (age 86 or younger): 34-38 g. ? Men (over age 24): 28-34 g. ? Women (age 8 or younger): 25-28 g. ? Women (over age 59): 22-25 g. Your daily fiber goal is _____________ g.   Shopping  Choose whole fruits and vegetables instead of processed forms, such as apple juice or applesauce.  Choose a wide variety of high-fiber foods such as avocados, lentils, oats, and kidney beans.  Read the nutrition facts label of the foods you choose. Be aware of foods with added fiber. These foods often have high sugar and sodium amounts per serving. Cooking  Use whole-grain flour for baking and cooking.  Cook with brown rice instead of white rice. Meal planning  Start the day with a breakfast that is high in fiber, such as  a cereal that contains 5 g of fiber or more per serving.  Eat breads and cereals that are made with whole-grain flour instead of refined flour or white flour.  Eat brown rice, bulgur wheat, or millet instead of white rice.  Use beans in place of meat in soups, salads, and pasta dishes.  Be sure that half of the grains you eat each day are whole grains. General information  You can get the recommended daily intake of dietary fiber by: ? Eating a variety of fruits, vegetables, grains, nuts, and beans. ? Taking a fiber supplement if you are not able to take in enough fiber in your diet. It is better to get fiber through food than from a supplement.  Gradually increase how much fiber you consume. If you increase your intake of dietary fiber too quickly, you may have bloating, cramping, or gas.  Drink plenty of water to help you digest fiber.  Choose high-fiber snacks, such as berries, raw vegetables, nuts, and popcorn. What foods should I eat? Fruits Berries. Pears. Apples. Oranges. Avocado. Prunes and raisins. Dried figs. Vegetables Sweet potatoes. Spinach. Kale. Artichokes. Cabbage. Broccoli. Cauliflower. Green peas. Carrots. Squash. Grains Whole-grain breads. Multigrain cereal. Oats and oatmeal. Brown rice. Barley. Bulgur wheat. Millet. Quinoa. Bran muffins. Popcorn. Rye wafer crackers. Meats and  other proteins Navy beans, kidney beans, and pinto beans. Soybeans. Split peas. Lentils. Nuts and seeds. Dairy Fiber-fortified yogurt. Beverages Fiber-fortified soy milk. Fiber-fortified orange juice. Other foods Fiber bars. The items listed above may not be a complete list of recommended foods and beverages. Contact a dietitian for more information. What foods should I avoid? Fruits Fruit juice. Cooked, strained fruit. Vegetables Fried potatoes. Canned vegetables. Well-cooked vegetables. Grains White bread. Pasta made with refined flour. White rice. Meats and other proteins Fatty  cuts of meat. Fried chicken or fried fish. Dairy Milk. Yogurt. Cream cheese. Sour cream. Fats and oils Butters. Beverages Soft drinks. Other foods Cakes and pastries. The items listed above may not be a complete list of foods and beverages to avoid. Talk with your dietitian about what choices are best for you. Summary  Fiber is a type of carbohydrate. It is found in foods such as fruits, vegetables, whole grains, and beans.  A high-fiber diet has many benefits. It can help to prevent constipation, lower blood cholesterol, aid weight loss, and reduce your risk of heart disease, diabetes, and certain cancers.  Increase your intake of fiber gradually. Increasing fiber too quickly may cause cramping, bloating, and gas. Drink plenty of water while you increase the amount of fiber you consume.  The best sources of fiber include whole fruits and vegetables, whole grains, nuts, seeds, and beans. This information is not intended to replace advice given to you by your health care provider. Make sure you discuss any questions you have with your health care provider. Document Revised: 05/21/2019 Document Reviewed: 05/21/2019 Elsevier Patient Education  2021 [employer].

## 2020-03-03 NOTE — Progress Notes (Signed)
Laura [name] [address]  [address]  [place], [place] [address]  Main: [phone]  Fax: [phone]   Gastroenterology Consultation  Referring Provider:     Sherlene Shams, MD Primary Care Physician:  Sherlene Shams, MD Reason for Consultation:    Constipation        HPI:    Chief Complaint  Patient presents with  . Constipation    Laura [name] is a 87 y.o. y/o female referred for consultation & management  by Dr. Darrick Huntsman, Mar Daring, MD.  Patient reports that usually she has one regular bowel movement every morning but as of the last few months, this has changed to where her bowel movements are small, sometimes pencil like and she has to strain.  She sometimes has to go to the bathroom 2-3 times a day compared to the one good bowel movement she used to have in the mornings.  No blood in stool.  She states she weighs herself right before Thanksgiving and right after Christmas because usually since she eats more during the holidays, she gains weight, but this time she lost about 6-1/2 pounds after the holidays.  This concerned her and she brought it to the attention of her PCP.  No dysphagia, nausea or vomiting.  Recent labs have been very reassuring  Patient states she took stool softeners for short period time but stopped taking it thereafter.  She tried to find Metamucil in the stores, but could not find it.  Does describe having abdominal discomfort, bilateral lower quadrant that started with a change in her bowel habits.  Not associated with meals.  Not associate with nausea or vomiting  Previous colonoscopies include, colonoscopy in 2003 by Dr. Elsie Stain for diarrhea and hematochezia.  Excellent prep was reported, with extent of exam being the cecum.  Biopsies were obtained for microscopic colitis.  Diverticulosis and internal hemorrhoids were reported.  Exam was otherwise normal.  Colonoscopy in 2011 by Dr. Marva Panda, with good prep reported, extent of exam cecum.   Again diverticulosis and internal hemorrhoids were reported.  Past Medical History:  Diagnosis Date  . Accidental fall 10/16/2013  . Anxiety   . Carotid stenosis    Left   . High cholesterol   . Hypertension    Pt states that her blood pressure is very well controlled  . Migraine with aura   . Neuropathy   . Osteoporosis     Past Surgical History:  Procedure Laterality Date  . ABDOMINAL HYSTERECTOMY     secondary to peritonitis and complications   . APPENDECTOMY    . TONSILLECTOMY    . TUBAL LIGATION     at age 30, vaginal complicated by peritonitis    Prior to Admission medications   Medication Sig Start Date End Date Taking? Authorizing Provider  calcium carbonate (OS-CAL) 600 MG TABS Take 600 mg by mouth 2 (two) times daily with a meal.   Yes [provider]  Cholecalciferol (VITAMIN D3) 50 MCG (2000 UT) CAPS Take 1 capsule by mouth daily.   Yes [provider]  Coenzyme Q10 (CO Q 10) 100 MG CAPS Take 1 capsule by mouth daily.   Yes [provider]  Cyanocobalamin 2500 MCG SUBL PLACE 1 TABLET UNDER THE TONGUE DAILY AS DIRECTED 06/07/16  Yes Sherlene Shams, MD  denosumab (PROLIA) 60 MG/ML SOLN injection Inject 60 mg into the skin every 6 (six) months. Administer in upper arm, thigh, or abdomen   Yes [provider]  magnesium oxide (MAG-OX) 400 MG tablet Take 400 mg by mouth 2 (two) times daily.   Yes [provider]  Multiple Vitamins-Minerals (PRESERVISION AREDS 2 PO) Take 1 capsule by mouth daily.   Yes [provider]  pravastatin (PRAVACHOL) 40 MG tablet TAKE 1 TABLET BY MOUTH EVERY DAY 12/22/19  Yes Sherlene Shams, MD  Riboflavin 100 MG CAPS Take 1 capsule by mouth 2 (two) times daily.    Yes [provider]  triamterene-hydrochlorothiazide (MAXZIDE-25) 37.5-25 MG tablet TAKE 1 TABLET BY MOUTH DAILY 02/04/20  Yes Sherlene Shams, MD  zolpidem (AMBIEN) 10 MG tablet TAKE 1 TABLET(10 MG) BY MOUTH AT BEDTIME  12/04/19  Yes Sherlene Shams, MD  nitroGLYCERIN (NITROSTAT) 0.4 MG SL tablet Place 1 tablet (0.4 mg total) under the tongue every 5 (five) minutes as needed for chest pain. Patient not taking: Reported on 03/02/2020 08/26/15   Sherlene Shams, MD  polyethylene glycol (MIRALAX / GLYCOLAX) 17 g packet Take 17 g by mouth daily. Patient not taking: Reported on 03/02/2020    [provider]    Family History  Adopted: Yes  Problem Relation Age of Onset  . Breast cancer Mother 61  . Cancer Daughter        Breast   . Breast cancer Daughter 20  . Breast cancer Sister 38       2 times     Social History   Tobacco Use  . Smoking status: Former Smoker    Quit date: 1965    Years since quitting: 57.1  . Smokeless tobacco: Never Used  Substance Use Topics  . Alcohol use: Yes    Alcohol/week: 14.0 standard drinks    Types: 14 Glasses of wine per week    Comment: 2 glasses of wine per day.  . Drug use: No    Allergies as of 03/02/2020 - Review Complete 03/02/2020  Allergen Reaction Noted  . Erythromycin  03/21/2011    Review of Systems:    All systems reviewed and negative except where noted in HPI.   Physical Exam:  BP (!) 144/84   Pulse 91   Temp 98.2 F (36.8 C) (Oral)   Ht 5\' 4"  (1.626 m)   Wt 144 lb (65.3 kg)   BMI 24.72 kg/m  No LMP recorded. Patient has had a hysterectomy. Psych:  Alert and cooperative. Normal mood and affect. General:   Alert,  Well-developed, well-nourished, pleasant and cooperative in NAD Head:  Normocephalic and atraumatic. Eyes:  Sclera clear, no icterus.   Conjunctiva pink. Ears:  Normal auditory acuity. Nose:  No deformity, discharge, or lesions. Mouth:  No deformity or lesions,oropharynx pink & moist. Neck:  Supple; no masses or thyromegaly. Abdomen:  Normal bowel sounds.  No bruits.  Soft, non-tender and non-distended without masses, hepatosplenomegaly or hernias noted.  No guarding or rebound tenderness.    Msk:  Symmetrical without  gross deformities. Good, equal movement & strength bilaterally. Pulses:  Normal pulses noted. Extremities:  No clubbing or edema.  No cyanosis. Neurologic:  Alert and oriented x3;  grossly normal neurologically. Skin:  Intact without significant lesions or rashes. No jaundice. Lymph Nodes:  No significant cervical adenopathy. Psych:  Alert and cooperative. Normal mood and affect.   Labs: CBC    Component Value Date/Time   WBC 7.3 02/03/2020 1231   RBC 4.71 02/03/2020 1231   HGB 13.7 02/03/2020 1231   HCT 41.4 02/03/2020 1231   PLT 297.0 02/03/2020  1231   MCV 87.8 02/03/2020 1231   MCH 30.5 10/10/2014 1845   MCHC 33.2 02/03/2020 1231   RDW 13.4 02/03/2020 1231   LYMPHSABS 2.1 02/03/2020 1231   MONOABS 0.4 02/03/2020 1231   EOSABS 0.2 02/03/2020 1231   BASOSABS 0.0 02/03/2020 1231   CMP     Component Value Date/Time   NA 137 02/03/2020 1231   K 3.8 02/03/2020 1231   CL 100 02/03/2020 1231   CO2 28 02/03/2020 1231   GLUCOSE 85 02/03/2020 1231   BUN 11 02/03/2020 1231   CREATININE 0.63 02/03/2020 1231   CALCIUM 9.2 02/03/2020 1231   PROT 6.9 02/03/2020 1231   ALBUMIN 4.6 02/03/2020 1231   AST 15 02/03/2020 1231   ALT 13 02/03/2020 1231   ALKPHOS 49 02/03/2020 1231   BILITOT 0.5 02/03/2020 1231   GFRNONAA >60 10/10/2014 1845   GFRAA >60 10/10/2014 1845    Imaging Studies: DG Abd 1 View  Result Date: 02/03/2020 CLINICAL DATA:  Constipation.  Abdominal distention. EXAM: ABDOMEN - 1 VIEW COMPARISON:  No recent prior. FINDINGS: Soft tissue structures are unremarkable. Large amount of stool noted throughout the colon consistent constipation. No prominent bowel distention. No free air. Spine scoliosis concave right. Degenerative changes lumbar spine and both hips. Aortoiliac atherosclerotic vascular calcification. Pelvic calcifications consistent phleboliths. IMPRESSION: Large amount of stool noted throughout the colon consistent with constipation. No prominent bowel  distention. No free air. Electronically Signed   By: Maisie Fus  Register   On: 02/03/2020 15:32    Assessment and Plan:   NAFISA OLDS is a 87 y.o. y/o female has been referred for constipation  Patient has had some changes in her bowel movements compared to her baseline, but is not having any alarm symptoms.  She had about a 6-1/2 pound weight loss after the holidays, over a period of about 3 months.  However, she has good appetite, no nausea or vomiting, no blood in stool.  She is not having any diarrhea  Labs otherwise reassuring  In the absence of alarm symptoms, and 2 prior colonoscopies without any colon polyps, risk of underlying malignancy is low  Would recommend starting with Metamucil to help regulate bowels, and allow patient to have a moderate to large bowel movement once a day instead of the small bowel and she is having currently.  If this does not work, we may change medication therapy or consider colonoscopy if needed  However, due to her reported abdominal discomfort and associated weight loss, proceed with CT abdomen pelvis as it would allow [place] to evaluate for any underlying lesions which may or may not necessitate colonoscopy at her advanced age if abnormalities are present   Dr Laura [name]  Speech recognition software was used to dictate the above note.

## 2020-03-18 ENCOUNTER — Other Ambulatory Visit: Payer: Self-pay

## 2020-03-18 ENCOUNTER — Ambulatory Visit
Admission: RE | Admit: 2020-03-18 | Discharge: 2020-03-18 | Disposition: A | Discharge status: Home / Self Care | Payer: Medicare PPO | Source: Ambulatory Visit | Attending: Gastroenterology | Admitting: Gastroenterology

## 2020-03-18 DIAGNOSIS — R634 Abnormal weight loss: Secondary | ICD-10-CM | POA: Diagnosis not present

## 2020-03-18 DIAGNOSIS — R1084 Generalized abdominal pain: Secondary | ICD-10-CM | POA: Diagnosis not present

## 2020-03-18 DIAGNOSIS — I700 Atherosclerosis of aorta: Secondary | ICD-10-CM | POA: Diagnosis not present

## 2020-03-18 DIAGNOSIS — K5730 Diverticulosis of large intestine without perforation or abscess without bleeding: Secondary | ICD-10-CM | POA: Diagnosis not present

## 2020-03-18 DIAGNOSIS — Z90710 Acquired absence of both cervix and uterus: Secondary | ICD-10-CM | POA: Diagnosis not present

## 2020-03-18 LAB — POCT I-STAT CREATININE: Creatinine, Ser: 0.6 mg/dL (ref 0.44–1.00)

## 2020-03-18 MED ORDER — IOHEXOL 300 MG/ML  SOLN
100.0000 mL | Freq: Once | INTRAMUSCULAR | Status: AC | PRN
  Administered 2020-03-18: 100 mL via INTRAVENOUS

## 2020-03-25 ENCOUNTER — Other Ambulatory Visit: Payer: Self-pay

## 2020-03-25 ENCOUNTER — Ambulatory Visit
Admission: RE | Admit: 2020-03-25 | Discharge: 2020-03-25 | Disposition: A | Discharge status: Home / Self Care | Payer: Medicare PPO | Source: Ambulatory Visit | Attending: Internal Medicine | Admitting: Internal Medicine

## 2020-03-25 DIAGNOSIS — Z1231 Encounter for screening mammogram for malignant neoplasm of breast: Secondary | ICD-10-CM | POA: Diagnosis not present

## 2020-03-28 DIAGNOSIS — H353131 Nonexudative age-related macular degeneration, bilateral, early dry stage: Secondary | ICD-10-CM | POA: Diagnosis not present

## 2020-03-28 DIAGNOSIS — H35372 Puckering of macula, left eye: Secondary | ICD-10-CM | POA: Diagnosis not present

## 2020-03-28 DIAGNOSIS — Z0100 Encounter for examination of eyes and vision without abnormal findings: Secondary | ICD-10-CM | POA: Diagnosis not present

## 2020-04-06 ENCOUNTER — Ambulatory Visit: Payer: Medicare PPO | Admitting: Gastroenterology

## 2020-04-18 ENCOUNTER — Other Ambulatory Visit: Payer: Self-pay | Admitting: Internal Medicine

## 2020-05-02 ENCOUNTER — Encounter: Payer: Self-pay | Admitting: Internal Medicine

## 2020-05-02 ENCOUNTER — Ambulatory Visit: Payer: Medicare PPO | Admitting: Internal Medicine

## 2020-05-02 ENCOUNTER — Other Ambulatory Visit: Payer: Self-pay

## 2020-05-02 VITALS — BP 148/82 | HR 86 | Temp 98.2°F | Resp 15 | Ht 64.0 in | Wt 141.2 lb

## 2020-05-02 DIAGNOSIS — I700 Atherosclerosis of aorta: Secondary | ICD-10-CM | POA: Diagnosis not present

## 2020-05-02 DIAGNOSIS — E785 Hyperlipidemia, unspecified: Secondary | ICD-10-CM | POA: Diagnosis not present

## 2020-05-02 DIAGNOSIS — I10 Essential (primary) hypertension: Secondary | ICD-10-CM

## 2020-05-02 DIAGNOSIS — F5105 Insomnia due to other mental disorder: Secondary | ICD-10-CM

## 2020-05-02 DIAGNOSIS — F409 Phobic anxiety disorder, unspecified: Secondary | ICD-10-CM

## 2020-05-02 DIAGNOSIS — R198 Other specified symptoms and signs involving the digestive system and abdomen: Secondary | ICD-10-CM

## 2020-05-02 MED ORDER — ALPRAZOLAM 0.5 MG PO TABS: 0.2500 mg | ORAL_TABLET | Freq: Two times a day (BID) | ORAL | 0 refills | Status: DC | PRN

## 2020-05-02 MED ORDER — AMLODIPINE BESYLATE 2.5 MG PO TABS: 2.5000 mg | ORAL_TABLET | Freq: Every day | ORAL | 3 refills | Status: DC

## 2020-05-02 NOTE — Assessment & Plan Note (Addendum)
2.5 mg amlodipine added to 1/2 tablet maxzide.  Screening for secondary causes underway

## 2020-05-02 NOTE — Progress Notes (Signed)
Subjective:  Patient ID: Laura [name], female    DOB: 23-Apr-1932  Age: 87 y.o. MRN: [id number]  CC: The primary encounter diagnosis was Hyperlipidemia with target LDL less than 130. Diagnoses of Primary hypertension, Aortic atherosclerosis (HCC), Altered bowel function, Hyperlipidemia LDL goal <70, and Insomnia due to anxiety and fear were also pertinent to this visit.  HPI Laura [name] presents for follow up on multiple issues  This visit occurred during the SARS-CoV-2 public health emergency.  Safety protocols were in place, including screening questions prior to the visit, additional usage of staff PPE, and extensive cleaning of exam room while observing appropriate contact time as indicated for disinfecting solutions.   Patient reports a recent episode of sustained panic / increased anxiety,  With an episode described as a panic attack triggered by thinking about an upcoming  trip to [place] due to new of a COVID resurgence .  She has completed her second booster two days ago and feels somewhat better about travelling to [place] with her children but requesting a refill on the alprazolam rx to take with her.   HTN: Has been having elevated blood pressure readings since then as high as 170/90.  Does not want a beta blocker due to her late husband's signigicant lethargy attributed to the use of this medication.  She is medication compliant,  Salts her food occasionally ("I'm a southerner") told that she snores  Terribly, but has no apneic events reported so she has not had a  sleep study.  Has been taking 1/2 maxzide daily for a long time .  Discussed adding amlodipine since she is leaving for [place] in 3 days.   Constipation and bloating : reviewed GI workup.  She has changed eating pattern:  more fruit and fier has helped.   Reviewed findings of prior CT scan today..  Patient is tolerating pravastatin but willing to consider high potency statin pending results of next lipid panel.    Outpatient Medications Prior to Visit  Medication Sig Dispense Refill  . calcium carbonate (OS-CAL) 600 MG TABS Take 600 mg by mouth 2 (two) times daily with a meal.    . Cholecalciferol (VITAMIN D3) 50 MCG (2000 UT) CAPS Take 1 capsule by mouth daily.    . Coenzyme Q10 (CO Q 10) 100 MG CAPS Take 1 capsule by mouth daily.    . Cyanocobalamin 2500 MCG SUBL PLACE 1 TABLET UNDER THE TONGUE DAILY AS DIRECTED 90 tablet 1  . denosumab (PROLIA) 60 MG/ML SOLN injection Inject 60 mg into the skin every 6 (six) months. Administer in upper arm, thigh, or abdomen    . magnesium oxide (MAG-OX) 400 MG tablet Take 400 mg by mouth 2 (two) times daily.    . Multiple Vitamins-Minerals (PRESERVISION AREDS 2 PO) Take 1 capsule by mouth daily.    . nitroGLYCERIN (NITROSTAT) 0.4 MG SL tablet Place 1 tablet (0.4 mg total) under the tongue every 5 (five) minutes as needed for chest pain. 50 tablet 3  . pravastatin (PRAVACHOL) 40 MG tablet TAKE 1 TABLET BY MOUTH EVERY DAY 90 tablet 3  . psyllium (METAMUCIL) 58.6 % packet Take 1 packet by mouth daily.    . Riboflavin 100 MG CAPS Take 1 capsule by mouth 2 (two) times daily.     Marland Kitchen triamterene-hydrochlorothiazide (MAXZIDE-25) 37.5-25 MG tablet TAKE 1 TABLET BY MOUTH DAILY 90 tablet 1  . zolpidem (AMBIEN) 10 MG tablet TAKE 1 TABLET(10 MG) BY MOUTH AT BEDTIME 30 tablet 5  .  polyethylene glycol (MIRALAX / GLYCOLAX) 17 g packet Take 17 g by mouth daily. (Patient not taking: Reported on 05/02/2020)     Facility-Administered Medications Prior to Visit  Medication Dose Route Frequency Provider Last Rate Last Admin  . denosumab (PROLIA) injection 60 mg  60 mg Subcutaneous Once Sherlene Shams, MD        Review of Systems;  Patient denies headache, fevers, malaise, unintentional weight loss, skin rash, eye pain, sinus congestion and sinus pain, sore throat, dysphagia,  hemoptysis , cough, dyspnea, wheezing, chest pain, palpitations, orthopnea, edema, abdominal pain, nausea,  melena, diarrhea, constipation, flank pain, dysuria, hematuria, urinary  Frequency, nocturia, numbness, tingling, seizures,  Focal weakness, Loss of consciousness,  Tremor, insomnia, depression, anxiety, and suicidal ideation.      Objective:  BP (!) 148/82 (BP Location: Left Arm, Patient Position: Sitting, Cuff Size: Normal)   Pulse 86   Temp 98.2 F (36.8 C) (Oral)   Resp 15   Ht 5\' 4"  (1.626 m)   Wt 141 lb 3.2 oz (64 kg)   SpO2 99%   BMI 24.24 kg/m   BP Readings from Last 3 Encounters:  05/02/20 (!) 148/82  03/02/20 (!) 144/84  02/03/20 130/74    Wt Readings from Last 3 Encounters:  05/02/20 141 lb 3.2 oz (64 kg)  03/02/20 144 lb (65.3 kg)  02/23/20 144 lb (65.3 kg)    General appearance: alert, cooperative and appears stated age Ears: normal TM's and external ear canals both ears Throat: lips, mucosa, and tongue normal; teeth and gums normal Neck: no adenopathy, no carotid bruit, supple, symmetrical, trachea midline and thyroid not enlarged, symmetric, no tenderness/mass/nodules Back: symmetric, no curvature. ROM normal. No CVA tenderness. Lungs: clear to auscultation bilaterally Heart: regular rate and rhythm, S1, S2 normal, no murmur, click, rub or gallop Abdomen: soft, non-tender; bowel sounds normal; no masses,  no organomegaly Pulses: 2+ and symmetric Skin: Skin color, texture, turgor normal. No rashes or lesions Lymph nodes: Cervical, supraclavicular, and axillary nodes normal.  Lab Results  Component Value Date   HGBA1C 5.6 02/10/2019   HGBA1C 5.4 05/05/2014    Lab Results  Component Value Date   CREATININE 0.60 03/18/2020   CREATININE 0.63 02/03/2020   CREATININE 0.75 11/04/2019    Lab Results  Component Value Date   WBC 7.3 02/03/2020   HGB 13.7 02/03/2020   HCT 41.4 02/03/2020   PLT 297.0 02/03/2020   GLUCOSE 85 02/03/2020   CHOL 205 (H) 02/10/2019   TRIG 79.0 02/10/2019   HDL 88.90 02/10/2019   LDLDIRECT 109.7 09/27/2011   LDLCALC 100 (H)  02/10/2019   ALT 13 02/03/2020   AST 15 02/03/2020   NA 137 02/03/2020   K 3.8 02/03/2020   CL 100 02/03/2020   CREATININE 0.60 03/18/2020   BUN 11 02/03/2020   CO2 28 02/03/2020   TSH 2.23 02/03/2020   HGBA1C 5.6 02/10/2019    MM 3D SCREEN BREAST BILATERAL  Result Date: 03/29/2020 CLINICAL DATA:  Screening. EXAM: DIGITAL SCREENING BILATERAL MAMMOGRAM WITH TOMOSYNTHESIS AND CAD TECHNIQUE: Bilateral screening digital craniocaudal and mediolateral oblique mammograms were obtained. Bilateral screening digital breast tomosynthesis was performed. The images were evaluated with computer-aided detection. COMPARISON:  Previous exam(s). ACR Breast Density Category b: There are scattered areas of fibroglandular density. FINDINGS: There are no findings suspicious for malignancy. The images were evaluated with computer-aided detection. IMPRESSION: No mammographic evidence of malignancy. A result letter of this screening mammogram will be mailed directly to the patient.  RECOMMENDATION: Screening mammogram in one year. (Code:SM-B-01Y) BI-RADS CATEGORY  1: Negative. Electronically Signed   By: Baird Lyons M.D.   On: 03/29/2020 09:56    Assessment & Plan:   Problem List Items Addressed This Visit      Unprioritized   Altered bowel function    Improved with increased natural fiber sources.       Aortic atherosclerosis (HCC)    Reviewed findings of prior CT scan today..  Patient is tolerating pravastatin but will consider switch to  high potency statin therapy       Relevant Medications   amLODipine (NORVASC) 2.5 MG tablet   HTN (hypertension)    2.5 mg amlodipine added to 1/2 tablet maxzide.  Screening for secondary causes underway      Relevant Medications   amLODipine (NORVASC) 2.5 MG tablet   Other Relevant Orders   Microalbumin / creatinine urine ratio   Hyperlipidemia LDL goal <70 - Primary    New goal is < 70 given aortic atherosclerosis on CT       Relevant Medications   amLODipine  (NORVASC) 2.5 MG tablet   Insomnia due to anxiety and fear    Alprazolam refilled for prn use.  The risks and benefits of benzodiazepine use were discussed with patient today including excessive sedation leading to respiratory depression,  impaired thinking/driving, and addiction.  Patient was advised to avoid concurrent use with alcohol, to use medication only as needed and not to share with others  .           I have discontinued Maddison J. Harney "B Gillock"'s polyethylene glycol. I have also changed her ALPRAZolam. Additionally, I am having her start on amLODipine. Lastly, I am having her maintain her calcium carbonate, Riboflavin, Co Q 10, denosumab, magnesium oxide, nitroGLYCERIN, Cyanocobalamin, Multiple Vitamins-Minerals (PRESERVISION AREDS 2 PO), vitamin D3, zolpidem, pravastatin, triamterene-hydrochlorothiazide, and psyllium. We will continue to administer denosumab.  Meds ordered this encounter  Medications  . amLODipine (NORVASC) 2.5 MG tablet    Sig: Take 1 tablet (2.5 mg total) by mouth daily.    Dispense:  90 tablet    Refill:  3  . ALPRAZolam (XANAX) 0.5 MG tablet    Sig: Take 0.5 tablets (0.25 mg total) by mouth 2 (two) times daily as needed for anxiety or sleep.    Dispense:  30 tablet    Refill:  0   I provided  A total of 30 minutes . More than half of which was   face-to-face time during this encounter reviewing patient's current problems and past surgeries, labs and imaging studies, providing counseling on her need to reduce the salt in her diet,  The signficance of the aortic atherosclerosis,  management of her anxiety  During her upcoming trip to [place], and coordination  of care .  Medications Discontinued During This Encounter  Medication Reason  . polyethylene glycol (MIRALAX / GLYCOLAX) 17 g packet     Follow-up: No follow-ups on file.   Sherlene Shams, MD

## 2020-05-02 NOTE — Patient Instructions (Addendum)
Adding 2.5 mg amlodipine for elevated blood pressure.  You may double your dose if your BP is still elevated to SBP > 150.   Continue 1/2 tablet maxzide.     Alprazolam refilled; can be used for panic attacks   Fasting labs prior to your May 5 visit.

## 2020-05-02 NOTE — Assessment & Plan Note (Signed)
Reviewed findings of prior CT scan today..  Patient is tolerating pravastatin but will consider switch to  high potency statin therapy

## 2020-05-03 ENCOUNTER — Encounter: Payer: Self-pay | Admitting: Internal Medicine

## 2020-05-03 NOTE — Assessment & Plan Note (Signed)
New goal is < 70 given aortic atherosclerosis on CT

## 2020-05-03 NOTE — Assessment & Plan Note (Signed)
Alprazolam refilled for prn use.  The risks and benefits of benzodiazepine use were discussed with patient today including excessive sedation leading to respiratory depression,  impaired thinking/driving, and addiction.  Patient was advised to avoid concurrent use with alcohol, to use medication only as needed and not to share with others  .

## 2020-05-03 NOTE — Assessment & Plan Note (Signed)
Improved with increased natural fiber sources.

## 2020-05-17 ENCOUNTER — Other Ambulatory Visit: Payer: Self-pay | Admitting: Internal Medicine

## 2020-05-24 MED ORDER — AMLODIPINE BESYLATE 2.5 MG PO TABS: 2.5000 mg | ORAL_TABLET | Freq: Every day | ORAL | 0 refills | Status: DC

## 2020-05-31 ENCOUNTER — Encounter: Payer: Self-pay | Admitting: Gastroenterology

## 2020-05-31 ENCOUNTER — Other Ambulatory Visit: Payer: Self-pay

## 2020-05-31 ENCOUNTER — Ambulatory Visit: Payer: Medicare PPO | Admitting: Gastroenterology

## 2020-05-31 VITALS — BP 137/76 | HR 85 | Temp 98.5°F | Ht 64.0 in | Wt 143.0 lb

## 2020-05-31 DIAGNOSIS — K5900 Constipation, unspecified: Secondary | ICD-10-CM

## 2020-05-31 NOTE — Progress Notes (Signed)
Laura Bouillon, MD [address]  [address]  [place], [place] [address]  Main: [phone]  Fax: [phone]   Primary Care Physician: Sherlene Shams, MD   Chief Complaint  Patient presents with  . Abdominal Pain    HPI: Laura [name] is a 87 y.o. female here for follow up.of constipation.  Patient states symptoms are much improved with Metamucil.  She is taking it once daily.  Is having a bowel movement daily.  Abdominal pain has resolved.  Is also using prune juice as needed.  No blood in stool.  Weights trended since January 2022 and have been in the 140 lb range and stable.  No dysphagia.  No nausea or vomiting.  Reports great appetite.  Current Outpatient Medications  Medication Sig Dispense Refill  . amLODipine (NORVASC) 2.5 MG tablet Take 1 tablet (2.5 mg total) by mouth daily. 30 tablet 0  . calcium carbonate (OS-CAL) 600 MG TABS Take 600 mg by mouth 2 (two) times daily with a meal.    . Cholecalciferol (VITAMIN D3) 50 MCG (2000 UT) CAPS Take 1 capsule by mouth daily.    . Cyanocobalamin 2500 MCG SUBL PLACE 1 TABLET UNDER THE TONGUE DAILY AS DIRECTED 90 tablet 1  . denosumab (PROLIA) 60 MG/ML SOLN injection Inject 60 mg into the skin every 6 (six) months. Administer in upper arm, thigh, or abdomen    . magnesium oxide (MAG-OX) 400 MG tablet Take 400 mg by mouth 2 (two) times daily.    . Multiple Vitamins-Minerals (PRESERVISION AREDS 2 PO) Take 1 capsule by mouth daily.    . pravastatin (PRAVACHOL) 40 MG tablet TAKE 1 TABLET BY MOUTH EVERY DAY 90 tablet 3  . psyllium (METAMUCIL) 58.6 % packet Take 1 packet by mouth daily.    . Riboflavin 100 MG CAPS Take 1 capsule by mouth 2 (two) times daily.     Marland Kitchen triamterene-hydrochlorothiazide (MAXZIDE-25) 37.5-25 MG tablet TAKE 1 TABLET BY MOUTH DAILY 90 tablet 1  . zolpidem (AMBIEN) 10 MG tablet TAKE 1 TABLET(10 MG) BY MOUTH AT BEDTIME 30 tablet 5  . ALPRAZolam (XANAX) 0.5 MG tablet Take 0.5 tablets (0.25 mg total) by  mouth 2 (two) times daily as needed for anxiety or sleep. (Patient not taking: Reported on 05/31/2020) 30 tablet 0  . Coenzyme Q10 (CO Q 10) 100 MG CAPS Take 1 capsule by mouth daily. (Patient not taking: Reported on 05/31/2020)    . nitroGLYCERIN (NITROSTAT) 0.4 MG SL tablet Place 1 tablet (0.4 mg total) under the tongue every 5 (five) minutes as needed for chest pain. (Patient not taking: Reported on 05/31/2020) 50 tablet 3   Current Facility-Administered Medications  Medication Dose Route Frequency Provider Last Rate Last Admin  . denosumab (PROLIA) injection 60 mg  60 mg Subcutaneous Once Sherlene Shams, MD        Allergies as of 05/31/2020 - Review Complete 05/31/2020  Allergen Reaction Noted  . Erythromycin  03/21/2011    ROS:  General: Negative for anorexia, weight loss, fever, chills, fatigue, weakness. ENT: Negative for hoarseness, difficulty swallowing , nasal congestion. CV: Negative for chest pain, angina, palpitations, dyspnea on exertion, peripheral edema.  Respiratory: Negative for dyspnea at rest, dyspnea on exertion, cough, sputum, wheezing.  GI: See history of present illness. GU:  Negative for dysuria, hematuria, urinary incontinence, urinary frequency, nocturnal urination.  Endo: Negative for unusual weight change.    Physical Examination:   BP 137/76   Pulse 85  Temp 98.5 F (36.9 C) (Oral)   Ht 5\' 4"  (1.626 m)   Wt 143 lb (64.9 kg)   BMI 24.55 kg/m   General: Well-nourished, well-developed in no acute distress.  Eyes: No icterus. Conjunctivae pink. Mouth: Oropharyngeal mucosa moist and pink , no lesions erythema or exudate. Neck: Supple, Trachea midline Abdomen: Bowel sounds are normal, nontender, nondistended, no hepatosplenomegaly or masses, no abdominal bruits or hernia , no rebound or guarding.   Rectal exam: No perianal lesions present, rectal vault with no masses or abnormalities present, yellow stool in rectal vault Extremities: No lower extremity  edema. No clubbing or deformities. Neuro: Alert and oriented x 3.  Grossly intact. Skin: Warm and dry, no jaundice.   Psych: Alert and cooperative, normal mood and affect.   Labs: CMP     Component Value Date/Time   NA 137 02/03/2020 1231   K 3.8 02/03/2020 1231   CL 100 02/03/2020 1231   CO2 28 02/03/2020 1231   GLUCOSE 85 02/03/2020 1231   BUN 11 02/03/2020 1231   CREATININE 0.60 03/18/2020 0828   CALCIUM 9.2 02/03/2020 1231   PROT 6.9 02/03/2020 1231   ALBUMIN 4.6 02/03/2020 1231   AST 15 02/03/2020 1231   ALT 13 02/03/2020 1231   ALKPHOS 49 02/03/2020 1231   BILITOT 0.5 02/03/2020 1231   GFRNONAA >60 10/10/2014 1845   GFRAA >60 10/10/2014 1845   Lab Results  Component Value Date   WBC 7.3 02/03/2020   HGB 13.7 02/03/2020   HCT 41.4 02/03/2020   MCV 87.8 02/03/2020   PLT 297.0 02/03/2020    Imaging Studies: CT scan with no masses or acute abnormalities  Assessment and Plan:   Laura [name] is a 87 y.o. y/o female here for follow-up of constipation  Symptoms resolved with Metamucil once daily, prune juice and high-fiber diet  No alarm symptoms present  See previous colonoscopies noted on previous progress notes for details  If symptoms change or recur, patient was advised to call [phone] back and she verbalized understanding  Patient states she is very reassured with the resolution in her symptoms and is happy with her improvement and would like to follow-up as needed  CT scan was reassuring as well  Dr [place]

## 2020-06-01 ENCOUNTER — Other Ambulatory Visit (INDEPENDENT_AMBULATORY_CARE_PROVIDER_SITE_OTHER): Payer: Medicare PPO

## 2020-06-01 DIAGNOSIS — E785 Hyperlipidemia, unspecified: Secondary | ICD-10-CM

## 2020-06-01 DIAGNOSIS — I10 Essential (primary) hypertension: Secondary | ICD-10-CM | POA: Diagnosis not present

## 2020-06-01 LAB — LIPID PANEL
Cholesterol: 195 mg/dL (ref 0–200)
HDL: 86.8 mg/dL (ref >39.00)
LDL Cholesterol: 92 mg/dL (ref 0–99)
NonHDL: 108.19
Total CHOL/HDL Ratio: 2
Triglycerides: 81 mg/dL (ref 0.0–149.0)
VLDL: 16.2 mg/dL (ref 0.0–40.0)

## 2020-06-01 LAB — COMPREHENSIVE METABOLIC PANEL
ALT: 14 U/L (ref 0–35)
AST: 16 U/L (ref 0–37)
Albumin: 4.3 g/dL (ref 3.5–5.2)
Alkaline Phosphatase: 51 U/L (ref 39–117)
BUN: 12 mg/dL (ref 6–23)
CO2: 29 mEq/L (ref 19–32)
Calcium: 9.5 mg/dL (ref 8.4–10.5)
Chloride: 101 mEq/L (ref 96–112)
Creatinine, Ser: 0.67 mg/dL (ref 0.40–1.20)
GFR: 79.62 mL/min (ref >60.00)
Glucose, Bld: 102 mg/dL — ABNORMAL HIGH (ref 70–99)
Potassium: 4 mEq/L (ref 3.5–5.1)
Sodium: 138 mEq/L (ref 135–145)
Total Bilirubin: 0.5 mg/dL (ref 0.2–1.2)
Total Protein: 6.6 g/dL (ref 6.0–8.3)

## 2020-06-01 LAB — MICROALBUMIN / CREATININE URINE RATIO
Creatinine,U: 20.2 mg/dL
Microalb Creat Ratio: 3.5 mg/g (ref 0.0–30.0)
Microalb, Ur: <0.7 mg/dL (ref 0.0–1.9)

## 2020-06-02 ENCOUNTER — Ambulatory Visit: Payer: Medicare PPO | Admitting: Internal Medicine

## 2020-06-02 ENCOUNTER — Encounter: Payer: Self-pay | Admitting: Internal Medicine

## 2020-06-02 ENCOUNTER — Other Ambulatory Visit: Payer: Self-pay

## 2020-06-02 ENCOUNTER — Ambulatory Visit: Payer: Medicare PPO

## 2020-06-02 VITALS — BP 120/62 | HR 92 | Temp 97.3°F | Resp 15 | Ht 64.0 in | Wt 143.0 lb

## 2020-06-02 DIAGNOSIS — R42 Dizziness and giddiness: Secondary | ICD-10-CM

## 2020-06-02 DIAGNOSIS — R269 Unspecified abnormalities of gait and mobility: Secondary | ICD-10-CM | POA: Diagnosis not present

## 2020-06-02 DIAGNOSIS — E785 Hyperlipidemia, unspecified: Secondary | ICD-10-CM

## 2020-06-02 DIAGNOSIS — M810 Age-related osteoporosis without current pathological fracture: Secondary | ICD-10-CM | POA: Diagnosis not present

## 2020-06-02 DIAGNOSIS — F5105 Insomnia due to other mental disorder: Secondary | ICD-10-CM

## 2020-06-02 DIAGNOSIS — F409 Phobic anxiety disorder, unspecified: Secondary | ICD-10-CM | POA: Diagnosis not present

## 2020-06-02 DIAGNOSIS — I10 Essential (primary) hypertension: Secondary | ICD-10-CM | POA: Diagnosis not present

## 2020-06-02 DIAGNOSIS — F411 Generalized anxiety disorder: Secondary | ICD-10-CM

## 2020-06-02 MED ORDER — ESCITALOPRAM OXALATE 10 MG PO TABS: 10.0000 mg | ORAL_TABLET | Freq: Every day | ORAL | 0 refills | Status: DC

## 2020-06-02 MED ORDER — ZOLPIDEM TARTRATE 10 MG PO TABS: ORAL_TABLET | ORAL | 5 refills | Status: DC

## 2020-06-02 MED ORDER — DENOSUMAB 60 MG/ML ~~LOC~~ SOSY
60.0000 mg | PREFILLED_SYRINGE | Freq: Once | SUBCUTANEOUS | Status: AC
  Administered 2020-06-02: 60 mg via SUBCUTANEOUS

## 2020-06-02 MED ORDER — ATORVASTATIN CALCIUM 20 MG PO TABS: 20.0000 mg | ORAL_TABLET | Freq: Every day | ORAL | 3 refills | Status: DC

## 2020-06-02 NOTE — Progress Notes (Signed)
Subjective:  Patient ID: Laura [name], female    DOB: 1932/06/06  Age: 87 y.o. MRN: [id number]  CC: The primary encounter diagnosis was Dizziness. Diagnoses of Age-related osteoporosis without current pathological fracture, Insomnia due to anxiety and fear, Gait disorder, Primary hypertension, Hyperlipidemia LDL goal <70, and Anxiety state were also pertinent to this visit.  HPI Laura [name] presents for follow up  This visit occurred during the SARS-CoV-2 public health emergency.  Safety protocols were in place, including screening questions prior to the visit, additional usage of staff PPE, and extensive cleaning of exam room while observing appropriate contact time as indicated for disinfecting solutions.    She travelled to [place] with her daughters.  While In [place] she noted recurrent "listing" to the right during walking tours.  Denies dizziness during yoga . Wants a referral  to daughter's vestibular rehab [facility] in [place] .  Ph     [phone]    Hypertension:    Patient is taking amlodipine  as prescribed and notes no adverse effects.  Home BP readings have been done once or twice daily and are  < 140/80 .  She is avoiding added salt in her diet and participating aily in exercise    Anxiety /panic attacks infrequent but raising blood pressure at times.   Bothered by report of atherosclerosis  Insomnia:  Refilled ambien   PROLIA INJECTION GIVEN TODAY     Outpatient Medications Prior to Visit  Medication Sig Dispense Refill  . ALPRAZolam (XANAX) 0.5 MG tablet Take 0.5 tablets (0.25 mg total) by mouth 2 (two) times daily as needed for anxiety or sleep. 30 tablet 0  . amLODipine (NORVASC) 2.5 MG tablet Take 1 tablet (2.5 mg total) by mouth daily. 30 tablet 0  . calcium carbonate (OS-CAL) 600 MG TABS Take 600 mg by mouth 2 (two) times daily with a meal.    . Cholecalciferol (VITAMIN D3) 50 MCG (2000 UT) CAPS Take 1 capsule by mouth daily.    . Coenzyme  Q10 (CO Q 10) 100 MG CAPS Take 1 capsule by mouth daily.    . Cyanocobalamin 2500 MCG SUBL PLACE 1 TABLET UNDER THE TONGUE DAILY AS DIRECTED 90 tablet 1  . denosumab (PROLIA) 60 MG/ML SOLN injection Inject 60 mg into the skin every 6 (six) months. Administer in upper arm, thigh, or abdomen    . magnesium oxide (MAG-OX) 400 MG tablet Take 400 mg by mouth 2 (two) times daily.    . Multiple Vitamins-Minerals (PRESERVISION AREDS 2 PO) Take 1 capsule by mouth daily.    . nitroGLYCERIN (NITROSTAT) 0.4 MG SL tablet Place 1 tablet (0.4 mg total) under the tongue every 5 (five) minutes as needed for chest pain. 50 tablet 3  . psyllium (METAMUCIL) 58.6 % packet Take 1 packet by mouth daily.    . Riboflavin 100 MG CAPS Take 1 capsule by mouth 2 (two) times daily.     Marland Kitchen triamterene-hydrochlorothiazide (MAXZIDE-25) 37.5-25 MG tablet TAKE 1 TABLET BY MOUTH DAILY 90 tablet 1  . pravastatin (PRAVACHOL) 40 MG tablet TAKE 1 TABLET BY MOUTH EVERY DAY 90 tablet 3  . zolpidem (AMBIEN) 10 MG tablet TAKE 1 TABLET(10 MG) BY MOUTH AT BEDTIME 30 tablet 5   Facility-Administered Medications Prior to Visit  Medication Dose Route Frequency Provider Last Rate Last Admin  . denosumab (PROLIA) injection 60 mg  60 mg Subcutaneous Once Sherlene Shams, MD  Review of Systems;  Patient denies headache, fevers, malaise, unintentional weight loss, skin rash, eye pain, sinus congestion and sinus pain, sore throat, dysphagia,  hemoptysis , cough, dyspnea, wheezing, chest pain, palpitations, orthopnea, edema, abdominal pain, nausea, melena, diarrhea, constipation, flank pain, dysuria, hematuria, urinary  Frequency, nocturia, numbness, tingling, seizures,  Focal weakness, Loss of consciousness,  Tremor, insomnia, depression, anxiety, and suicidal ideation.      Objective:  BP 120/62 (BP Location: Left Arm, Patient Position: Sitting, Cuff Size: Normal)   Pulse 92   Temp (!) 97.3 F (36.3 C) (Temporal)   Resp 15   Ht 5\' 4"   (1.626 m)   Wt 143 lb (64.9 kg)   SpO2 97%   BMI 24.55 kg/m   BP Readings from Last 3 Encounters:  06/02/20 120/62  05/31/20 137/76  05/02/20 (!) 148/82    Wt Readings from Last 3 Encounters:  06/02/20 143 lb (64.9 kg)  05/31/20 143 lb (64.9 kg)  05/02/20 141 lb 3.2 oz (64 kg)    General appearance: alert, cooperative and appears stated age Ears: normal TM's and external ear canals both ears Throat: lips, mucosa, and tongue normal; teeth and gums normal Neck: no adenopathy, no carotid bruit, supple, symmetrical, trachea midline and thyroid not enlarged, symmetric, no tenderness/mass/nodules Back: symmetric, no curvature. ROM normal. No CVA tenderness. Lungs: clear to auscultation bilaterally Heart: regular rate and rhythm, S1, S2 normal, no murmur, click, rub or gallop Abdomen: soft, non-tender; bowel sounds normal; no masses,  no organomegaly Pulses: 2+ and symmetric Skin: Skin color, texture, turgor normal. No rashes or lesions Lymph nodes: Cervical, supraclavicular, and axillary nodes normal. Neuro:  awake and interactive with normal mood and affect. Higher cortical functions are normal. Speech is clear without word-finding difficulty or dysarthria. Extraocular movements are intact. Visual fields of both eyes are grossly intact. Sensation to light touch is grossly intact bilaterally of upper and lower extremities. Motor examination shows 4+/5 symmetric hand grip and upper extremity and 5/5 lower extremity strength. There is no pronation or drift. Gait is non-ataxic    Lab Results  Component Value Date   HGBA1C 5.6 02/10/2019   HGBA1C 5.4 05/05/2014    Lab Results  Component Value Date   CREATININE 0.67 06/01/2020   CREATININE 0.60 03/18/2020   CREATININE 0.63 02/03/2020    Lab Results  Component Value Date   WBC 7.3 02/03/2020   HGB 13.7 02/03/2020   HCT 41.4 02/03/2020   PLT 297.0 02/03/2020   GLUCOSE 102 (H) 06/01/2020   CHOL 195 06/01/2020   TRIG 81.0  06/01/2020   HDL 86.80 06/01/2020   LDLDIRECT 109.7 09/27/2011   LDLCALC 92 06/01/2020   ALT 14 06/01/2020   AST 16 06/01/2020   NA 138 06/01/2020   K 4.0 06/01/2020   CL 101 06/01/2020   CREATININE 0.67 06/01/2020   BUN 12 06/01/2020   CO2 29 06/01/2020   TSH 2.23 02/03/2020   HGBA1C 5.6 02/10/2019   MICROALBUR <0.7 06/01/2020    MM 3D SCREEN BREAST BILATERAL  Result Date: 03/29/2020 CLINICAL DATA:  Screening. EXAM: DIGITAL SCREENING BILATERAL MAMMOGRAM WITH TOMOSYNTHESIS AND CAD TECHNIQUE: Bilateral screening digital craniocaudal and mediolateral oblique mammograms were obtained. Bilateral screening digital breast tomosynthesis was performed. The images were evaluated with computer-aided detection. COMPARISON:  Previous exam(s). ACR Breast Density Category b: There are scattered areas of fibroglandular density. FINDINGS: There are no findings suspicious for malignancy. The images were evaluated with computer-aided detection. IMPRESSION: No mammographic evidence of malignancy. A  result letter of this screening mammogram will be mailed directly to the patient. RECOMMENDATION: Screening mammogram in one year. (Code:SM-B-01Y) BI-RADS CATEGORY  1: Negative. Electronically Signed   By: Baird Lyonsina  Arceo M.D.   On: 03/29/2020 09:56    Assessment & Plan:   Problem List Items Addressed This Visit      Unprioritized   Osteoporosis    T scores have improved into the osteopenia range.  Continue Prolia , injection done today       Hyperlipidemia LDL goal <70    Not at goal.  Will increase pravastatin to 80  Mg until supplies last,  Then start atorvastaitn  Lab Results  Component Value Date   CHOL 195 06/01/2020   HDL 86.80 06/01/2020   LDLCALC 92 06/01/2020   LDLDIRECT 109.7 09/27/2011   TRIG 81.0 06/01/2020   CHOLHDL 2 06/01/2020         Relevant Medications   atorvastatin (LIPITOR) 20 MG tablet   Insomnia due to anxiety and fear    I have reviewed the dangers of prescribing this drug  to the elderly with patient.  [place] over two years, and prior attempts to use an alternative medication  have been unsuccessful resulting in loss of sleep..She is willing to accept the risks of adverse events. Refilled today       Anxiety state    Recent episodes of panic attacks with underlying anxiety about health.  lexapro prescribed,  Prn alprazolam      Relevant Medications   escitalopram (LEXAPRO) 10 MG tablet   HTN (hypertension)    Well controlled on low dose amlodipine . Home readings reviewed and discussed.  Reminded that due to her age,  Rennis Chrisachieiving a bp of 120/70 may be harmful.  Renal function stable, no changes today.  Lab Results  Component Value Date   CREATININE 0.67 06/01/2020   Lab Results  Component Value Date   NA 138 06/01/2020   K 4.0 06/01/2020   CL 101 06/01/2020   CO2 29 06/01/2020         Relevant Medications   atorvastatin (LIPITOR) 20 MG tablet   Gait disorder    Likely due to relative weakness of left leg due to hip pain.  But agree with  Referral to vestibular PT       RESOLVED: Dizziness - Primary   Relevant Orders   Ambulatory referral to Physical Therapy     I provided  30 minutes of  face-to-face time during this encounter reviewing and addresseing patient's current anxiety issues,  Blood pressures readings,  Loss of balance, and recent labs , and coordination  of care .   I have discontinued Alleah J. Yau "B Wigle"'s pravastatin. I am also having her start on atorvastatin and escitalopram. Additionally, I am having her maintain her calcium carbonate, Riboflavin, Co Q 10, denosumab, magnesium oxide, nitroGLYCERIN, Cyanocobalamin, Multiple Vitamins-Minerals (PRESERVISION AREDS 2 PO), vitamin D3, triamterene-hydrochlorothiazide, psyllium, ALPRAZolam, amLODipine, and zolpidem. We administered denosumab. We will continue to administer denosumab.  Meds ordered this encounter  Medications  . atorvastatin (LIPITOR)  20 MG tablet    Sig: Take 1 tablet (20 mg total) by mouth daily.    Dispense:  90 tablet    Refill:  3    Replacing pravastatin  . escitalopram (LEXAPRO) 10 MG tablet    Sig: Take 1 tablet (10 mg total) by mouth daily.    Dispense:  90 tablet    Refill:  0  .  zolpidem (AMBIEN) 10 MG tablet    Sig: TAKE 1 TABLET(10 MG) BY MOUTH AT BEDTIME    Dispense:  30 tablet    Refill:  5    KEEP ON FILE FOR FUTURE REFILLS  . denosumab (PROLIA) injection 60 mg    Medications Discontinued During This Encounter  Medication Reason  . pravastatin (PRAVACHOL) 40 MG tablet   . zolpidem (AMBIEN) 10 MG tablet Reorder    Follow-up: Return in about 6 months (around 12/03/2020).   Sherlene Shams, MD

## 2020-06-02 NOTE — Patient Instructions (Addendum)
Increase your pravastatin to 2 tablets daily until gone .  Then start the atorvastatin ,  One tablet daily ,  For goal LDL of 70  I  Recommend Please start the Lexapro (escitalopram) at 1/2 tablet daily after breakfast for the first few days to avoid nausea.  You can increase to a full tablet after 6 days if you havenot developed side effects of nausea.   Please return in  4 weeks ,  Or e mail me to let me know how it is helping your anxiety

## 2020-06-03 ENCOUNTER — Ambulatory Visit: Payer: Medicare PPO | Admitting: Internal Medicine

## 2020-06-04 DIAGNOSIS — R269 Unspecified abnormalities of gait and mobility: Secondary | ICD-10-CM | POA: Insufficient documentation

## 2020-06-04 NOTE — Assessment & Plan Note (Signed)
Well controlled on low dose amlodipine . Home readings reviewed and discussed.  Reminded that due to her age,  Laura [name] a bp of 120/70 may be harmful.  Renal function stable, no changes today.  Lab Results  Component Value Date   CREATININE 0.67 06/01/2020   Lab Results  Component Value Date   NA 138 06/01/2020   K 4.0 06/01/2020   CL 101 06/01/2020   CO2 29 06/01/2020

## 2020-06-04 NOTE — Assessment & Plan Note (Signed)
Recent episodes of panic attacks with underlying anxiety about health.  lexapro prescribed,  Prn alprazolam

## 2020-06-04 NOTE — Assessment & Plan Note (Signed)
Likely due to relative weakness of left leg due to hip pain.  But agree with  Referral to vestibular PT

## 2020-06-04 NOTE — Assessment & Plan Note (Signed)
Not at goal.  Will increase pravastatin to 80  Mg until supplies last,  Then start atorvastaitn  Lab Results  Component Value Date   CHOL 195 06/01/2020   HDL 86.80 06/01/2020   LDLCALC 92 06/01/2020   LDLDIRECT 109.7 09/27/2011   TRIG 81.0 06/01/2020   CHOLHDL 2 06/01/2020

## 2020-06-04 NOTE — Assessment & Plan Note (Signed)
I have reviewed the dangers of prescribing this drug to the elderly with patient.  She has been [place] dependent for over two years, and prior attempts to use an alternative medication  have been unsuccessful resulting in loss of sleep..She is willing to accept the risks of adverse events. Refilled today

## 2020-06-04 NOTE — Assessment & Plan Note (Signed)
T scores have improved into the osteopenia range.  Continue Prolia , injection done today

## 2020-06-06 ENCOUNTER — Other Ambulatory Visit: Payer: Self-pay

## 2020-06-06 MED ORDER — AMLODIPINE BESYLATE 2.5 MG PO TABS: 2.5000 mg | ORAL_TABLET | Freq: Every day | ORAL | 1 refills | Status: DC

## 2020-06-07 DIAGNOSIS — H8111 Benign paroxysmal vertigo, right ear: Secondary | ICD-10-CM | POA: Diagnosis not present

## 2020-06-09 DIAGNOSIS — H8111 Benign paroxysmal vertigo, right ear: Secondary | ICD-10-CM | POA: Diagnosis not present

## 2020-06-17 ENCOUNTER — Emergency Department: Payer: Medicare PPO

## 2020-06-17 ENCOUNTER — Encounter: Payer: Self-pay | Admitting: Emergency Medicine

## 2020-06-17 ENCOUNTER — Emergency Department
Admission: EM | Admit: 2020-06-17 | Discharge: 2020-06-17 | Disposition: A | Discharge status: Home / Self Care | Payer: Medicare PPO | Attending: Emergency Medicine | Admitting: Emergency Medicine

## 2020-06-17 ENCOUNTER — Other Ambulatory Visit: Payer: Self-pay

## 2020-06-17 DIAGNOSIS — G43109 Migraine with aura, not intractable, without status migrainosus: Secondary | ICD-10-CM

## 2020-06-17 DIAGNOSIS — G43809 Other migraine, not intractable, without status migrainosus: Secondary | ICD-10-CM | POA: Insufficient documentation

## 2020-06-17 DIAGNOSIS — I10 Essential (primary) hypertension: Secondary | ICD-10-CM | POA: Insufficient documentation

## 2020-06-17 DIAGNOSIS — Z79899 Other long term (current) drug therapy: Secondary | ICD-10-CM | POA: Insufficient documentation

## 2020-06-17 DIAGNOSIS — R29818 Other symptoms and signs involving the nervous system: Secondary | ICD-10-CM | POA: Diagnosis not present

## 2020-06-17 DIAGNOSIS — R130 Aphagia: Secondary | ICD-10-CM

## 2020-06-17 DIAGNOSIS — G930 Cerebral cysts: Secondary | ICD-10-CM | POA: Diagnosis not present

## 2020-06-17 DIAGNOSIS — Z87891 Personal history of nicotine dependence: Secondary | ICD-10-CM | POA: Insufficient documentation

## 2020-06-17 DIAGNOSIS — R4701 Aphasia: Secondary | ICD-10-CM | POA: Diagnosis not present

## 2020-06-17 DIAGNOSIS — G43909 Migraine, unspecified, not intractable, without status migrainosus: Secondary | ICD-10-CM | POA: Diagnosis not present

## 2020-06-17 DIAGNOSIS — I6782 Cerebral ischemia: Secondary | ICD-10-CM | POA: Diagnosis not present

## 2020-06-17 LAB — DIFFERENTIAL
Abs Immature Granulocytes: 0.04 10*3/uL (ref 0.00–0.07)
Basophils Absolute: 0 10*3/uL (ref 0.0–0.1)
Basophils Relative: 0 %
Eosinophils Absolute: 0.2 10*3/uL (ref 0.0–0.5)
Eosinophils Relative: 2 %
Immature Granulocytes: 0 %
Lymphocytes Relative: 21 %
Lymphs Abs: 1.9 10*3/uL (ref 0.7–4.0)
Monocytes Absolute: 0.4 10*3/uL (ref 0.1–1.0)
Monocytes Relative: 5 %
Neutro Abs: 6.6 10*3/uL (ref 1.7–7.7)
Neutrophils Relative %: 72 %

## 2020-06-17 LAB — CBC
HCT: 41.2 % (ref 36.0–46.0)
Hemoglobin: 14 g/dL (ref 12.0–15.0)
MCH: 29.6 pg (ref 26.0–34.0)
MCHC: 34 g/dL (ref 30.0–36.0)
MCV: 87.1 fL (ref 80.0–100.0)
Platelets: 288 10*3/uL (ref 150–400)
RBC: 4.73 MIL/uL (ref 3.87–5.11)
RDW: 13.1 % (ref 11.5–15.5)
WBC: 9.2 10*3/uL (ref 4.0–10.5)
nRBC: 0 % (ref 0.0–0.2)

## 2020-06-17 LAB — COMPREHENSIVE METABOLIC PANEL
ALT: 16 U/L (ref 0–44)
AST: 18 U/L (ref 15–41)
Albumin: 4.3 g/dL (ref 3.5–5.0)
Alkaline Phosphatase: 51 U/L (ref 38–126)
Anion gap: 9 (ref 5–15)
BUN: 14 mg/dL (ref 8–23)
CO2: 25 mmol/L (ref 22–32)
Calcium: 9.2 mg/dL (ref 8.9–10.3)
Chloride: 100 mmol/L (ref 98–111)
Creatinine, Ser: 0.69 mg/dL (ref 0.44–1.00)
GFR, Estimated: >60 mL/min (ref >60)
Glucose, Bld: 171 mg/dL — ABNORMAL HIGH (ref 70–99)
Potassium: 3.6 mmol/L (ref 3.5–5.1)
Sodium: 134 mmol/L — ABNORMAL LOW (ref 135–145)
Total Bilirubin: 0.8 mg/dL (ref 0.3–1.2)
Total Protein: 7.1 g/dL (ref 6.5–8.1)

## 2020-06-17 LAB — PROTIME-INR
INR: 1.1 (ref 0.8–1.2)
Prothrombin Time: 14 seconds (ref 11.4–15.2)

## 2020-06-17 LAB — APTT: aPTT: 31 seconds (ref 24–36)

## 2020-06-17 NOTE — Discharge Instructions (Addendum)
Please seek medical attention for any high fevers, chest pain, shortness of breath, change in behavior, persistent vomiting, bloody stool or any other new or concerning symptoms.  

## 2020-06-17 NOTE — Telephone Encounter (Signed)
Patient always has Aura prior to having a migraine she said the she had aphasia lasting  3 to 4 minutes no other symptoms, no tingling, numbness noted, daughter stated smile was normal lookng at her, patient stated she was just trying to let her daughter know she had not eaten french fries when this happen in the past Auras before migraines she has always had flashing lights and could sense the Aura coming on but that last night was different. I advised patient PCP not in office and with this could be some kind of aura or could be  A TIA we cannot tell by a call and that she needs further work up that its not normal to be on the phone and suddenly not be able to speak or think of words that are common. Patient also did not develop a migraine after as she normally would have. Patient is having daughter take her to ER. For evaluation.

## 2020-06-17 NOTE — ED Triage Notes (Signed)
Pt to ED via POV with daughter, pt's daughter reports episode of expressive aphasia yesterday that lasted approx 1-2 mins then resolved on it's own. Pt denies further symptoms at this time, pt alert and oriented, ambulatory and able to speak in full and complete sentences at this time.

## 2020-06-17 NOTE — ED Provider Notes (Signed)
[facility] Emergency Department Provider Note   ____________________________________________   I have reviewed the triage vital signs and the nursing notes.   HISTORY  Chief Complaint Aphasia   History limited by: Not Limited   HPI Laura [name] is a 87 y.o. female who presents to the emergency department today because of concern for a brief episode of aphagia that occurred yesterday. The patient states she was talking to her daughter on the telephone when suddenly she could not express what she wanted to say. She felt like she knew what she wanted to say but was unable to get it out. It lasted for roughly 3-4 minutes. Right after this the patient developed the symptoms she normally gets with her ocular migraines. She says that the ocular migraines have been occurring more frequently. She denies any associated weakness or numbness to the extremities. Has felt in her normal state of health today.    Records reviewed. Per medical record review patient has a history of HTN, HLD.   Past Medical History:  Diagnosis Date  . Accidental fall 10/16/2013  . Anxiety   . Carotid stenosis    Left   . Fracture of rib 09/26/2019  . High cholesterol   . Hypertension    Pt states that her blood pressure is very well controlled  . Migraine with aura   . Neuropathy   . Osteoporosis     Patient Active Problem List   Diagnosis Date Noted  . Gait disorder 06/04/2020  . Aortic atherosclerosis (HCC) 05/02/2020  . Altered bowel function 02/03/2020  . Degenerative joint disease (DJD) of hip 11/29/2018  . Bursitis of left hip 05/31/2018  . Encounter for preventive health examination 07/31/2016  . Lumbar radiculitis 12/03/2014  . DDD (degenerative disc disease), lumbar 12/03/2014  . Intracranial arachnoid cyst 10/25/2014  . Migraine aura without headache 04/24/2014  . Encounter for Medicare annual wellness exam 11/01/2013  . S/P hysterectomy with oophorectomy 01/03/2013   . HTN (hypertension) 10/19/2012  . B12 deficiency 04/16/2012  . Anxiety state 04/16/2012  . Generalized anxiety disorder 04/16/2012  . Neuropathy of both feet 09/18/2011  . Hyperlipidemia LDL goal <70 03/21/2011  . Insomnia due to anxiety and fear 03/21/2011  . Osteoporosis     Past Surgical History:  Procedure Laterality Date  . ABDOMINAL HYSTERECTOMY     secondary to peritonitis and complications   . APPENDECTOMY    . TONSILLECTOMY    . TUBAL LIGATION     at age 47, vaginal complicated by peritonitis    Prior to Admission medications   Medication Sig Start Date End Date Taking? Authorizing Provider  ALPRAZolam Prudy Feeler) 0.5 MG tablet Take 0.5 tablets (0.25 mg total) by mouth 2 (two) times daily as needed for anxiety or sleep. 05/02/20   Sherlene Shams, MD  amLODipine (NORVASC) 2.5 MG tablet Take 1 tablet (2.5 mg total) by mouth daily. 06/06/20   Sherlene Shams, MD  atorvastatin (LIPITOR) 20 MG tablet Take 1 tablet (20 mg total) by mouth daily. 06/02/20   Sherlene Shams, MD  calcium carbonate (OS-CAL) 600 MG TABS Take 600 mg by mouth 2 (two) times daily with a meal.    [provider]  Cholecalciferol (VITAMIN D3) 50 MCG (2000 UT) CAPS Take 1 capsule by mouth daily.    [provider]  Coenzyme Q10 (CO Q 10) 100 MG CAPS Take 1 capsule by mouth daily.    [provider]  Cyanocobalamin 2500 MCG SUBL  PLACE 1 TABLET UNDER THE TONGUE DAILY AS DIRECTED 06/07/16   Sherlene Shams, MD  denosumab (PROLIA) 60 MG/ML SOLN injection Inject 60 mg into the skin every 6 (six) months. Administer in upper arm, thigh, or abdomen    [provider]  escitalopram (LEXAPRO) 10 MG tablet Take 1 tablet (10 mg total) by mouth daily. 06/02/20   Sherlene Shams, MD  magnesium oxide (MAG-OX) 400 MG tablet Take 400 mg by mouth 2 (two) times daily.    [provider]  Multiple Vitamins-Minerals (PRESERVISION AREDS 2 PO) Take 1 capsule by mouth daily.    [provider]  nitroGLYCERIN (NITROSTAT) 0.4 MG SL tablet Place 1 tablet (0.4 mg total) under the tongue every 5 (five) minutes as needed for chest pain. 08/26/15   Sherlene Shams, MD  psyllium (METAMUCIL) 58.6 % packet Take 1 packet by mouth daily.    [provider]  Riboflavin 100 MG CAPS Take 1 capsule by mouth 2 (two) times daily.     [provider]  triamterene-hydrochlorothiazide (MAXZIDE-25) 37.5-25 MG tablet TAKE 1 TABLET BY MOUTH DAILY 02/04/20   Sherlene Shams, MD  zolpidem (AMBIEN) 10 MG tablet TAKE 1 TABLET(10 MG) BY MOUTH AT BEDTIME 06/02/20   Sherlene Shams, MD    Allergies Erythromycin  Family History  Adopted: Yes  Problem Relation Age of Onset  . Breast cancer Mother 68  . Cancer Daughter        Breast   . Breast cancer Daughter 8  . Breast cancer Sister 44       2 times    Social History Social History   Tobacco Use  . Smoking status: Former Smoker    Quit date: 1965    Years since quitting: 57.4  . Smokeless tobacco: Never Used  Substance Use Topics  . Alcohol use: Yes    Alcohol/week: 14.0 standard drinks    Types: 14 Glasses of wine per week    Comment: 2 glasses of wine per day.  . Drug use: No    Review of Systems Constitutional: No fever/chills Eyes: Positive for vision change ENT: No sore throat. Cardiovascular: Denies chest pain. Respiratory: Denies shortness of breath. Gastrointestinal: No abdominal pain.  No nausea, no vomiting.  No diarrhea.   Genitourinary: Negative for dysuria. Musculoskeletal: Negative for back pain. Skin: Negative for rash. Neurological: Positive for aphagia.  ____________________________________________   PHYSICAL EXAM:  VITAL SIGNS: ED Triage Vitals  Enc Vitals Group     BP 06/17/20 1230 111/65     Pulse Rate 06/17/20 1230 94     Resp 06/17/20 1230 16     Temp 06/17/20 1230 97.8 F (36.6 C)     Temp Source 06/17/20 1230 Oral     SpO2 06/17/20 1230 97 %     Weight 06/17/20 1224 143  lb (64.9 kg)     Height 06/17/20 1224 5\' 4"  (1.626 m)     Head Circumference --      Peak Flow --      Pain Score 06/17/20 1224 0   Constitutional: Alert and oriented.  Eyes: Conjunctivae are normal.  ENT      Head: Normocephalic and atraumatic.      Nose: No congestion/rhinnorhea.      Mouth/Throat: Mucous membranes are moist.      Neck: No stridor. Hematological/Lymphatic/Immunilogical: No cervical lymphadenopathy. Cardiovascular: Normal rate, regular rhythm.  No murmurs, rubs, or gallops.  Respiratory: Normal respiratory effort without tachypnea  nor retractions. Breath sounds are clear and equal bilaterally. No wheezes/rales/rhonchi. Gastrointestinal: Soft and non tender. No rebound. No guarding.  Genitourinary: Deferred Musculoskeletal: Normal range of motion in all extremities. No lower extremity edema. Neurologic:  Normal speech and language. EOMI. PERRL. No pronator drift. Strength 5/5 in lower extremities. Sensation grossly intact. No gross focal neurologic deficits are appreciated.  Skin:  Skin is warm, dry and intact. No rash noted. Psychiatric: Mood and affect are normal. Speech and behavior are normal. Patient exhibits appropriate insight and judgment.  ____________________________________________    LABS (pertinent positives/negatives)  CMP wnl except na 134, glu 171 CBC wbc 9.2, hgb 14.0, plt 288  ____________________________________________   EKG  I, Phineas Semen, attending physician, personally viewed and interpreted this EKG  EKG Time: 1228 Rate: 88 Rhythm: sinus rhythm with PVCs Axis: right axis deviation Intervals: qtc 445 QRS: narrow, low voltage qrs ST changes: no st elevation Impression: abnormal ekg   ____________________________________________    RADIOLOGY  CT head No acute abnromality  ____________________________________________   PROCEDURES  Procedures  ____________________________________________   INITIAL IMPRESSION /  ASSESSMENT AND PLAN / ED COURSE  Pertinent labs & imaging results that were available during my care of the patient were reviewed by me and considered in my medical decision making (see chart for details).   Patient presented to the emergency department today because of concern for a brief episode of aphagia that occurred yesterday. CT head without any acute findings. Patient does have ocular migraines and state she got those symptoms right after the aphagia. At this time do think more likely aphagia is related to migraine. Discussed with patient possibility of TIA but again think less likely given timing. Patient felt comfortable with no further emergent work up which I think is reasonable. Will plan on discharging to follow up with PCP. Encouraged return for any new symptoms.   ____________________________________________   FINAL CLINICAL IMPRESSION(S) / ED DIAGNOSES  Final diagnoses:  Aphagia  Ocular migraine     Note: This dictation was prepared with Dragon dictation. Any transcriptional errors that result from this process are unintentional     Phineas Semen, MD 06/17/20 [phone]

## 2020-06-21 DIAGNOSIS — H8111 Benign paroxysmal vertigo, right ear: Secondary | ICD-10-CM | POA: Diagnosis not present

## 2020-06-24 ENCOUNTER — Other Ambulatory Visit: Payer: Self-pay | Admitting: Internal Medicine

## 2020-08-17 ENCOUNTER — Ambulatory Visit: Payer: Medicare PPO | Admitting: Internal Medicine

## 2020-08-23 ENCOUNTER — Ambulatory Visit: Payer: Medicare PPO | Admitting: Internal Medicine

## 2020-08-25 DIAGNOSIS — L821 Other seborrheic keratosis: Secondary | ICD-10-CM | POA: Diagnosis not present

## 2020-08-25 DIAGNOSIS — D485 Neoplasm of uncertain behavior of skin: Secondary | ICD-10-CM | POA: Diagnosis not present

## 2020-08-27 ENCOUNTER — Other Ambulatory Visit: Payer: Self-pay | Admitting: Internal Medicine

## 2020-09-14 ENCOUNTER — Encounter: Payer: Self-pay | Admitting: Internal Medicine

## 2020-09-14 ENCOUNTER — Telehealth: Payer: Self-pay | Admitting: Internal Medicine

## 2020-09-14 ENCOUNTER — Telehealth: Payer: Medicare PPO | Admitting: Internal Medicine

## 2020-09-14 DIAGNOSIS — U071 COVID-19: Secondary | ICD-10-CM

## 2020-09-14 HISTORY — DX: COVID-19: U07.1

## 2020-09-14 MED ORDER — BENZONATATE 200 MG PO CAPS: 200.0000 mg | ORAL_CAPSULE | Freq: Three times a day (TID) | ORAL | 0 refills | Status: DC | PRN

## 2020-09-14 MED ORDER — MOLNUPIRAVIR EUA 200MG CAPSULE: 4.0000 | ORAL_CAPSULE | Freq: Two times a day (BID) | ORAL | 0 refills | Status: AC

## 2020-09-14 NOTE — Telephone Encounter (Signed)
Beth from [facility] called in stating that the patient tested positive for COVID and is high risk.She has high cholesterol and they would like to know if she can be prescribed an antiviral medicine due to being high risk.        

## 2020-09-14 NOTE — Telephone Encounter (Signed)
Laura [name] from [facility] called in stating that the patient tested positive for COVID and is high risk.She has high cholesterol and they would like to know if she can be prescribed an antiviral medicine due to being high risk.

## 2020-09-14 NOTE — Telephone Encounter (Signed)
Patient informed, Due to the high volume of calls and your symptoms we have to forward your call to our Triage Nurse to expedient your call. Please hold for the transfer.  Patient transferred to Access Nurse. Due to PT having tested positive for covid and having a 106 fever. Advise no apts available and transfer to Access Nurse.

## 2020-09-14 NOTE — Progress Notes (Signed)
Virtual Visit via Caregility Note  This visit type was conducted due to national recommendations for restrictions regarding the COVID-19 pandemic (e.g. social distancing).  This format is felt to be most appropriate for this patient at this time.  All issues noted in this document were discussed and addressed.  No physical exam was performed (except for noted visual exam findings with Video Visits).   I connected withNAME@ on 09/14/20 at  1:15 PM EDT by a video enabled telemedicine application and verified that I am speaking with the correct person using two identifiers. Location patient: home Location provider: work or home office Persons participating in the virtual visit: patient, provider  I discussed the limitations, risks, security and privacy concerns of performing an evaluation and management service by telephone and the availability of in person appointments. I also discussed with the patient that there may be a patient responsible charge related to this service. The patient expressed understanding and agreed to proceed.   Reason for visit:  Acute COVID infection   HPI:  Laura [name] is an 87 yr old female with hypertension and aortic atherosclerosis  who tested positive for COVID this morning after developing body aches,  nonproductive cough,   LG fever (100.6) and sinus congestion.  Yesterday she had developed sneezing without any systemic symptoms . She denies pleurisy,  dyspnea, nausea and anosmia.   ROS: See pertinent positives and negatives per HPI.  Past Medical History:  Diagnosis Date   Accidental fall 10/16/2013   Anxiety    Carotid stenosis    Left    Fracture of rib 09/26/2019   High cholesterol    Hypertension    Pt states that her blood pressure is very well controlled   Migraine with aura    Neuropathy    Osteoporosis     Past Surgical History:  Procedure Laterality Date   ABDOMINAL HYSTERECTOMY     secondary to peritonitis and complications    APPENDECTOMY      TONSILLECTOMY     TUBAL LIGATION     at age 38, vaginal complicated by peritonitis    Family History  Adopted: Yes  Problem Relation Age of Onset   Breast cancer Mother 94   Cancer Daughter        Breast    Breast cancer Daughter 28   Breast cancer Sister 55       2 times    SOCIAL HX:  reports that she quit smoking about 57 years ago. Her smoking use included cigarettes. She has never used smokeless tobacco. She reports current alcohol use of about 14.0 standard drinks per week. She reports that she does not use drugs.    Current Outpatient Medications:    ALPRAZolam (XANAX) 0.5 MG tablet, Take 0.5 tablets (0.25 mg total) by mouth 2 (two) times daily as needed for anxiety or sleep., Disp: 30 tablet, Rfl: 0   amLODipine (NORVASC) 2.5 MG tablet, TAKE 1 TABLET(2.5 MG) BY MOUTH DAILY, Disp: 90 tablet, Rfl: 1   atorvastatin (LIPITOR) 20 MG tablet, Take 1 tablet (20 mg total) by mouth daily., Disp: 90 tablet, Rfl: 3   benzonatate (TESSALON) 200 MG capsule, Take 1 capsule (200 mg total) by mouth 3 (three) times daily as needed for cough., Disp: 30 capsule, Rfl: 0   calcium carbonate (OS-CAL) 600 MG TABS, Take 600 mg by mouth 2 (two) times daily with a meal., Disp: , Rfl:    Cholecalciferol (VITAMIN D3) 50 MCG (2000 UT) CAPS, Take 1 capsule  by mouth daily., Disp: , Rfl:    Coenzyme Q10 (CO Q 10) 100 MG CAPS, Take 1 capsule by mouth daily., Disp: , Rfl:    Cyanocobalamin 2500 MCG SUBL, PLACE 1 TABLET UNDER THE TONGUE DAILY AS DIRECTED, Disp: 90 tablet, Rfl: 1   denosumab (PROLIA) 60 MG/ML SOLN injection, Inject 60 mg into the skin every 6 (six) months. Administer in upper arm, thigh, or abdomen, Disp: , Rfl:    escitalopram (LEXAPRO) 10 MG tablet, TAKE 1 TABLET(10 MG) BY MOUTH DAILY, Disp: 90 tablet, Rfl: 0   magnesium oxide (MAG-OX) 400 MG tablet, Take 400 mg by mouth 2 (two) times daily., Disp: , Rfl:    molnupiravir EUA 200 mg CAPS, Take 4 capsules (800 mg total) by mouth 2 (two) times  daily for 5 days., Disp: 40 capsule, Rfl: 0   Multiple Vitamins-Minerals (PRESERVISION AREDS 2 PO), Take 1 capsule by mouth daily., Disp: , Rfl:    nitroGLYCERIN (NITROSTAT) 0.4 MG SL tablet, Place 1 tablet (0.4 mg total) under the tongue every 5 (five) minutes as needed for chest pain., Disp: 50 tablet, Rfl: 3   psyllium (METAMUCIL) 58.6 % packet, Take 1 packet by mouth daily., Disp: , Rfl:    Riboflavin 100 MG CAPS, Take 1 capsule by mouth 2 (two) times daily. , Disp: , Rfl:    triamterene-hydrochlorothiazide (MAXZIDE-25) 37.5-25 MG tablet, TAKE 1 TABLET BY MOUTH DAILY, Disp: 90 tablet, Rfl: 1   zolpidem (AMBIEN) 10 MG tablet, TAKE 1 TABLET(10 MG) BY MOUTH AT BEDTIME, Disp: 30 tablet, Rfl: 5  Current Facility-Administered Medications:    denosumab (PROLIA) injection 60 mg, 60 mg, Subcutaneous, Once, Darrick Huntsman, Mar Daring, MD  EXAM:  VITALS per patient if applicable:  GENERAL: alert, oriented, appears well and in no acute distress  HEENT: atraumatic, conjunttiva clear, no obvious abnormalities on inspection of external nose and ears  NECK: normal movements of the head and neck  LUNGS: on inspection no signs of respiratory distress, breathing rate appears normal, no obvious gross SOB, gasping or wheezing  CV: no obvious cyanosis  Laura: moves all visible extremities without noticeable abnormality  PSYCH/NEURO: pleasant and cooperative, no obvious depression or anxiety, speech and thought processing grossly intact  ASSESSMENT AND PLAN:  Discussed the following assessment and plan:  COVID-19 virus infection  COVID-19 virus infection SYMPTOMS are mild but given her age will prescribe molnupiravir and tessalon for cough.  Other supportive care outlined     I discussed the assessment and treatment plan with the patient. The patient was provided an opportunity to ask questions and all were answered. The patient agreed with the plan and demonstrated an understanding of the instructions.    The patient was advised to call back or seek an in-person evaluation if the symptoms worsen or if the condition fails to improve as anticipated.   I spent 20 minutes dedicated to the care of this patient on the date of this encounter to include pre-visit review of her medical history,  Face-to-face time with the patient , and post visit ordering of testing and therapeutics.    Laura Shams, MD

## 2020-09-14 NOTE — Assessment & Plan Note (Addendum)
SYMPTOMS are mild but given her age will prescribe molnupiravir and tessalon for cough.  Other supportive care outlined

## 2020-09-14 NOTE — Telephone Encounter (Signed)
Appointment has been scheduled with pcp @1315 

## 2020-09-15 ENCOUNTER — Ambulatory Visit: Payer: Medicare PPO

## 2020-09-22 ENCOUNTER — Other Ambulatory Visit: Payer: Self-pay | Admitting: Internal Medicine

## 2020-09-22 MED ORDER — CHERATUSSIN AC 100-10 MG/5ML PO SOLN: 5.0000 mL | Freq: Three times a day (TID) | ORAL | 0 refills | Status: DC | PRN

## 2020-10-10 DIAGNOSIS — D225 Melanocytic nevi of trunk: Secondary | ICD-10-CM | POA: Diagnosis not present

## 2020-10-10 DIAGNOSIS — D2261 Melanocytic nevi of right upper limb, including shoulder: Secondary | ICD-10-CM | POA: Diagnosis not present

## 2020-10-10 DIAGNOSIS — D2262 Melanocytic nevi of left upper limb, including shoulder: Secondary | ICD-10-CM | POA: Diagnosis not present

## 2020-10-10 DIAGNOSIS — D2271 Melanocytic nevi of right lower limb, including hip: Secondary | ICD-10-CM | POA: Diagnosis not present

## 2020-10-10 DIAGNOSIS — L538 Other specified erythematous conditions: Secondary | ICD-10-CM | POA: Diagnosis not present

## 2020-10-10 DIAGNOSIS — L570 Actinic keratosis: Secondary | ICD-10-CM | POA: Diagnosis not present

## 2020-10-10 DIAGNOSIS — D485 Neoplasm of uncertain behavior of skin: Secondary | ICD-10-CM | POA: Diagnosis not present

## 2020-10-10 DIAGNOSIS — L821 Other seborrheic keratosis: Secondary | ICD-10-CM | POA: Diagnosis not present

## 2020-10-10 DIAGNOSIS — D2272 Melanocytic nevi of left lower limb, including hip: Secondary | ICD-10-CM | POA: Diagnosis not present

## 2020-10-10 DIAGNOSIS — L820 Inflamed seborrheic keratosis: Secondary | ICD-10-CM | POA: Diagnosis not present

## 2020-10-10 DIAGNOSIS — B360 Pityriasis versicolor: Secondary | ICD-10-CM | POA: Diagnosis not present

## 2020-10-14 DIAGNOSIS — H9123 Sudden idiopathic hearing loss, bilateral: Secondary | ICD-10-CM | POA: Diagnosis not present

## 2020-10-14 DIAGNOSIS — H903 Sensorineural hearing loss, bilateral: Secondary | ICD-10-CM | POA: Diagnosis not present

## 2020-10-26 DIAGNOSIS — X32XXXA Exposure to sunlight, initial encounter: Secondary | ICD-10-CM | POA: Diagnosis not present

## 2020-10-26 DIAGNOSIS — L570 Actinic keratosis: Secondary | ICD-10-CM | POA: Diagnosis not present

## 2020-10-31 DIAGNOSIS — H903 Sensorineural hearing loss, bilateral: Secondary | ICD-10-CM | POA: Diagnosis not present

## 2020-11-19 ENCOUNTER — Ambulatory Visit
Admission: EM | Admit: 2020-11-19 | Discharge: 2020-11-19 | Disposition: A | Discharge status: Home / Self Care | Payer: Medicare PPO | Attending: Emergency Medicine | Admitting: Emergency Medicine

## 2020-11-19 ENCOUNTER — Encounter: Payer: Self-pay | Admitting: Emergency Medicine

## 2020-11-19 DIAGNOSIS — L03115 Cellulitis of right lower limb: Secondary | ICD-10-CM | POA: Diagnosis not present

## 2020-11-19 MED ORDER — CEPHALEXIN 500 MG PO CAPS: 500.0000 mg | ORAL_CAPSULE | Freq: Four times a day (QID) | ORAL | 0 refills | Status: DC

## 2020-11-19 NOTE — ED Provider Notes (Signed)
Laura [name]    CSN: [id number] Arrival date & time: 11/19/20  1516      History   Chief Complaint Chief Complaint  Patient presents with   Wound Check    HPI Laura [name] is a 87 y.o. female.  Patient presents with wound and redness on her right lower leg x2 weeks.  She cut her leg on her daughter's bed frame.  The area around the wound has become red.  No drainage.  She denies fever, chills, or other symptoms.  Treatment at home with antibiotic ointment.  Her medical history includes hypertension, carotid stenosis, neuropathy, osteoporosis.  The history is provided by the patient and medical records.   Past Medical History:  Diagnosis Date   Accidental fall 10/16/2013   Anxiety    Carotid stenosis    Left    Fracture of rib 09/26/2019   High cholesterol    Hypertension    Pt states that her blood pressure is very well controlled   Migraine with aura    Neuropathy    Osteoporosis     Patient Active Problem List   Diagnosis Date Noted   COVID-19 virus infection 09/14/2020   Gait disorder 06/04/2020   Aortic atherosclerosis (HCC) 05/02/2020   Altered bowel function 02/03/2020   Degenerative joint disease (DJD) of hip 11/29/2018   Bursitis of left hip 05/31/2018   Encounter for preventive health examination 07/31/2016   Lumbar radiculitis 12/03/2014   DDD (degenerative disc disease), lumbar 12/03/2014   Intracranial arachnoid cyst 10/25/2014   Migraine aura without headache 04/24/2014   Encounter for Medicare annual wellness exam 11/01/2013   S/P hysterectomy with oophorectomy 01/03/2013   HTN (hypertension) 10/19/2012   B12 deficiency 04/16/2012   Anxiety state 04/16/2012   Generalized anxiety disorder 04/16/2012   Neuropathy of both feet 09/18/2011   Hyperlipidemia LDL goal <70 03/21/2011   Insomnia due to anxiety and fear 03/21/2011   Osteoporosis     Past Surgical History:  Procedure Laterality Date   ABDOMINAL HYSTERECTOMY     secondary to  peritonitis and complications    APPENDECTOMY     TONSILLECTOMY     TUBAL LIGATION     at age 36, vaginal complicated by peritonitis    OB History   No obstetric history on file.      Home Medications    Prior to Admission medications   Medication Sig Start Date End Date Taking? Authorizing Provider  cephALEXin (KEFLEX) 500 MG capsule Take 1 capsule (500 mg total) by mouth 4 (four) times daily. 11/19/20  Yes Mickie Bail, NP  ALPRAZolam Prudy Feeler) 0.5 MG tablet Take 0.5 tablets (0.25 mg total) by mouth 2 (two) times daily as needed for anxiety or sleep. 05/02/20   Sherlene Shams, MD  amLODipine (NORVASC) 2.5 MG tablet TAKE 1 TABLET(2.5 MG) BY MOUTH DAILY 06/24/20   Sherlene Shams, MD  atorvastatin (LIPITOR) 20 MG tablet Take 1 tablet (20 mg total) by mouth daily. 06/02/20   Sherlene Shams, MD  benzonatate (TESSALON) 200 MG capsule Take 1 capsule (200 mg total) by mouth 3 (three) times daily as needed for cough. 09/14/20   Sherlene Shams, MD  calcium carbonate (OS-CAL) 600 MG TABS Take 600 mg by mouth 2 (two) times daily with a meal.    [provider]  Cholecalciferol (VITAMIN D3) 50 MCG (2000 UT) CAPS Take 1 capsule by mouth daily.    [provider]  Coenzyme Q10 (CO Q  10) 100 MG CAPS Take 1 capsule by mouth daily.    [provider]  Cyanocobalamin 2500 MCG SUBL PLACE 1 TABLET UNDER THE TONGUE DAILY AS DIRECTED 06/07/16   Sherlene Shams, MD  denosumab (PROLIA) 60 MG/ML SOLN injection Inject 60 mg into the skin every 6 (six) months. Administer in upper arm, thigh, or abdomen    [provider]  escitalopram (LEXAPRO) 10 MG tablet TAKE 1 TABLET(10 MG) BY MOUTH DAILY 08/29/20   Sherlene Shams, MD  guaiFENesin-codeine (CHERATUSSIN AC) 100-10 MG/5ML syrup Take 5 mLs by mouth 3 (three) times daily as needed for cough. 09/22/20   Sherlene Shams, MD  magnesium oxide (MAG-OX) 400 MG tablet Take 400 mg by mouth 2 (two) times daily.    [provider]   Multiple Vitamins-Minerals (PRESERVISION AREDS 2 PO) Take 1 capsule by mouth daily.    [provider]  nitroGLYCERIN (NITROSTAT) 0.4 MG SL tablet Place 1 tablet (0.4 mg total) under the tongue every 5 (five) minutes as needed for chest pain. 08/26/15   Sherlene Shams, MD  psyllium (METAMUCIL) 58.6 % packet Take 1 packet by mouth daily.    [provider]  Riboflavin 100 MG CAPS Take 1 capsule by mouth 2 (two) times daily.     [provider]  triamterene-hydrochlorothiazide (MAXZIDE-25) 37.5-25 MG tablet TAKE 1 TABLET BY MOUTH DAILY 02/04/20   Sherlene Shams, MD  zolpidem (AMBIEN) 10 MG tablet TAKE 1 TABLET(10 MG) BY MOUTH AT BEDTIME 06/02/20   Sherlene Shams, MD    Family History Family History  Adopted: Yes  Problem Relation Age of Onset   Breast cancer Mother 78   Cancer Daughter        Breast    Breast cancer Daughter 68   Breast cancer Sister 49       2 times    Social History Social History   Tobacco Use   Smoking status: Former    Types: Cigarettes    Quit date: 1965    Years since quitting: 57.8   Smokeless tobacco: Never  Substance Use Topics   Alcohol use: Yes    Alcohol/week: 14.0 standard drinks    Types: 14 Glasses of wine per week    Comment: 2 glasses of wine per day.   Drug use: No     Allergies   Erythromycin   Review of Systems Review of Systems  Constitutional:  Negative for chills and fever.  Respiratory:  Negative for cough and shortness of breath.   Cardiovascular:  Negative for chest pain and palpitations.  Musculoskeletal:  Negative for gait problem and joint swelling.  Skin:  Positive for color change and wound.  Neurological:  Negative for weakness and numbness.  All other systems reviewed and are negative.   Physical Exam Triage Vital Signs ED Triage Vitals  Enc Vitals Group     BP 11/19/20 1534 124/78     Pulse Rate 11/19/20 1534 89     Resp 11/19/20 1534 18     Temp 11/19/20 1534 98.6 F (37 C)      Temp Source 11/19/20 1534 Oral     SpO2 11/19/20 1534 96 %     Weight --      Height --      Head Circumference --      Peak Flow --      Pain Score 11/19/20 1552 1     Pain Loc --  Pain Edu? --      Excl. in GC? --    No data found.  Updated Vital Signs BP 124/78 (BP Location: Left Arm)   Pulse 89   Temp 98.6 F (37 C) (Oral)   Resp 18   SpO2 96%   Visual Acuity Right Eye Distance:   Left Eye Distance:   Bilateral Distance:    Right Eye Near:   Left Eye Near:    Bilateral Near:     Physical Exam Vitals and nursing note reviewed.  Constitutional:      General: She is not in acute distress.    Appearance: She is well-developed. She is not ill-appearing.  HENT:     Head: Normocephalic and atraumatic.     Mouth/Throat:     Mouth: Mucous membranes are moist.  Eyes:     Conjunctiva/sclera: Conjunctivae normal.  Cardiovascular:     Rate and Rhythm: Normal rate and regular rhythm.     Heart sounds: Normal heart sounds.  Pulmonary:     Effort: Pulmonary effort is normal. No respiratory distress.     Breath sounds: Normal breath sounds.  Abdominal:     Palpations: Abdomen is soft.     Tenderness: There is no abdominal tenderness.  Musculoskeletal:        General: No swelling. Normal range of motion.     Cervical back: Neck supple.  Skin:    General: Skin is warm and dry.     Findings: Erythema and lesion present.     Comments: 1 cm scabbed laceration on right anterior lower leg with 6 cm surrounding slight erythema.  No drainage.  Neurological:     General: No focal deficit present.     Mental Status: She is alert and oriented to person, place, and time.     Sensory: No sensory deficit.     Motor: No weakness.     Gait: Gait normal.  Psychiatric:        Mood and Affect: Mood normal.        Behavior: Behavior normal.     UC Treatments / Results  Labs (all labs ordered are listed, but only abnormal results are displayed) Labs Reviewed - No data to  display  EKG   Radiology No results found.  Procedures Procedures (including critical care time)  Medications Ordered in UC Medications - No data to display  Initial Impression / Assessment and Plan / UC Course  I have reviewed the triage vital signs and the nursing notes.  Pertinent labs & imaging results that were available during my care of the patient were reviewed by me and considered in my medical decision making (see chart for details).  Cellulitis of right anterior lower leg.  Treating with cephalexin.  Wound care instructions and signs of worsening infection discussed.  Instructed patient to go to the ED if she has signs of worsening infection.  Education provided on cellulitis.  Patient agrees to plan of care.   Final Clinical Impressions(s) / UC Diagnoses   Final diagnoses:  Cellulitis of right anterior lower leg     Discharge Instructions      Take the cephalexin as directed.  Continue the wound care as directed.    Go to the emergency department right away if you develop fever, red streaks, increased redness, or other signs of worsening infection.         ED Prescriptions     Medication Sig Dispense Auth. Provider   cephALEXin (KEFLEX) 500  MG capsule Take 1 capsule (500 mg total) by mouth 4 (four) times daily. 28 capsule Mickie Bail, NP      PDMP not reviewed this encounter.   Mickie Bail, NP 11/19/20 [phone]

## 2020-11-19 NOTE — ED Triage Notes (Signed)
Pt here with small right shin wound acquired 2 weeks ago. Has taken good care of wound keeping it clean, dry, and covered with abx ointment and bandage. Erythema has begun to extend outward from wound and is spreading as of 2 days ago.

## 2020-11-19 NOTE — Discharge Instructions (Addendum)
Take the cephalexin as directed.  Continue the wound care as directed.    Go to the emergency department right away if you develop fever, red streaks, increased redness, or other signs of worsening infection.

## 2020-11-23 ENCOUNTER — Other Ambulatory Visit: Payer: Self-pay | Admitting: Internal Medicine

## 2020-11-25 ENCOUNTER — Telehealth: Payer: Self-pay | Admitting: Internal Medicine

## 2020-11-25 NOTE — Telephone Encounter (Signed)
Filed PA on Cover [employer] meds.   Laura [name] Key: [id number] - PA Case ID: [id number]

## 2020-11-28 NOTE — Telephone Encounter (Signed)
PA [id number] Approved from 01/2021 until 01/28/2022 [employer]. Patient scheduled for 12/05/2020

## 2020-11-28 NOTE — Telephone Encounter (Signed)
Patient scheduled to be seen 11/16 virtually. Should Patient be seen sooner for medication management?   Should Patient be seen in person to check the status of her cellulitis with onging symptoms?   Please advise

## 2020-12-05 ENCOUNTER — Ambulatory Visit: Payer: Medicare PPO

## 2020-12-07 ENCOUNTER — Ambulatory Visit (INDEPENDENT_AMBULATORY_CARE_PROVIDER_SITE_OTHER): Payer: Medicare PPO

## 2020-12-07 ENCOUNTER — Other Ambulatory Visit: Payer: Self-pay

## 2020-12-07 DIAGNOSIS — M810 Age-related osteoporosis without current pathological fracture: Secondary | ICD-10-CM

## 2020-12-07 MED ORDER — DENOSUMAB 60 MG/ML ~~LOC~~ SOSY
60.0000 mg | PREFILLED_SYRINGE | Freq: Once | SUBCUTANEOUS | Status: AC
  Administered 2020-12-07: 11:00:00 60 mg via SUBCUTANEOUS

## 2020-12-07 NOTE — Progress Notes (Signed)
Patient presented for a prolia injection to the left arm. Pt voiced no concerns or discomfort at time of injection.

## 2020-12-08 ENCOUNTER — Other Ambulatory Visit: Payer: Self-pay | Admitting: Internal Medicine

## 2020-12-13 ENCOUNTER — Telehealth: Payer: Self-pay

## 2020-12-13 NOTE — Telephone Encounter (Signed)
I've been taking my blood pressure weekly for the last three weeks, primarily because I have been feeling lightheaded and dizzy. First reading was 134/68, last week 124/66, today 122/58. Should I be concerned? Is there something I should do? Continue hypertension meds as usual?

## 2020-12-14 ENCOUNTER — Encounter: Payer: Self-pay | Admitting: Adult Health

## 2020-12-14 ENCOUNTER — Ambulatory Visit: Payer: Medicare PPO | Admitting: Adult Health

## 2020-12-14 ENCOUNTER — Telehealth: Payer: Medicare PPO | Admitting: Internal Medicine

## 2020-12-14 ENCOUNTER — Other Ambulatory Visit: Payer: Self-pay

## 2020-12-14 VITALS — BP 115/72 | HR 86 | Temp 96.5°F | Ht 64.02 in | Wt 141.2 lb

## 2020-12-14 DIAGNOSIS — R031 Nonspecific low blood-pressure reading: Secondary | ICD-10-CM

## 2020-12-14 DIAGNOSIS — R42 Dizziness and giddiness: Secondary | ICD-10-CM | POA: Diagnosis not present

## 2020-12-14 LAB — TSH: TSH: 2.79 u[IU]/mL (ref 0.35–5.50)

## 2020-12-14 LAB — CBC WITH DIFFERENTIAL/PLATELET
Basophils Absolute: 0 10*3/uL (ref 0.0–0.1)
Basophils Relative: 0.3 % (ref 0.0–3.0)
Eosinophils Absolute: 0.3 10*3/uL (ref 0.0–0.7)
Eosinophils Relative: 4.4 % (ref 0.0–5.0)
HCT: 40.9 % (ref 36.0–46.0)
Hemoglobin: 13.4 g/dL (ref 12.0–15.0)
Lymphocytes Relative: 24.2 % (ref 12.0–46.0)
Lymphs Abs: 1.6 10*3/uL (ref 0.7–4.0)
MCHC: 32.8 g/dL (ref 30.0–36.0)
MCV: 89.5 fl (ref 78.0–100.0)
Monocytes Absolute: 0.4 10*3/uL (ref 0.1–1.0)
Monocytes Relative: 6.2 % (ref 3.0–12.0)
Neutro Abs: 4.3 10*3/uL (ref 1.4–7.7)
Neutrophils Relative %: 64.9 % (ref 43.0–77.0)
Platelets: 270 10*3/uL (ref 150.0–400.0)
RBC: 4.57 Mil/uL (ref 3.87–5.11)
RDW: 13.8 % (ref 11.5–15.5)
WBC: 6.7 10*3/uL (ref 4.0–10.5)

## 2020-12-14 LAB — COMPREHENSIVE METABOLIC PANEL
ALT: 20 U/L (ref 0–35)
AST: 18 U/L (ref 0–37)
Albumin: 4.4 g/dL (ref 3.5–5.2)
Alkaline Phosphatase: 58 U/L (ref 39–117)
BUN: 10 mg/dL (ref 6–23)
CO2: 28 mEq/L (ref 19–32)
Calcium: 9.1 mg/dL (ref 8.4–10.5)
Chloride: 99 mEq/L (ref 96–112)
Creatinine, Ser: 0.65 mg/dL (ref 0.40–1.20)
GFR: 79.91 mL/min (ref >60.00)
Glucose, Bld: 92 mg/dL (ref 70–99)
Potassium: 4.1 mEq/L (ref 3.5–5.1)
Sodium: 136 mEq/L (ref 135–145)
Total Bilirubin: 0.5 mg/dL (ref 0.2–1.2)
Total Protein: 6.6 g/dL (ref 6.0–8.3)

## 2020-12-14 MED ORDER — HYDROCHLOROTHIAZIDE 25 MG PO TABS: 12.5000 mg | ORAL_TABLET | Freq: Every day | ORAL | 0 refills | Status: DC

## 2020-12-14 NOTE — Progress Notes (Signed)
Acute Office Visit  Subjective:    Patient ID: TITIA ODAM, female    DOB: 23-Aug-1932, 87 y.o.   MRN: [id number]  Chief Complaint  Patient presents with   Dizziness    Dizziness This is a new problem. The current episode started 1 to 4 weeks ago (3 weeks). Pertinent negatives include no abdominal pain, anorexia, arthralgias, change in bowel habit, chest pain, chills, congestion, coughing, diaphoresis, fatigue, fever, headaches, joint swelling, myalgias, nausea, neck pain, numbness, rash, sore throat, swollen glands, urinary symptoms, vertigo, visual change, vomiting or weakness. Exacerbated by: getting up and down. She has tried nothing for the symptoms. The treatment provided mild relief.  Patient is in today for lower blood pressure readings 120's - 125 systolic  / 55- 60's diastolic's.  She reports she is drinking 64 ounces of water daily.  She denies any sensation of the room spinning. She denies any falls, injury or near syncope or syncope/  Amlodipine was added on 04/2020. She had lexapro added on 06/02/2020 by Dr. Darrick Huntsman for anxiety- she feels she is doing well. She takes xanax only occasionally. She has not taken any recently. She reports dizziness is worse with getting up and down. Denies any back pain or abdominal pain.denies any injury or trauma.  Patient  denies any fever, body aches,chills, rash, chest pain, shortness of breath, nausea, vomiting, or diarrhea.  Denies any recent illness.     Past Medical History:  Diagnosis Date   Accidental fall 10/16/2013   Anxiety    Carotid stenosis    Left    Fracture of rib 09/26/2019   High cholesterol    Hypertension    Pt states that her blood pressure is very well controlled   Migraine with aura    Neuropathy    Osteoporosis     Past Surgical History:  Procedure Laterality Date   ABDOMINAL HYSTERECTOMY     secondary to peritonitis and complications    APPENDECTOMY     TONSILLECTOMY     TUBAL LIGATION     at age 32,  vaginal complicated by peritonitis    Family History  Adopted: Yes  Problem Relation Age of Onset   Breast cancer Mother 92   Cancer Daughter        Breast    Breast cancer Daughter 41   Breast cancer Sister 61       2 times    Social History   Socioeconomic History   Marital status: Widowed    Spouse name: Not on file   Number of children: 2   Years of education: college   Highest education level: Master's degree (e.g., MA, MS, MEng, MEd, MSW, MBA)  Occupational History   Occupation: Retired  Tobacco Use   Smoking status: Former    Types: Cigarettes    Quit date: 1965    Years since quitting: 57.9   Smokeless tobacco: Never  Substance and Sexual Activity   Alcohol use: Yes    Alcohol/week: 14.0 standard drinks    Types: 14 Glasses of wine per week    Comment: 2 glasses of wine per day.   Drug use: No   Sexual activity: Never  Other Topics Concern   Not on file  Social History Narrative   Lives at home alone.   Left-handed.   2-4 cups caffeine per day.   Social Determinants of Health   Financial Resource Strain: Not on file  Food Insecurity: Not on file  Transportation Needs: Not  on file  Physical Activity: Not on file  Stress: Not on file  Social Connections: Not on file  Intimate Partner Violence: Not on file    Outpatient Medications Prior to Visit  Medication Sig Dispense Refill   ALPRAZolam (XANAX) 0.5 MG tablet Take 0.5 tablets (0.25 mg total) by mouth 2 (two) times daily as needed for anxiety or sleep. 30 tablet 0   amLODipine (NORVASC) 2.5 MG tablet TAKE 1 TABLET(2.5 MG) BY MOUTH DAILY 90 tablet 1   benzonatate (TESSALON) 200 MG capsule Take 1 capsule (200 mg total) by mouth 3 (three) times daily as needed for cough. 30 capsule 0   calcium carbonate (OS-CAL) 600 MG TABS Take 600 mg by mouth 2 (two) times daily with a meal.     Cholecalciferol (VITAMIN D3) 50 MCG (2000 UT) CAPS Take 1 capsule by mouth daily.     Coenzyme Q10 (CO Q 10) 100 MG CAPS  Take 1 capsule by mouth daily.     Cyanocobalamin 2500 MCG SUBL PLACE 1 TABLET UNDER THE TONGUE DAILY AS DIRECTED 90 tablet 1   denosumab (PROLIA) 60 MG/ML SOLN injection Inject 60 mg into the skin every 6 (six) months. Administer in upper arm, thigh, or abdomen     escitalopram (LEXAPRO) 10 MG tablet TAKE 1 TABLET(10 MG) BY MOUTH DAILY 90 tablet 0   guaiFENesin-codeine (CHERATUSSIN AC) 100-10 MG/5ML syrup Take 5 mLs by mouth 3 (three) times daily as needed for cough. 120 mL 0   magnesium oxide (MAG-OX) 400 MG tablet Take 400 mg by mouth 2 (two) times daily.     Multiple Vitamins-Minerals (PRESERVISION AREDS 2 PO) Take 1 capsule by mouth daily.     nitroGLYCERIN (NITROSTAT) 0.4 MG SL tablet Place 1 tablet (0.4 mg total) under the tongue every 5 (five) minutes as needed for chest pain. 50 tablet 3   psyllium (METAMUCIL) 58.6 % packet Take 1 packet by mouth daily.     Riboflavin 100 MG CAPS Take 1 capsule by mouth 2 (two) times daily.      zolpidem (AMBIEN) 10 MG tablet TAKE 1 TABLET(10 MG) BY MOUTH AT BEDTIME 30 tablet 5   cephALEXin (KEFLEX) 500 MG capsule Take 1 capsule (500 mg total) by mouth 4 (four) times daily. 28 capsule 0   triamterene-hydrochlorothiazide (MAXZIDE-25) 37.5-25 MG tablet TAKE 1 TABLET BY MOUTH DAILY 90 tablet 1   Facility-Administered Medications Prior to Visit  Medication Dose Route Frequency Provider Last Rate Last Admin   denosumab (PROLIA) injection 60 mg  60 mg Subcutaneous Once Sherlene Shams, MD        Allergies  Allergen Reactions   Erythromycin     cramping    Review of Systems  Constitutional:  Negative for chills, diaphoresis, fatigue and fever.  HENT:  Negative for congestion and sore throat.   Respiratory:  Negative for cough.   Cardiovascular:  Negative for chest pain.  Gastrointestinal:  Negative for abdominal pain, anorexia, change in bowel habit, nausea and vomiting.  Musculoskeletal:  Negative for arthralgias, joint swelling, myalgias and neck  pain.  Skin:  Negative for rash.  Neurological:  Positive for dizziness. Negative for vertigo, weakness, numbness and headaches.      Objective:    Physical Exam Vitals reviewed.  Constitutional:      General: She is not in acute distress.    Appearance: She is well-developed. She is not diaphoretic.     Interventions: She is not intubated. HENT:     Head:  Normocephalic and atraumatic.     Right Ear: External ear normal.     Left Ear: External ear normal.     Nose: Nose normal.     Mouth/Throat:     Pharynx: No oropharyngeal exudate.  Eyes:     General: Lids are normal. No scleral icterus.       Right eye: No discharge.        Left eye: No discharge.     Conjunctiva/sclera: Conjunctivae normal.     Right eye: Right conjunctiva is not injected. No exudate or hemorrhage.    Left eye: Left conjunctiva is not injected. No exudate or hemorrhage.    Pupils: Pupils are equal, round, and reactive to light.  Neck:     Thyroid: No thyroid mass or thyromegaly.     Vascular: Normal carotid pulses. No carotid bruit, hepatojugular reflux or JVD.     Trachea: Trachea and phonation normal. No tracheal tenderness or tracheal deviation.     Meningeal: Brudzinski's sign and Kernig's sign absent.  Cardiovascular:     Rate and Rhythm: Normal rate and regular rhythm.     Pulses: Normal pulses.          Radial pulses are 2+ on the right side and 2+ on the left side.       Dorsalis pedis pulses are 2+ on the right side and 2+ on the left side.       Posterior tibial pulses are 2+ on the right side and 2+ on the left side.     Heart sounds: Normal heart sounds, S1 normal and S2 normal. Heart sounds not distant. No murmur heard.   No friction rub. No gallop.  Pulmonary:     Effort: Pulmonary effort is normal. No tachypnea, bradypnea, accessory muscle usage or respiratory distress. She is not intubated.     Breath sounds: Normal breath sounds. No stridor. No wheezing or rales.  Chest:     Chest  wall: No tenderness.  Abdominal:     General: Bowel sounds are normal. There is no distension or abdominal bruit.     Palpations: Abdomen is soft. There is no shifting dullness, fluid wave, hepatomegaly, splenomegaly, mass or pulsatile mass.     Tenderness: There is no abdominal tenderness. There is no guarding or rebound.     Hernia: No hernia is present.  Musculoskeletal:        General: No tenderness or deformity. Normal range of motion.     Cervical back: Full passive range of motion without pain, normal range of motion and neck supple. No edema, erythema or rigidity. No spinous process tenderness or muscular tenderness. Normal range of motion.  Lymphadenopathy:     Head:     Right side of head: No submental, submandibular, tonsillar, preauricular, posterior auricular or occipital adenopathy.     Left side of head: No submental, submandibular, tonsillar, preauricular, posterior auricular or occipital adenopathy.     Cervical: No cervical adenopathy.     Right cervical: No superficial, deep or posterior cervical adenopathy.    Left cervical: No superficial, deep or posterior cervical adenopathy.     Upper Body:     Right upper body: No supraclavicular or pectoral adenopathy.     Left upper body: No supraclavicular or pectoral adenopathy.  Skin:    General: Skin is warm and dry.     Coloration: Skin is not pale.     Findings: No abrasion, bruising, burn, ecchymosis, erythema, lesion, petechiae or rash.  Nails: There is no clubbing.  Neurological:     Mental Status: She is alert and oriented to person, place, and time.     GCS: GCS eye subscore is 4. GCS verbal subscore is 5. GCS motor subscore is 6.     Cranial Nerves: No cranial nerve deficit.     Sensory: No sensory deficit.     Motor: No tremor, atrophy, abnormal muscle tone or seizure activity.     Coordination: Coordination normal.     Gait: Gait normal.     Deep Tendon Reflexes: Reflexes are normal and symmetric. Reflexes  normal. Babinski sign absent on the right side. Babinski sign absent on the left side.     Reflex Scores:      Tricep reflexes are 2+ on the right side and 2+ on the left side.      Bicep reflexes are 2+ on the right side and 2+ on the left side.      Brachioradialis reflexes are 2+ on the right side and 2+ on the left side.      Patellar reflexes are 2+ on the right side and 2+ on the left side.      Achilles reflexes are 2+ on the right side and 2+ on the left side. Psychiatric:        Speech: Speech normal.        Behavior: Behavior normal.        Thought Content: Thought content normal.        Judgment: Judgment normal.   Vitals with BMI 12/14/2020 11/19/2020 09/14/2020  Height 5' 4.016" - 5\' 4"   Weight 141 lbs 3 oz - 143 lbs  BMI 24.23 - 24.53  Systolic 115 124 -  Diastolic 72 78 -  Pulse 86 89 -   Blood pressure 115/72 heart rate 86. BP 115/72   Pulse 86   Temp (!) 96.5 F (35.8 C)   Ht 5' 4.02" (1.626 m)   Wt 141 lb 3.2 oz (64 kg)   SpO2 97%   BMI 24.22 kg/m  Wt Readings from Last 3 Encounters:  12/14/20 141 lb 3.2 oz (64 kg)  09/14/20 143 lb (64.9 kg)  06/17/20 143 lb (64.9 kg)    Health Maintenance Due  Topic Date Due   COVID-19 Vaccine (5 - Booster for Pfizer series) 06/23/2020   INFLUENZA VACCINE  08/29/2020    There are no preventive care reminders to display for this patient.   Lab Results  Component Value Date   TSH 2.79 12/14/2020   Lab Results  Component Value Date   WBC 6.7 12/14/2020   HGB 13.4 12/14/2020   HCT 40.9 12/14/2020   MCV 89.5 12/14/2020   PLT 270.0 12/14/2020   Lab Results  Component Value Date   NA 136 12/14/2020   K 4.1 12/14/2020   CO2 28 12/14/2020   GLUCOSE 92 12/14/2020   BUN 10 12/14/2020   CREATININE 0.65 12/14/2020   BILITOT 0.5 12/14/2020   ALKPHOS 58 12/14/2020   AST 18 12/14/2020   ALT 20 12/14/2020   PROT 6.6 12/14/2020   ALBUMIN 4.4 12/14/2020   CALCIUM 9.1 12/14/2020   ANIONGAP 9 06/17/2020   GFR  79.91 12/14/2020   Lab Results  Component Value Date   CHOL 195 06/01/2020   Lab Results  Component Value Date   HDL 86.80 06/01/2020   Lab Results  Component Value Date   LDLCALC 92 06/01/2020   Lab Results  Component Value Date  TRIG 81.0 06/01/2020   Lab Results  Component Value Date   CHOLHDL 2 06/01/2020   Lab Results  Component Value Date   HGBA1C 5.6 02/10/2019       Assessment & Plan:   Problem List Items Addressed This Visit       Cardiovascular and Mediastinum   Low blood pressure reading   Relevant Medications   hydrochlorothiazide (HYDRODIURIL) 25 MG tablet     Other   Dizziness - Primary   Relevant Medications   hydrochlorothiazide (HYDRODIURIL) 25 MG tablet   Other Relevant Orders   CBC with Differential/Platelet (Completed)   Comprehensive metabolic panel (Completed)   TSH (Completed)   Labs today  Medications Discontinued During This Encounter  Medication Reason   cephALEXin (KEFLEX) 500 MG capsule Completed Course   triamterene-hydrochlorothiazide (MAXZIDE-25) 37.5-25 MG tablet Completed Course    She will continue the amlodipine 2.5 mg once daily and will switch her to HCTZ only starting with half tablet and if blood pressure elevated will increase to whole 25 mg tablet po once daily.    Meds ordered this encounter  Medications   hydrochlorothiazide (HYDRODIURIL) 25 MG tablet    Sig: Take 0.5 tablets (12.5 mg total) by mouth daily.    Dispense:  90 tablet    Refill:  0    Return in about 2 weeks (around 12/28/2020), or if symptoms worsen or fail to improve, for at any time for any worsening symptoms, Go to Emergency room/ urgent care if worse.  Advised patient call the office or your primary care doctor for an appointment if no improvement within 72 hours or if any symptoms change or worsen at any time  Advised ER or urgent Care if after hours or on weekend. Call 911 for emergency symptoms at any time.Patinet verbalized understanding  of all instructions given/reviewed and treatment plan and has no further questions or concerns at this time.      Jairo Ben, FNP

## 2020-12-14 NOTE — Telephone Encounter (Signed)
Spoke with pt and scheduled her for a visit with Marcelino Duster on 12/14/2020.

## 2020-12-14 NOTE — Patient Instructions (Signed)
Hypotension °As your heart beats, it forces blood through your body. This force is called blood pressure. If you have hypotension, you have low blood pressure.  °When your blood pressure is too low, you may not get enough blood to your brain or other parts of your body. This may cause you to feel weak, light-headed, have a fast heartbeat, or even faint. Low blood pressure may be harmless, or it may cause serious problems. °What are the causes? °Blood loss. °Not enough water in the body (dehydration). °Heart problems. °Hormone problems. °Pregnancy. °A very bad infection. °Not having enough of certain nutrients. °Very bad allergic reactions. °Certain medicines. °What increases the risk? °Age. The risk increases as you get older. °Conditions that affect the heart or the brain and spinal cord (central nervous system). °What are the signs or symptoms? °Feeling: °Weak. °Light-headed. °Dizzy. °Tired (fatigued). °Blurred vision. °Fast heartbeat. °Fainting, in very bad cases. °How is this treated? °Changing your diet. This may involve drinking more water or including more salt (sodium) in your diet by eating high-salt foods. °Taking medicines to raise your blood pressure. °Changing how much you take (the dosage) of some of your medicines. °Wearing compression stockings. These stockings help to prevent blood clots and reduce swelling in your legs. °In some cases, you may need to go to the hospital to: °Receive fluids through an IV tube. °Receive donated blood through an IV tube (transfusion). °Get treated for an infection or heart problems, if this applies. °Be monitored while medicines that you are taking wear off. °Follow these instructions at home: °Eating and drinking ° °Drink enough fluids to keep your pee (urine) pale yellow. °Eat a healthy diet. Follow instructions from your doctor about what you can eat or drink. A healthy diet includes: °Fresh fruits and vegetables. °Whole grains. °Low-fat (lean) meats. °Low-fat  dairy products. °If told, include more salt in your diet. Do not add extra salt to your diet unless your doctor tells you to. °Eat small meals often. °Avoid standing up quickly after you eat. °Medicines °Take over-the-counter and prescription medicines only as told by your doctor. °Follow instructions from your doctor about changing how much you take of your medicines, if this applies. °Do not stop or change any of your medicines on your own. °General instructions ° °Wear compression stockings as told by your doctor. °Get up slowly from lying down or sitting. °Avoid hot showers and a lot of heat as told by your doctor. °Return to your normal activities when your doctor says that it is safe. °Do not smoke or use any products that contain nicotine or tobacco. If you need help quitting, ask your doctor. °Keep all follow-up visits. °Contact a doctor if: °You vomit. °You have watery poop (diarrhea). °You have a fever for more than 2-3 days. °You feel more thirsty than normal. °You feel weak and tired. °Get help right away if: °You have chest pain. °You have a fast or uneven heartbeat. °You lose feeling (have numbness) in any part of your body. °You cannot move your arms or your legs. °You have trouble talking. °You get sweaty or feel light-headed. °You faint. °You have trouble breathing. °You have trouble staying awake. °You feel mixed up (confused). °These symptoms may be an emergency. Get help right away. Call 911. °Do not wait to see if the symptoms will go away. °Do not drive yourself to the hospital. °Summary °Hypotension is also called low blood pressure. It is when the force of blood pumping through your body   is too weak. °Hypotension may be harmless, or it may cause serious problems. °Treatment may include changing your diet and medicines, and wearing compression stockings. °In very bad cases, you may need to go to the hospital. °This information is not intended to replace advice given to you by your health care  provider. Make sure you discuss any questions you have with your health care provider. °Document Revised: 09/05/2020 Document Reviewed: 09/05/2020 °Elsevier Patient Education © 2022 [employer] ° °

## 2020-12-15 NOTE — Progress Notes (Signed)
Labs within normal limits

## 2020-12-18 ENCOUNTER — Encounter: Payer: Self-pay | Admitting: Adult Health

## 2020-12-18 DIAGNOSIS — R031 Nonspecific low blood-pressure reading: Secondary | ICD-10-CM | POA: Insufficient documentation

## 2020-12-28 DIAGNOSIS — L648 Other androgenic alopecia: Secondary | ICD-10-CM | POA: Diagnosis not present

## 2020-12-28 DIAGNOSIS — L820 Inflamed seborrheic keratosis: Secondary | ICD-10-CM | POA: Diagnosis not present

## 2020-12-28 DIAGNOSIS — B360 Pityriasis versicolor: Secondary | ICD-10-CM | POA: Diagnosis not present

## 2021-01-05 ENCOUNTER — Ambulatory Visit (INDEPENDENT_AMBULATORY_CARE_PROVIDER_SITE_OTHER): Payer: Medicare PPO | Admitting: Internal Medicine

## 2021-01-05 ENCOUNTER — Other Ambulatory Visit: Payer: Self-pay

## 2021-01-05 ENCOUNTER — Encounter: Payer: Self-pay | Admitting: Internal Medicine

## 2021-01-05 ENCOUNTER — Ambulatory Visit (INDEPENDENT_AMBULATORY_CARE_PROVIDER_SITE_OTHER): Payer: Medicare PPO

## 2021-01-05 VITALS — BP 120/68 | HR 74 | Temp 96.6°F | Ht 64.0 in | Wt 140.0 lb

## 2021-01-05 DIAGNOSIS — R0781 Pleurodynia: Secondary | ICD-10-CM | POA: Diagnosis not present

## 2021-01-05 DIAGNOSIS — R031 Nonspecific low blood-pressure reading: Secondary | ICD-10-CM

## 2021-01-05 DIAGNOSIS — M5412 Radiculopathy, cervical region: Secondary | ICD-10-CM | POA: Diagnosis not present

## 2021-01-05 DIAGNOSIS — G8929 Other chronic pain: Secondary | ICD-10-CM

## 2021-01-05 DIAGNOSIS — M542 Cervicalgia: Secondary | ICD-10-CM

## 2021-01-05 DIAGNOSIS — Z8781 Personal history of (healed) traumatic fracture: Secondary | ICD-10-CM

## 2021-01-05 DIAGNOSIS — F411 Generalized anxiety disorder: Secondary | ICD-10-CM

## 2021-01-05 DIAGNOSIS — F5105 Insomnia due to other mental disorder: Secondary | ICD-10-CM

## 2021-01-05 DIAGNOSIS — S2241XD Multiple fractures of ribs, right side, subsequent encounter for fracture with routine healing: Secondary | ICD-10-CM | POA: Diagnosis not present

## 2021-01-05 DIAGNOSIS — I700 Atherosclerosis of aorta: Secondary | ICD-10-CM | POA: Diagnosis not present

## 2021-01-05 DIAGNOSIS — E785 Hyperlipidemia, unspecified: Secondary | ICD-10-CM

## 2021-01-05 DIAGNOSIS — I10 Essential (primary) hypertension: Secondary | ICD-10-CM

## 2021-01-05 DIAGNOSIS — F409 Phobic anxiety disorder, unspecified: Secondary | ICD-10-CM

## 2021-01-05 LAB — LIPID PANEL
Cholesterol: 166 mg/dL (ref 0–200)
HDL: 89.7 mg/dL (ref >39.00)
LDL Cholesterol: 58 mg/dL (ref 0–99)
NonHDL: 75.95
Total CHOL/HDL Ratio: 2
Triglycerides: 88 mg/dL (ref 0.0–149.0)
VLDL: 17.6 mg/dL (ref 0.0–40.0)

## 2021-01-05 MED ORDER — ZOLPIDEM TARTRATE 5 MG PO TABS
ORAL_TABLET | ORAL | 5 refills | Status: DC
Start: 2021-01-05 — End: 2021-02-01

## 2021-01-05 NOTE — Patient Instructions (Addendum)
I recommend that you stop the triamterene/hct and do not start the hctz  Continue amlodipine  Check BP once a week . And send me 4 readings  Start taking a baby aspirin once a week.  Continue lipitor at night ,  ok to have grapefruit in the morning   Referral to Physical therapy for neck pain

## 2021-01-05 NOTE — Progress Notes (Signed)
Patient ID: Laura [name], female    DOB: [birth year]  Age: 87 y.o. MRN: [id number]  The patient is here for annual preventive  examination and management of other chronic and acute problems.   The risk factors are reflected in the social history.  The roster of all physicians providing medical care to patient - is listed in the Snapshot section of the chart.  Activities of daily living:  The patient is 100% independent in all ADLs: dressing, toileting, feeding as well as independent mobility  Home safety : The patient has smoke detectors in the home. They wear seatbelts.  There are no firearms at home. There is no violence in the home.   There is no risks for hepatitis, STDs or HIV. There is no   history of blood transfusion. They have no travel history to infectious disease endemic areas of the world.  The patient has seen their dentist in the last six month. They have seen their eye doctor in the last year. They admit to slight hearing difficulty with regard to whispered voices and some television programs.  They have deferred audiologic testing in the last year.  They do not  have excessive sun exposure. Discussed the need for sun protection: hats, long sleeves and use of sunscreen if there is significant sun exposure.   Diet: the importance of a healthy diet is discussed. They do have a healthy diet.  The benefits of regular aerobic exercise were discussed. She walks 4 times per week ,  20 minutes.   Depression screen: there are no signs or vegative symptoms of depression- irritability, change in appetite, anhedonia, sadness/tearfullness.  Cognitive assessment: the patient manages all their financial and personal affairs and is actively engaged. They could relate day,date,year and events; recalled 2/3 objects at 3 minutes; performed clock-face test normally.  The following portions of the patient's history were reviewed and updated as appropriate: allergies, current medications, past  family history, past medical history,  past surgical history, past social history  and problem list.  Visual acuity was not assessed per patient preference since she has regular follow up with her ophthalmologist. Hearing and body mass index were assessed and reviewed.   During the course of the visit the patient was educated and counseled about appropriate screening and preventive services including : fall prevention , diabetes screening, nutrition counseling, colorectal cancer screening, and recommended immunizations.    CC: The primary encounter diagnosis was Rib pain on right side. Diagnoses of Neck pain, chronic, Hyperlipidemia LDL goal <70, Low blood pressure reading, History of rib fracture, Cervical radiculopathy, Primary hypertension, Generalized anxiety disorder, and Insomnia due to anxiety and fear were also pertinent to this visit.  1)  Treated in November by Marcelino Duster for ecent episodes of BPPV brought on by standing up abruptly.  BP was 112/58, 120/73,  long west was 109 /58 .  Home machine was checked recently and overestimating systolic pressure by 5 pts.  Symptoms were occurring nearly daily in November,  but have been occurring less  frequently in the last 2 weeks.   No medication changes have been made Marcelino Duster changed her antihypertensive from maxzide to 12.5 mg hctz but patient  has not made the change )    2) aortic atherosclerosis:   CT reviewed,   has been tolerating lipitor .  3)intermittent pain in right ear for the past 2 weeks.  Wearing hearing aids bilaterally.   Has not swam since July.  Using swimmer's ear drops  for the past few days.  Described as sharp and intermittent  involves the scalp behind the ear as well.  Has chronic neck pain aggravated by prolonged periods of use of laptop  4) right  upper thoracic rib cage tenderness . History of rib fracture in 2021,  not sure wheich side.  The pai is not severe but is disrupting her sleep as she is a right side sleeper.   Does not hurt with deep breathing or with exercise.   5) INSOMNIA,  she request  AMBIEN  REFILL  [name] [name] has a past medical history of Accidental fall (10/16/2013), Anxiety, Carotid stenosis, Fracture of rib (09/26/2019), High cholesterol, Hypertension, Migraine with aura, Neuropathy, and Osteoporosis.   She has a past surgical history that includes Tubal ligation; Abdominal hysterectomy; Tonsillectomy; and Appendectomy.   Her family history includes Breast cancer (age of onset: 37) in her daughter; Breast cancer (age of onset: 46) in her sister; Breast cancer (age of onset: 2) in her mother; Cancer in her daughter. She was adopted.She reports that she quit smoking about 57 years ago. Her smoking use included cigarettes. She has never used smokeless tobacco. She reports current alcohol use of about 14.0 standard drinks per week. She reports that she does not use drugs.  Outpatient Medications Prior to Visit  Medication Sig Dispense Refill   ALPRAZolam (XANAX) 0.5 MG tablet Take 0.5 tablets (0.25 mg total) by mouth 2 (two) times daily as needed for anxiety or sleep. 30 tablet 0   amLODipine (NORVASC) 2.5 MG tablet TAKE 1 TABLET(2.5 MG) BY MOUTH DAILY 90 tablet 1   calcium carbonate (OS-CAL) 600 MG TABS Take 600 mg by mouth 2 (two) times daily with a meal.     Cholecalciferol (VITAMIN D3) 50 MCG (2000 UT) CAPS Take 1 capsule by mouth daily.     Coenzyme Q10 (CO Q 10) 100 MG CAPS Take 1 capsule by mouth daily.     Cyanocobalamin 2500 MCG SUBL PLACE 1 TABLET UNDER THE TONGUE DAILY AS DIRECTED 90 tablet 1   denosumab (PROLIA) 60 MG/ML SOLN injection Inject 60 mg into the skin every 6 (six) months. Administer in upper arm, thigh, or abdomen     escitalopram (LEXAPRO) 10 MG tablet TAKE 1 TABLET(10 MG) BY MOUTH DAILY 90 tablet 0   magnesium oxide (MAG-OX) 400 MG tablet Take 400 mg by mouth 2 (two) times daily.     Multiple Vitamins-Minerals (PRESERVISION AREDS 2 PO) Take 1 capsule by mouth daily.      nitroGLYCERIN (NITROSTAT) 0.4 MG SL tablet Place 1 tablet (0.4 mg total) under the tongue every 5 (five) minutes as needed for chest pain. 50 tablet 3   psyllium (METAMUCIL) 58.6 % packet Take 1 packet by mouth daily.     Riboflavin 100 MG CAPS Take 1 capsule by mouth 2 (two) times daily.      hydrochlorothiazide (HYDRODIURIL) 25 MG tablet Take 0.5 tablets (12.5 mg total) by mouth daily. 90 tablet 0   zolpidem (AMBIEN) 10 MG tablet TAKE 1 TABLET(10 MG) BY MOUTH AT BEDTIME 30 tablet 5   benzonatate (TESSALON) 200 MG capsule Take 1 capsule (200 mg total) by mouth 3 (three) times daily as needed for cough. (Patient not taking: Reported on 01/05/2021) 30 capsule 0   guaiFENesin-codeine (CHERATUSSIN AC) 100-10 MG/5ML syrup Take 5 mLs by mouth 3 (three) times daily as needed for cough. (Patient not taking: Reported on 01/05/2021) 120 mL 0   Facility-Administered Medications Prior to Visit  Medication Dose Route Frequency Provider Last Rate Last Admin   denosumab (PROLIA) injection 60 mg  60 mg Subcutaneous Once Crecencio Mc, MD        Review of Systems  Patient denies headache, fevers, malaise, unintentional weight loss, skin rash, eye pain, sinus congestion and sinus pain, sore throat, dysphagia,  hemoptysis , cough, dyspnea, wheezing, chest pain, palpitations, orthopnea, edema, abdominal pain, nausea, melena, diarrhea, constipation, flank pain, dysuria, hematuria, urinary  Frequency, nocturia, numbness, tingling, seizures,  Focal weakness, Loss of consciousness,  Tremor, insomnia, depression, anxiety, and suicidal ideation.     Objective:  BP 120/68 (BP Location: Left Arm, Patient Position: Sitting, Cuff Size: Normal)   Pulse 74   Temp (!) 96.6 F (35.9 C) (Temporal)   Ht 5\' 4"  (1.626 m)   Wt 140 lb (63.5 kg)   SpO2 97%   BMI 24.03 kg/m   Physical Exam   General appearance: alert, cooperative and appears stated age Head: Normocephalic, without obvious abnormality, atraumatic Eyes:  conjunctivae/corneas clear. PERRL, EOM's intact. Fundi benign. Ears: normal TM's and external ear canals both ears Nose: Nares normal. Septum midline. Mucosa normal. No drainage or sinus tenderness. Throat: lips, mucosa, and tongue normal; teeth and gums normal Neck: no adenopathy, no carotid bruit, no JVD, supple, symmetrical, trachea midline and thyroid not enlarged, symmetric, no tenderness/mass/nodules Lungs: clear to auscultation bilaterally Back:  point tenderness over right 3rd rib just posterior to the mid axillary line.  No bruising  Breasts: normal appearance, no masses or tenderness Heart: regular rate and rhythm, S1, S2 normal, no murmur, click, rub or gallop Abdomen: soft, non-tender; bowel sounds normal; no masses,  no organomegaly Extremities: extremities normal, atraumatic, no cyanosis or edema Pulses: 2+ and symmetric Skin: Skin color, texture, turgor normal. No rashes or lesions Neurologic: Alert and oriented X 3, normal strength and tone. Normal symmetric reflexes. Normal coordination and gait.  MSK:  normal.  ROM of all 4 limbs.  . Restricted ROM of neck   No pain elicited with forced abduction, adduction of right arm.      Assessment & Plan:   Problem List Items Addressed This Visit     Low blood pressure reading    Recommended that she dc mazxide and continue amlodipine only pending repeat evaluation of BP       Insomnia due to anxiety and fear    I have reviewed the dangers of prescribing this drug to the elderly with patient.  She has been [place] dependent for over two years, and prior attempts to use an alternative medication  have been unsuccessful resulting in loss of sleep..She is willing to accept the risks of adverse events. Refilled today       Hyperlipidemia LDL goal <70   Relevant Orders   Lipid panel (Completed)   HTN (hypertension)    Stopping diuretic for relative hypotension       History of rib fracture    Plain films today reveal an old  fracture of the right third rib posteriorly which localizes to the  area of pain .       Generalized anxiety disorder    Recent episodes of panic attacks have resolved with use of lexapro.  She does not fear death,  Only disability from stroke or other disease that would render her unable to care for herself.  anxiety about health.      Cervical radiculopathy    Suspected as the cause of her intermittent right ear pain given  Her normal ear exam and plain films showing degenerative changes . PT referral made.       Relevant Medications   zolpidem (AMBIEN) 5 MG tablet   Other Visit Diagnoses     Rib pain on right side    -  Primary   Relevant Orders   DG Ribs Unilateral Right (Completed)   Neck pain, chronic       Relevant Orders   DG Cervical Spine Complete (Completed)   Ambulatory referral to Physical Therapy       I have discontinued Brae J. Vanpatten "B Tolen"'s hydrochlorothiazide. I have also changed her zolpidem. Additionally, I am having her maintain her calcium carbonate, Riboflavin, Co Q 10, denosumab, magnesium oxide, nitroGLYCERIN, Cyanocobalamin, Multiple Vitamins-Minerals (PRESERVISION AREDS 2 PO), vitamin D3, psyllium, ALPRAZolam, benzonatate, Cheratussin AC, escitalopram, and amLODipine. We will continue to administer denosumab.  Meds ordered this encounter  Medications   zolpidem (AMBIEN) 5 MG tablet    Sig: TAKE 1 TABLET(10 MG) BY MOUTH AT BEDTIME    Dispense:  30 tablet    Refill:  5    Medications Discontinued During This Encounter  Medication Reason   zolpidem (AMBIEN) 10 MG tablet Reorder   hydrochlorothiazide (HYDRODIURIL) 25 MG tablet     Follow-up: No follow-ups on file.   Crecencio Mc, MD

## 2021-01-07 DIAGNOSIS — M5412 Radiculopathy, cervical region: Secondary | ICD-10-CM | POA: Insufficient documentation

## 2021-01-07 DIAGNOSIS — Z8781 Personal history of (healed) traumatic fracture: Secondary | ICD-10-CM | POA: Insufficient documentation

## 2021-01-07 NOTE — Assessment & Plan Note (Signed)
Recommended that she dc mazxide and continue amlodipine only pending repeat evaluation of BP

## 2021-01-07 NOTE — Assessment & Plan Note (Signed)
Stopping diuretic for relative hypotension

## 2021-01-07 NOTE — Assessment & Plan Note (Signed)
Recent episodes of panic attacks have resolved with use of lexapro.  She does not fear death,  Only disability from stroke or other disease that would render her unable to care for herself.  anxiety about health.

## 2021-01-07 NOTE — Assessment & Plan Note (Addendum)
Plain films today reveal an old fracture of the right third rib posteriorly which localizes to the  area of pain .

## 2021-01-07 NOTE — Assessment & Plan Note (Signed)
I have reviewed the dangers of prescribing this drug to the elderly with patient.  She has been [place] dependent for over two years, and prior attempts to use an alternative medication  have been unsuccessful resulting in loss of sleep..She is willing to accept the risks of adverse events. Refilled today

## 2021-01-07 NOTE — Assessment & Plan Note (Signed)
Suspected as the cause of her intermittent right ear pain given  Her normal ear exam and plain films showing degenerative changes . PT referral made.

## 2021-01-08 ENCOUNTER — Encounter: Payer: Self-pay | Admitting: Internal Medicine

## 2021-01-11 ENCOUNTER — Encounter: Payer: Self-pay | Admitting: Internal Medicine

## 2021-01-17 ENCOUNTER — Ambulatory Visit: Payer: Medicare PPO | Attending: Internal Medicine | Admitting: Physical Therapy

## 2021-01-17 ENCOUNTER — Encounter: Payer: Self-pay | Admitting: Physical Therapy

## 2021-01-17 DIAGNOSIS — G8929 Other chronic pain: Secondary | ICD-10-CM | POA: Insufficient documentation

## 2021-01-17 DIAGNOSIS — M62838 Other muscle spasm: Secondary | ICD-10-CM | POA: Insufficient documentation

## 2021-01-17 DIAGNOSIS — R42 Dizziness and giddiness: Secondary | ICD-10-CM | POA: Diagnosis not present

## 2021-01-17 DIAGNOSIS — M542 Cervicalgia: Secondary | ICD-10-CM | POA: Insufficient documentation

## 2021-01-17 NOTE — Therapy (Signed)
[facility] PH[facility] MEDICINE [address], [place], [address] Phone: [phone]   Fax:  [phone]  Physical Therapy Evaluation  Patient Details  Name: Laura [name] MRN: [id number] Date of Birth: 08-30-1932 Referring Provider (PT): Sherlene Shams, MD   Encounter Date: 01/17/2021   PT End of Session - 01/18/21 1208     Visit Number 1    Number of Visits 24    Date for PT Re-Evaluation 04/11/21    Authorization Type [employer]IC[employer] reporting period from 01/17/2021    Authorization Time Period preauth is required    Authorization - Visit Number 1    Authorization - Number of Visits 1    Progress Note Due on Visit 10    PT Start Time 1345    PT Stop Time 1430    PT Time Calculation (min) 45 min    Activity Tolerance Patient tolerated treatment well    Behavior During Therapy [facility] for tasks assessed/performed             Past Medical History:  Diagnosis Date   Accidental fall 10/16/2013   Anxiety    Carotid stenosis    Left    Fracture of rib 09/26/2019   High cholesterol    Hypertension    Pt states that her blood pressure is very well controlled   Migraine with aura    Neuropathy    Osteoporosis     Past Surgical History:  Procedure Laterality Date   ABDOMINAL HYSTERECTOMY     secondary to peritonitis and complications    APPENDECTOMY     TONSILLECTOMY     TUBAL LIGATION     at age 31, vaginal complicated by peritonitis    There were no vitals filed for this visit.    Subjective Assessment - 01/17/21 1409     Subjective Patient states at a recent visit with Dr. Darrick Huntsman she was having some ear pain and mentioned some stiffness and tightness in her neck that she agrees is uncomfortable but she does not thing of as pain. This resulted in Dr. Darrick Huntsman recommending she come to PT. Chart review shows that Dr. Darrick Huntsman suspected cervical spine pain referral to R ear as source of pain. Patient states she has  been having intermittent R ear pain but she has not had the ear pain since she saw Dr. Darrick Huntsman and did not realize that the doctor had connected it to her neck pain. She thinks she has arthritis in her neck and it has hurt in her neck. She states she has neck pain at ghe base of her scull that feels like her skin is too tight. Patient reports she would like address neck pain and stiffness. Patient report she has had stiffness in the neck for a long time. She reports it feels the same on both sides and she feels it in her suboccipital region. She had some sharp pain in her right ear and she took drops for swimmer's ear and that helped intermittently. It has been 2-3 weeks since it has happened again. She spends a lot of time of reading.   Patient states she has had BPPV repeatedly over time. She states her daughter, Marcelino Duster is a PT who has a clinic in [place] that specializes in vestibular disorders  and she has treated patient's horizontal canal with with the BBQ roll successfully in the past. She states her daughter is certain she has horizontal canal BPPV. She has been  to PT intermittently for this over quite some time. She states is she goes off to the right. She kept veering to the right and hitting the wall. Patient states she was recently treated at [facility] clinic over Thanksgiving and the PT treating her found something using Frenzel goggles but patient is not quite clear on what it was. She was given exercises and that helped but now she thinks it is coming back. She has noticed the symptoms come on within 2-5 seconds now instead of instantly. She does yoga by [facility] through [facility].  She notices that when she does the triangle pose on the left it is okay. When she does triangle pose to the right she is also okay until she turns her head to look up at the sky. She states her vestibular symptoms are not severe enough to make her fall. She initially stated she wanted to continuing getting  vestibular issues addressed with her daughter's clinic, but later seemed open to being treated at the present clinic due to worsening symptoms and proximity of care. She understood that present PT order does not include vestibular issues but that it could be requested from Dr. Darrick Huntsman. She is unclear whether she has been to an ENT before. Marcelino Duster is coming on christmas eve but patient does not really want to tell her about her increased dizziness symptoms. Her BP has been fluxuating since April. It was high and about 3 weeks ago it started being low. She took her BP and it was 116/58 mmHg. She is wondering if any of this (dizziness or neck pain) is tied to her blood pressure. She states she gets occasional headaches.    Pertinent History Patient is a 87 y.o. female who presents to outpatient physical therapy with a referral for medical diagnosis chronic neck pain. This patient's chief complaints consist of neck stiffness and discomfort leading to the following functional deficits: restricts reading and computer work, social activities requiring computer, moving head quickly, turning to look behind her , bending over to clean a bathtub, looking up to see a bird, changing a light bulb overhead. .  Relevant past medical history and comorbidities include peripheral neuropathy, migraine aura including a few min of aphasia following (possible TIA), carotid stenosis (she has had conflicting advice and dx on this).  Patient denies hx of cancer, confirmed stroke, seizures, lung problem, major cardiac events, diabetes, unexplained weight loss (last year did but has been cleared), new changes in bowel or bladder problems, new onset stumbling. Has dropped things more than usual.    Limitations Other (comment);House hold activities   Functional Limitations: restricts reading and computer work, social activities requiring computer, moving head quickly, turning to look behind her , bending over to clean a bathtub, looking up to  see a bird, changing a light bulb overhead.   Currently in Pain? Yes    Pain Score 3    W: 5/10: B: 3/10   Pain Location Neck    Pain Orientation Right;Left    Pain Descriptors / Indicators Tightness   Like her head is too big for her skin,   Pain Type Chronic pain    Pain Radiating Towards Intermittant right ear pain possibly. It has been 2-3 weeks since it has happened again.    Pain Onset More than a month ago    Pain Frequency Constant    Aggravating Factors  waking up in the morning, leaning over, reading, looking down,    Pain Relieving Factors holding book  higher, frozen beads over the neck    Effect of Pain on Daily Activities Functional Limitations: restricts reading and computer work, social activities requiring computer, moving head quickly, turning to look behind her , bending over to clean a bathtub, looking up to see a bird, changing a lightbulb overhead.              [facility] PT Assessment - 01/18/21 0001       Assessment   Medical Diagnosis chronic neck pain    Referring Provider (PT) Sherlene Shams, MD    Onset Date/Surgical Date --   chronic   Prior Therapy vesibular PT      Precautions   Precautions Fall      Balance Screen   Has the patient fallen in the past 6 months No    Has the patient had a decrease in activity level because of a fear of falling?  Yes    Is the patient reluctant to leave their home because of a fear of falling?  Yes      Home Environment   Living Environment --   feels safe getting around home safely     Prior Function   Level of Independence Independent   except housework     Cognition   Overall Cognitive Status Within Functional Limits for tasks assessed              OBJECTIVE  SELF- REPORTED FUNCTION FOTO score: 45/100 (neck questionnaire)  OBSERVATION/INSPECTION Posture Posture (seated): forward head, rounded shoulders, increased upper thoracic kyphosis Anthropometrics Tremor: none Body composition: BMI:  23.3 Muscle bulk: no gross asymmetry Functional Mobility Bed mobility: deferred Transfers:  sit <> stand I with some unsteadiness Gait: grossly WFL for household and short community ambulation except some unsteadiness just after rising. More detailed gait  SPINE MOTION  Cervical Spine AROM *Indicates pain Flexion: 60 Extension: 45 pain Side Flexion:   R 20  L 20 Rotation:  R 50 L 28  PERIPHERAL JOINT MOTION (in degrees) Active Range of Motion (AROM) B UE appear WFL for basic activities.   MUSCLE PERFORMANCE (MMT):  *Indicates pain 01/17/21 Date Date  Joint/Motion R/L R/L R/L  Shoulder     Flexion 5/5 / /  Abduction (C5) 5/5 / /  External rotation 4+/4 / /  Internal rotation 5/5 / /  Elbow     Flexion (C6) 5/5 / /  Extension (C7) 5/5 / /  Hand     Thumb extension (C8) 4/4 / /  Finger abduction (T1) B WFL / /   SPECIAL TESTS: CERVICAL SPINE Cervical spine axial compression: negative  PALPATION: TTP grade I at B suboccipital region, tension noted at B UT  Objective measurements completed on examination: See above findings.      PT Education - 01/18/21 1208     Education Details Education on diagnosis, prognosis, POC, anatomy and physiology of current condition    Person(s) Educated Patient    Methods Explanation    Comprehension Verbalized understanding;Need further instruction              PT Short Term Goals - 01/18/21 1212       PT SHORT TERM GOAL #1   Title Be independent with initial home exercise program for self-management of symptoms.    Baseline initial HEP to be provided at visit 2 as appropriate (01/17/2021);    Time 2    Period Weeks    Status New    Target Date 02/01/21  PT Long Term Goals - 01/18/21 1212       PT LONG TERM GOAL #1   Title Be independent with a long-term home exercise program for self-management of symptoms.    Baseline initial HEP provided at visit 2 as appropriate (01/17/2021);    Time 12     Period Weeks    Status New   TARGET DATE FOR ALL LONG TERM GOALS: 04/11/2021     PT LONG TERM GOAL #2   Title Demonstrate improved FOTO to equal or greater than 60 by visit #10 to demonstrate improvement in overall condition and self-reported functional ability.    Baseline 45 (01/17/2021);    Time 12    Period Weeks    Status New      PT LONG TERM GOAL #3   Title Patient will demonstrate cervical spine AROM rotation to equal or greater than 60 degrees each way to improve ability to check blind spot while driving and view surroundings.    Baseline Rotation:   R 50  L 28 (01/17/2021);    Time 12    Period Weeks    Status New      PT LONG TERM GOAL #4   Title Pateint will score equal or greater than 25/30 on the Functional Gait Assessment to demonstrate low fall risk    Baseline to be measured visit 2 as appropriate (01/17/2021);    Time 12    Period Weeks    Status New      PT LONG TERM GOAL #5   Title Complete community, work and/or recreational activities without limitation due to current condition.    Baseline restricts reading and computer work, social activities requiring computer, moving head quickly, turning to look behind her , bending over to clean a bathtub, looking up to see a bird, changing a light bulb overhead (01/17/2021);    Time 12    Period Weeks    Status New                    Plan - 01/18/21 1221     Clinical Impression Statement Patient is a 87 y.o. female referred to outpatient physical therapy with a medical diagnosis of chronic neck pain who presents with signs and symptoms consistent with chronic neck pain and stiffness. Patient's R ear complaint appears to have resolved and was not provoked with cervical spine testing but will continue to monitor during subsequent sessions. Patient also appears to have dizziness that has recently worsened and has previously been diagnosed and treated as BPPV of a horizontal canal. Patient may benefit from PT  treatment for this condition but will need a PT order from advanced practice provider before it can be addressed (will contact MD). Patient presents with significant pain, stiffness, ROM, posture, muscle tension, muscle performance (strength/power/endurance) and activity tolerance impairments that are limiting ability to complete her usual activities including reading and computer work, social activities requiring computer, moving head quickly, turning to look behind her , bending over to clean a bathtub, looking up to see a bird, changing a light bulb overhead  without difficulty. Patient will benefit from skilled physical therapy intervention to address current body structure impairments and activity limitations to improve function and work towards goals set in current POC in order to return to prior level of function or maximal functional improvement.    Personal Factors and Comorbidities Age;Time since onset of injury/illness/exacerbation;Comorbidity 3+;Past/Current Experience    Comorbidities Relevant past medical history and comorbidities  include peripheral neuropathy, migraine aura including a few min of aphasia following (possible TIA), carotid stenosis (she has had conflicting advice and dx on this)    Examination-Activity Limitations Sit    Examination-Participation Restrictions Cleaning;Driving;Interpersonal Relationship   restricts reading and computer work, social activities requiring computer, moving head quickly, turning to look behind her , bending over to clean a bathtub, looking up to see a bird, changing a light bulb overhead.   Stability/Clinical Decision Making Stable/Uncomplicated    Clinical Decision Making Low    Rehab Potential Good    PT Frequency 2x / week    PT Duration 12 weeks    PT Treatment/Interventions ADLs/Self Care Home Management;Cryotherapy;Moist Heat;Canalith Repostioning;Therapeutic activities;Therapeutic exercise;Balance training;Neuromuscular re-education;Manual  techniques;Dry needling;Passive range of motion;Patient/family education    PT Next Visit Plan update HEP as appropriate, ROM, stretching, postural strengthening, consider vestibular exam if receive order from MD    PT Home Exercise Plan TBD    Recommended Other Services Vestibular PT    Consulted and Agree with Plan of Care Patient             Patient will benefit from skilled therapeutic intervention in order to improve the following deficits and impairments:  Dizziness, Pain, Decreased mobility, Increased muscle spasms, Postural dysfunction, Decreased activity tolerance, Decreased endurance, Decreased range of motion, Decreased strength, Hypomobility, Impaired perceived functional ability, Decreased balance  Visit Diagnosis: Cervicalgia  Other muscle spasm  Dizziness and giddiness     Problem List Patient Active Problem List   Diagnosis Date Noted   History of rib fracture 01/07/2021   Cervical radiculopathy 01/07/2021   Low blood pressure reading 12/18/2020   COVID-19 virus infection 09/14/2020   Gait disorder 06/04/2020   Aortic atherosclerosis (HCC) 05/02/2020   Altered bowel function 02/03/2020   Dizziness 12/31/2018   Degenerative joint disease (DJD) of hip 11/29/2018   Bursitis of left hip 05/31/2018   Encounter for preventive health examination 07/31/2016   Lumbar radiculitis 12/03/2014   DDD (degenerative disc disease), lumbar 12/03/2014   Intracranial arachnoid cyst 10/25/2014   Migraine aura without headache 04/24/2014   Encounter for Medicare annual wellness exam 11/01/2013   S/P hysterectomy with oophorectomy 01/03/2013   HTN (hypertension) 10/19/2012   B12 deficiency 04/16/2012   Anxiety state 04/16/2012   Generalized anxiety disorder 04/16/2012   Neuropathy of both feet 09/18/2011   Hyperlipidemia LDL goal <70 03/21/2011   Insomnia due to anxiety and fear 03/21/2011   Osteoporosis     Luretha Murphy. Ilsa Iha, PT, DPT 01/18/21, 12:25 PM   [facility] PHYS[facility] AND SPORTS MEDICINE [address] [place], [address] Phone: [phone]   Fax:  [phone]  Name: BRAELEY BUSKEY MRN: [id number] Date of Birth: Mar 11, 1932

## 2021-01-18 ENCOUNTER — Encounter: Payer: Self-pay | Admitting: Internal Medicine

## 2021-01-26 ENCOUNTER — Encounter: Payer: Self-pay | Admitting: Internal Medicine

## 2021-01-26 ENCOUNTER — Ambulatory Visit: Payer: Medicare PPO | Admitting: Physical Therapy

## 2021-01-26 DIAGNOSIS — M62838 Other muscle spasm: Secondary | ICD-10-CM

## 2021-01-26 DIAGNOSIS — M542 Cervicalgia: Secondary | ICD-10-CM

## 2021-01-26 DIAGNOSIS — G8929 Other chronic pain: Secondary | ICD-10-CM | POA: Diagnosis not present

## 2021-01-26 DIAGNOSIS — R42 Dizziness and giddiness: Secondary | ICD-10-CM

## 2021-01-26 NOTE — Therapy (Signed)
[facility] [address] [place], [address] Phone: [phone]   Fax:  [phone]  Physical Therapy Treatment  Patient Details  Name: Laura [name] MRN: [id number] Date of Birth: 1932-07-11 Referring Provider (PT): Sherlene Shams, MD   Encounter Date: 01/26/2021   PT End of Session - 01/26/21 1531     Visit Number 2    Number of Visits 24    Date for PT Re-Evaluation 04/11/21    Authorization Type [employer] MEDICARE reporting period from 01/17/2021    Authorization Time Period preauth is required    Authorization - Visit Number 1    Authorization - Number of Visits 1    Progress Note Due on Visit 10    PT Start Time 1522    PT Stop Time 1600    PT Time Calculation (min) 38 min    Activity Tolerance Patient tolerated treatment well    Behavior During Therapy [facility] for tasks assessed/performed             Past Medical History:  Diagnosis Date   Accidental fall 10/16/2013   Anxiety    Carotid stenosis    Left    Fracture of rib 09/26/2019   High cholesterol    Hypertension    Pt states that her blood pressure is very well controlled   Migraine with aura    Neuropathy    Osteoporosis     Past Surgical History:  Procedure Laterality Date   ABDOMINAL HYSTERECTOMY     secondary to peritonitis and complications    APPENDECTOMY     TONSILLECTOMY     TUBAL LIGATION     at age 15, vaginal complicated by peritonitis    There were no vitals filed for this visit.   Subjective Assessment - 01/26/21 1526     Subjective Patient report she has 3/10 pain in the suboccipital region upon arrival today.She state she cooks a lot and she had a lot of pain the day after her intial PT eval while making Gnocchi. She states her dizziness is better but not great.    Pertinent History Patient is a 87 y.o. female who presents to outpatient physical therapy with a referral for medical diagnosis chronic neck  pain. This patient's chief complaints consist of neck stiffness and discomfort leading to the following functional deficits: restricts reading and computer work, social activities requiring computer, moving head quickly, turning to look behind her , bending over to clean a bathtub, looking up to see a bird, changing a light bulb overhead. .  Relevant past medical history and comorbidities include peripheral neuropathy, migraine aura including a few min of aphasia following (possible TIA), carotid stenosis (she has had conflicting advice and dx on this).  Patient denies hx of cancer, confirmed stroke, seizures, lung problem, major cardiac events, diabetes, unexplained weight loss (last year did but has been cleared), new changes in bowel or bladder problems, new onset stumbling. Has dropped things more than usual.    Limitations Other (comment);House hold activities   Functional Limitations: restricts reading and computer work, social activities requiring computer, moving head quickly, turning to look behind her , bending over to clean a bathtub, looking up to see a bird, changing a light bulb overhead.   Currently in Pain? Yes    Pain Score 3     Pain Location Neck            TREATMENT:   Therapeutic exercise:  to centralize symptoms and improve ROM, strength, muscular endurance, and activity tolerance required for successful completion of functional activities.  - Upper body ergometer level 4 encourage joint nutrition, warm tissue, induce analgesic effect of aerobic exercise, improve muscular strength and endurance,  and prepare for remainder of session. 4 min total changing directions each minute. (She feels a lot of neck tension when she performs retro).  - supine deep neck flexor nods, 5 second holds, 1x20 - seated suboccipital stretch with self overpressure, 1x10 with 5 second hold and additional practice time - seated cervical retraction, 1x10 (good form).  - Education on HEP including handout    Manual therapy: to reduce pain and tissue tension, improve range of motion, neuromodulation, in order to promote improved ability to complete functional activities. SUPINE - STM to the posterior cervical spine musculature focusing on the suboccipital and upper trap region - manual traction 3x30 seconds (feels "wonderful")   HOME EXERCISE PROGRAM Access Code: [email or web address] URL: [email or web address] Date: 01/26/2021 Prepared by: Norton Blizzard  Exercises Supine Deep Neck Flexor Nods - 2 x daily - 20 reps - 5 seconds hold Sub-Occipital Cervical Stretch - 2 x daily - 1 sets - 20 reps - 5 seconds hold Seated Cervical Retraction - 2 x daily - 1 sets - 20 reps - 1-5 seconds hold   PT Education - 01/26/21 1811     Education Details Exercise purpose/form. Education on HEP including handout    Person(s) Educated Patient    Methods Explanation;Demonstration;Tactile cues;Verbal cues    Comprehension Verbalized understanding;Returned demonstration;Verbal cues required;Tactile cues required;Need further instruction              PT Short Term Goals - 01/18/21 1212       PT SHORT TERM GOAL #1   Title Be independent with initial home exercise program for self-management of symptoms.    Baseline initial HEP to be provided at visit 2 as appropriate (01/17/2021);    Time 2    Period Weeks    Status New    Target Date 02/01/21               PT Long Term Goals - 01/18/21 1212       PT LONG TERM GOAL #1   Title Be independent with a long-term home exercise program for self-management of symptoms.    Baseline initial HEP provided at visit 2 as appropriate (01/17/2021);    Time 12    Period Weeks    Status New   TARGET DATE FOR ALL LONG TERM GOALS: 04/11/2021     PT LONG TERM GOAL #2   Title Demonstrate improved FOTO to equal or greater than 60 by visit #10 to demonstrate improvement in overall condition and self-reported functional ability.    Baseline 45 (01/17/2021);     Time 12    Period Weeks    Status New      PT LONG TERM GOAL #3   Title Patient will demonstrate cervical spine AROM rotation to equal or greater than 60 degrees each way to improve ability to check blind spot while driving and view surroundings.    Baseline Rotation:   R 50  L 28 (01/17/2021);    Time 12    Period Weeks    Status New      PT LONG TERM GOAL #4   Title Pateint will score equal or greater than 25/30 on the Functional Gait Assessment to demonstrate low fall risk    Baseline to be  measured visit 2 as appropriate (01/17/2021);    Time 12    Period Weeks    Status New      PT LONG TERM GOAL #5   Title Complete community, work and/or recreational activities without limitation due to current condition.    Baseline restricts reading and computer work, social activities requiring computer, moving head quickly, turning to look behind her , bending over to clean a bathtub, looking up to see a bird, changing a light bulb overhead (01/17/2021);    Time 12    Period Weeks    Status New                   Plan - 01/26/21 1809     Clinical Impression Statement Patient tolerated treatment well overall and reported some relief from manual therapy. Established initial HEP that includes neck strengthening. Patient would benefit from continued management of limiting condition by skilled physical therapist to address remaining impairments and functional limitations to work towards stated goals and return to PLOF or maximal functional independence.    Personal Factors and Comorbidities Age;Time since onset of injury/illness/exacerbation;Comorbidity 3+;Past/Current Experience    Comorbidities Relevant past medical history and comorbidities include peripheral neuropathy, migraine aura including a few min of aphasia following (possible TIA), carotid stenosis (she has had conflicting advice and dx on this)    Examination-Activity Limitations Sit    Examination-Participation  Restrictions Cleaning;Driving;Interpersonal Relationship   restricts reading and computer work, social activities requiring computer, moving head quickly, turning to look behind her , bending over to clean a bathtub, looking up to see a bird, changing a light bulb overhead.   Stability/Clinical Decision Making Stable/Uncomplicated    Rehab Potential Good    PT Frequency 2x / week    PT Duration 12 weeks    PT Treatment/Interventions ADLs/Self Care Home Management;Cryotherapy;Moist Heat;Canalith Repostioning;Therapeutic activities;Therapeutic exercise;Balance training;Neuromuscular re-education;Manual techniques;Dry needling;Passive range of motion;Patient/family education    PT Next Visit Plan update HEP as appropriate, ROM, stretching, postural strengthening, consider vestibular exam if receive order from MD    PT Home Exercise Plan Medbridge Access Code: [email or web address]    Consulted and Agree with Plan of Care Patient             Patient will benefit from skilled therapeutic intervention in order to improve the following deficits and impairments:  Dizziness, Pain, Decreased mobility, Increased muscle spasms, Postural dysfunction, Decreased activity tolerance, Decreased endurance, Decreased range of motion, Decreased strength, Hypomobility, Impaired perceived functional ability, Decreased balance  Visit Diagnosis: Cervicalgia  Other muscle spasm  Dizziness and giddiness     Problem List Patient Active Problem List   Diagnosis Date Noted   History of rib fracture 01/07/2021   Cervical radiculopathy 01/07/2021   Low blood pressure reading 12/18/2020   COVID-19 virus infection 09/14/2020   Gait disorder 06/04/2020   Aortic atherosclerosis (HCC) 05/02/2020   Altered bowel function 02/03/2020   Dizziness 12/31/2018   Degenerative joint disease (DJD) of hip 11/29/2018   Bursitis of left hip 05/31/2018   Encounter for preventive health examination 07/31/2016   Lumbar radiculitis  12/03/2014   DDD (degenerative disc disease), lumbar 12/03/2014   Intracranial arachnoid cyst 10/25/2014   Migraine aura without headache 04/24/2014   Encounter for Medicare annual wellness exam 11/01/2013   S/P hysterectomy with oophorectomy 01/03/2013   HTN (hypertension) 10/19/2012   B12 deficiency 04/16/2012   Anxiety state 04/16/2012   Generalized anxiety disorder 04/16/2012   Neuropathy of both feet 09/18/2011  Hyperlipidemia LDL goal <70 03/21/2011   Insomnia due to anxiety and fear 03/21/2011   Osteoporosis     Luretha Murphy. Ilsa Iha, PT, DPT 01/26/21, 6:12 PM  [facility] PHYSICAL AND SPORTS MEDICINE [address] [place], [address] Phone: [phone]   Fax:  [phone]  Name: Laura [name] MRN: [id number] Date of Birth: [birth year]

## 2021-01-27 NOTE — Telephone Encounter (Signed)
Spoke with pt and scheduled her for an appt with Dr. Darrick Huntsman on Wednesday. Pt is aware off appt date and time.

## 2021-01-31 ENCOUNTER — Ambulatory Visit: Payer: Medicare PPO | Attending: Internal Medicine | Admitting: Physical Therapy

## 2021-01-31 ENCOUNTER — Encounter: Payer: Self-pay | Admitting: Physical Therapy

## 2021-01-31 DIAGNOSIS — M542 Cervicalgia: Secondary | ICD-10-CM | POA: Diagnosis not present

## 2021-01-31 DIAGNOSIS — R42 Dizziness and giddiness: Secondary | ICD-10-CM | POA: Insufficient documentation

## 2021-01-31 DIAGNOSIS — M62838 Other muscle spasm: Secondary | ICD-10-CM | POA: Diagnosis not present

## 2021-01-31 NOTE — Therapy (Signed)
[facility] [address], [place], [address] Phone: [phone]   Fax:  [phone]  Physical Therapy Treatment  Patient Details  Name: Laura [name] MRN: [id number] Date of Birth: 06-05-1932 Referring Provider (PT): Crecencio Mc, MD   Encounter Date: 01/31/2021   PT End of Session - 01/31/21 1307     Visit Number 3    Number of Visits 24    Date for PT Re-Evaluation 04/11/21    Authorization Type [employer] MEDICARE reporting period from 01/17/2021    Authorization Time Period Auth 24 PT visits  01/26/21 - 04/28/21  Authorization [id number]    Authorization - Visit Number 2    Authorization - Number of Visits 24    Progress Note Due on Visit 10    PT Start Time 1303    PT Stop Time 1341    PT Time Calculation (min) 38 min    Activity Tolerance Patient tolerated treatment well    Behavior During Therapy [facility] for tasks assessed/performed             Past Medical History:  Diagnosis Date   Accidental fall 10/16/2013   Anxiety    Carotid stenosis    Left    Fracture of rib 09/26/2019   High cholesterol    Hypertension    Pt states that her blood pressure is very well controlled   Migraine with aura    Neuropathy    Osteoporosis     Past Surgical History:  Procedure Laterality Date   ABDOMINAL HYSTERECTOMY     secondary to peritonitis and complications    APPENDECTOMY     TONSILLECTOMY     TUBAL LIGATION     at age 33, vaginal complicated by peritonitis    There were no vitals filed for this visit.   Subjective Assessment - 01/31/21 1305     Subjective Patient reports she felt really good after last PT session. She reports she has 4/10 pain in the mid suboccipital region. She states she was able to to two of the exercises well but the 3rd one she would like more direction on because it seemed to be hurting her so she stopped doing it after 2 days.    Pertinent History Patient is a 88  y.o. female who presents to outpatient physical therapy with a referral for medical diagnosis chronic neck pain. This patient's chief complaints consist of neck stiffness and discomfort leading to the following functional deficits: restricts reading and computer work, social activities requiring computer, moving head quickly, turning to look behind her , bending over to clean a bathtub, looking up to see a bird, changing a light bulb overhead. .  Relevant past medical history and comorbidities include peripheral neuropathy, migraine aura including a few min of aphasia following (possible TIA), carotid stenosis (she has had conflicting advice and dx on this).  Patient denies hx of cancer, confirmed stroke, seizures, lung problem, major cardiac events, diabetes, unexplained weight loss (last year did but has been cleared), new changes in bowel or bladder problems, new onset stumbling. Has dropped things more than usual.    Limitations Other (comment);House hold activities   Functional Limitations: restricts reading and computer work, social activities requiring computer, moving head quickly, turning to look behind her , bending over to clean a bathtub, looking up to see a bird, changing a light bulb overhead.   Currently in Pain? Yes    Pain Score 4  TREATMENT:   denies latex allergy  Therapeutic exercise: to centralize symptoms and improve ROM, strength, muscular endurance, and activity tolerance required for successful completion of functional activities.  - Upper body ergometer level 4 encourage joint nutrition, warm tissue, induce analgesic effect of aerobic exercise, improve muscular strength and endurance,  and prepare for remainder of session. 4 min total changing directions each minute.  - hooklying deep neck flexor nods, 10 second holds, 1x20 - hooklying deep neck flexor nod to lift, 3x10 - seated suboccipital stretch with self overpressure, 1x10 with 5 second hold and  additional practice time - seated cervical retraction, 1x10 (fair form, limited motion).  - seated cervical retraction against YTB around back of head, 1x10, (difficulty coordinating, discontinued this session).  - Education on HEP including handout    Manual therapy: to reduce pain and tissue tension, improve range of motion, neuromodulation, in order to promote improved ability to complete functional activities. SUPINE - STM to the posterior cervical spine musculature focusing on the suboccipital and upper trap region - manual traction 3x30 seconds      HOME EXERCISE PROGRAM Access Code: [email or web address] URL: [email or web address] Date: 01/31/2021 Prepared by: Rosita Kea  Exercises Sub-Occipital Cervical Stretch - 2 x daily - 1 sets - 20 reps - 5 seconds hold Seated Cervical Retraction - 2 x daily - 1 sets - 20 reps - 1-5 seconds hold Supine Deep Neck Flexor Nods - 1 x daily - 10 reps - 10 seconds hold Supine Deep Neck Flexor Training - Repetitions - 1 x daily - 3 sets - 10 reps      PT Education - 01/31/21 1307     Education Details Exercise purpose/form. Self management techniques    Person(s) Educated Patient    Methods Explanation;Demonstration;Tactile cues;Verbal cues    Comprehension Verbalized understanding;Returned demonstration;Verbal cues required;Tactile cues required;Need further instruction              PT Short Term Goals - 01/18/21 1212       PT SHORT TERM GOAL #1   Title Be independent with initial home exercise program for self-management of symptoms.    Baseline initial HEP to be provided at visit 2 as appropriate (01/17/2021);    Time 2    Period Weeks    Status New    Target Date 02/01/21               PT Long Term Goals - 01/18/21 1212       PT LONG TERM GOAL #1   Title Be independent with a long-term home exercise program for self-management of symptoms.    Baseline initial HEP provided at visit 2 as appropriate  (01/17/2021);    Time 12    Period Weeks    Status New   TARGET DATE FOR ALL LONG TERM GOALS: 04/11/2021     PT LONG TERM GOAL #2   Title Demonstrate improved FOTO to equal or greater than 60 by visit #10 to demonstrate improvement in overall condition and self-reported functional ability.    Baseline 45 (01/17/2021);    Time 12    Period Weeks    Status New      PT LONG TERM GOAL #3   Title Patient will demonstrate cervical spine AROM rotation to equal or greater than 60 degrees each way to improve ability to check blind spot while driving and view surroundings.    Baseline Rotation:   R 50  L 28 (01/17/2021);    Time 12  Period Weeks    Status New      PT LONG TERM GOAL #4   Title Pateint will score equal or greater than 25/30 on the Functional Gait Assessment to demonstrate low fall risk    Baseline to be measured visit 2 as appropriate (01/17/2021);    Time 12    Period Weeks    Status New      PT LONG TERM GOAL #5   Title Complete community, work and/or recreational activities without limitation due to current condition.    Baseline restricts reading and computer work, social activities requiring computer, moving head quickly, turning to look behind her , bending over to clean a bathtub, looking up to see a bird, changing a light bulb overhead (01/17/2021);    Time 12    Period Weeks    Status New                   Plan - 01/31/21 1317     Clinical Impression Statement Patient tolerated treatment well overall and again reported decreased pain with manual therapy. Reviewed and updated HEP as appropriate. Session focused on improving postural strength and cervical spine mobility to decrease pain and improve function. Patient would benefit from continued management of limiting condition by skilled physical therapist to address remaining impairments and functional limitations to work towards stated goals and return to PLOF or maximal functional independence.     Personal Factors and Comorbidities Age;Time since onset of injury/illness/exacerbation;Comorbidity 3+;Past/Current Experience    Comorbidities Relevant past medical history and comorbidities include peripheral neuropathy, migraine aura including a few min of aphasia following (possible TIA), carotid stenosis (she has had conflicting advice and dx on this)    Examination-Activity Limitations Sit    Examination-Participation Restrictions Cleaning;Driving;Interpersonal Relationship   restricts reading and computer work, social activities requiring computer, moving head quickly, turning to look behind her , bending over to clean a bathtub, looking up to see a bird, changing a light bulb overhead.   Stability/Clinical Decision Making Stable/Uncomplicated    Rehab Potential Good    PT Frequency 2x / week    PT Duration 12 weeks    PT Treatment/Interventions ADLs/Self Care Home Management;Cryotherapy;Moist Heat;Canalith Repostioning;Therapeutic activities;Therapeutic exercise;Balance training;Neuromuscular re-education;Manual techniques;Dry needling;Passive range of motion;Patient/family education    PT Next Visit Plan update HEP as appropriate, ROM, stretching, postural strengthening, consider vestibular exam if receive order from MD    PT [place] Access Code: [email or web address]    Consulted and Agree with Plan of Care Patient             Patient will benefit from skilled therapeutic intervention in order to improve the following deficits and impairments:  Dizziness, Pain, Decreased mobility, Increased muscle spasms, Postural dysfunction, Decreased activity tolerance, Decreased endurance, Decreased range of motion, Decreased strength, Hypomobility, Impaired perceived functional ability, Decreased balance  Visit Diagnosis: Cervicalgia  Other muscle spasm  Dizziness and giddiness     Problem List Patient Active Problem List   Diagnosis Date Noted   History of rib fracture  01/07/2021   Cervical radiculopathy 01/07/2021   Low blood pressure reading 12/18/2020   COVID-19 virus infection 09/14/2020   Gait disorder 06/04/2020   Aortic atherosclerosis ([place]) 05/02/2020   Altered bowel function 02/03/2020   Dizziness 12/31/2018   Degenerative joint disease (DJD) of hip 11/29/2018   Bursitis of left hip 05/31/2018   Encounter for preventive health examination 07/31/2016   Lumbar radiculitis 12/03/2014   DDD (degenerative disc disease),  lumbar 12/03/2014   Intracranial arachnoid cyst 10/25/2014   Migraine aura without headache 04/24/2014   Encounter for Medicare annual wellness exam 11/01/2013   S/P hysterectomy with oophorectomy 01/03/2013   HTN (hypertension) 10/19/2012   B12 deficiency 04/16/2012   Anxiety state 04/16/2012   Generalized anxiety disorder 04/16/2012   Neuropathy of both feet 09/18/2011   Hyperlipidemia LDL goal <70 03/21/2011   Insomnia due to anxiety and fear 03/21/2011   Osteoporosis    Everlean Alstrom. Graylon Good, PT, DPT 01/31/21, 1:53 PM  [facility] [address] [place], [address] Phone: [phone]   Fax:  [phone]  Name: Laura [name] MRN: [id number] Date of Birth: 1932-10-16

## 2021-02-01 ENCOUNTER — Ambulatory Visit: Payer: Medicare PPO | Admitting: Internal Medicine

## 2021-02-01 ENCOUNTER — Encounter: Payer: Self-pay | Admitting: Internal Medicine

## 2021-02-01 ENCOUNTER — Other Ambulatory Visit: Payer: Self-pay

## 2021-02-01 DIAGNOSIS — F409 Phobic anxiety disorder, unspecified: Secondary | ICD-10-CM

## 2021-02-01 DIAGNOSIS — F5105 Insomnia due to other mental disorder: Secondary | ICD-10-CM | POA: Diagnosis not present

## 2021-02-01 MED ORDER — ZOLPIDEM TARTRATE 10 MG PO TABS: ORAL_TABLET | ORAL | 5 refills | Status: DC

## 2021-02-01 MED ORDER — AMLODIPINE BESYLATE 2.5 MG PO TABS: ORAL_TABLET | ORAL | 3 refills | Status: DC

## 2021-02-01 NOTE — Assessment & Plan Note (Signed)
I have reviewed the dangers of prescribing this drug to the elderly with patient.  She has been [place] dependent for over five years, and prior attempts to use an alternative medication  have been unsuccessful resulting in loss of sleep.  She has not slept well since reducing the dose to 5mg   And is willing to accept the risks of adverse events. Refilled today at the 10 mg dose

## 2021-02-01 NOTE — Progress Notes (Signed)
Subjective:  Patient ID: Laura [name], female    DOB: [birth year]  Age: 88 y.o. MRN: [id number]  CC: The encounter diagnosis was Insomnia due to anxiety and fear.  HPI Laura [name] presents for management of insomnia Chief Complaint  Patient presents with   Follow-up    Follow up on Zolpidem rx. Pt was taking 10 mg nightly and has been decreased to 5 mg nightly but the 5 mg is not helping her.    This visit occurred during the SARS-CoV-2 public health emergency.  Safety protocols were in place, including screening questions prior to the visit, additional usage of staff PPE, and extensive cleaning of exam room while observing appropriate contact time as indicated for disinfecting solutions.   Insomnia: her ambien dose was reduced to 5 mg nightly at last visit because of her age.  She has no histroy fo falls due to oversedation, and has taken 10 mg  dose daily for over 8 years. Since the dose reduction She has not had a full night's sleep.  Sleep latency has increased to  2 hours,  then sleeps only for a few hours.  Exhausted .    Outpatient Medications Prior to Visit  Medication Sig Dispense Refill   ALPRAZolam (XANAX) 0.5 MG tablet Take 0.5 tablets (0.25 mg total) by mouth 2 (two) times daily as needed for anxiety or sleep. 30 tablet 0   calcium carbonate (OS-CAL) 600 MG TABS Take 600 mg by mouth 2 (two) times daily with a meal.     Cholecalciferol (VITAMIN D3) 50 MCG (2000 UT) CAPS Take 1 capsule by mouth daily.     Coenzyme Q10 (CO Q 10) 100 MG CAPS Take 1 capsule by mouth daily.     Cyanocobalamin 2500 MCG SUBL PLACE 1 TABLET UNDER THE TONGUE DAILY AS DIRECTED 90 tablet 1   denosumab (PROLIA) 60 MG/ML SOLN injection Inject 60 mg into the skin every 6 (six) months. Administer in upper arm, thigh, or abdomen     escitalopram (LEXAPRO) 10 MG tablet TAKE 1 TABLET(10 MG) BY MOUTH DAILY 90 tablet 0   magnesium oxide (MAG-OX) 400 MG tablet Take 400 mg by mouth 2 (two) times daily.      Multiple Vitamins-Minerals (PRESERVISION AREDS 2 PO) Take 1 capsule by mouth daily.     nitroGLYCERIN (NITROSTAT) 0.4 MG SL tablet Place 1 tablet (0.4 mg total) under the tongue every 5 (five) minutes as needed for chest pain. 50 tablet 3   psyllium (METAMUCIL) 58.6 % packet Take 1 packet by mouth daily.     Riboflavin 100 MG CAPS Take 1 capsule by mouth 2 (two) times daily.      amLODipine (NORVASC) 2.5 MG tablet TAKE 1 TABLET(2.5 MG) BY MOUTH DAILY 90 tablet 1   zolpidem (AMBIEN) 5 MG tablet TAKE 1 TABLET(10 MG) BY MOUTH AT BEDTIME 30 tablet 5   benzonatate (TESSALON) 200 MG capsule Take 1 capsule (200 mg total) by mouth 3 (three) times daily as needed for cough. (Patient not taking: Reported on 02/01/2021) 30 capsule 0   guaiFENesin-codeine (CHERATUSSIN AC) 100-10 MG/5ML syrup Take 5 mLs by mouth 3 (three) times daily as needed for cough. (Patient not taking: Reported on 02/01/2021) 120 mL 0   Facility-Administered Medications Prior to Visit  Medication Dose Route Frequency Provider Last Rate Last Admin   denosumab (PROLIA) injection 60 mg  60 mg Subcutaneous Once Sherlene Shams, MD        Review of Systems;  Patient denies headache, fevers, malaise, unintentional weight loss, skin rash, eye pain, sinus congestion and sinus pain, sore throat, dysphagia,  hemoptysis , cough, dyspnea, wheezing, chest pain, palpitations, orthopnea, edema, abdominal pain, nausea, melena, diarrhea, constipation, flank pain, dysuria, hematuria, urinary  Frequency, nocturia, numbness, tingling, seizures,  Focal weakness, Loss of consciousness,  Tremor, insomnia, depression, anxiety, and suicidal ideation.      Objective:  BP 134/76 (BP Location: Right Arm, Patient Position: Sitting, Cuff Size: Normal)    Pulse 69    Temp 98.5 F (36.9 C) (Oral)    Ht 5\' 4"  (1.626 m)    Wt 141 lb (64 kg)    SpO2 98%    BMI 24.20 kg/m   BP Readings from Last 3 Encounters:  02/01/21 134/76  01/05/21 120/68  12/14/20 115/72    Wt  Readings from Last 3 Encounters:  02/01/21 141 lb (64 kg)  01/05/21 140 lb (63.5 kg)  12/14/20 141 lb 3.2 oz (64 kg)    General appearance: alert, cooperative and appears stated age Ears: normal TM's and external ear canals both ears Throat: lips, mucosa, and tongue normal; teeth and gums normal Neck: no adenopathy, no carotid bruit, supple, symmetrical, trachea midline and thyroid not enlarged, symmetric, no tenderness/mass/nodules Back: symmetric, no curvature. ROM normal. No CVA tenderness. Lungs: clear to auscultation bilaterally Heart: regular rate and rhythm, S1, S2 normal, no murmur, click, rub or gallop Abdomen: soft, non-tender; bowel sounds normal; no masses,  no organomegaly Pulses: 2+ and symmetric Skin: Skin color, texture, turgor normal. No rashes or lesions Lymph nodes: Cervical, supraclavicular, and axillary nodes normal.  Lab Results  Component Value Date   HGBA1C 5.6 02/10/2019   HGBA1C 5.4 05/05/2014    Lab Results  Component Value Date   CREATININE 0.65 12/14/2020   CREATININE 0.69 06/17/2020   CREATININE 0.67 06/01/2020    Lab Results  Component Value Date   WBC 6.7 12/14/2020   HGB 13.4 12/14/2020   HCT 40.9 12/14/2020   PLT 270.0 12/14/2020   GLUCOSE 92 12/14/2020   CHOL 166 01/05/2021   TRIG 88.0 01/05/2021   HDL 89.70 01/05/2021   LDLDIRECT 109.7 09/27/2011   LDLCALC 58 01/05/2021   ALT 20 12/14/2020   AST 18 12/14/2020   NA 136 12/14/2020   K 4.1 12/14/2020   CL 99 12/14/2020   CREATININE 0.65 12/14/2020   BUN 10 12/14/2020   CO2 28 12/14/2020   TSH 2.79 12/14/2020   INR 1.1 06/17/2020   HGBA1C 5.6 02/10/2019   MICROALBUR <0.7 06/01/2020    No results found.  Assessment & Plan:   Problem List Items Addressed This Visit     Insomnia due to anxiety and fear    I have reviewed the dangers of prescribing this drug to the elderly with patient.  She has been [place] dependent for over five years, and prior attempts to use an  alternative medication  have been unsuccessful resulting in loss of sleep.  She has not slept well since reducing the dose to 5mg   And is willing to accept the risks of adverse events. Refilled today at the 10 mg dose       Meds ordered this encounter  Medications   zolpidem (AMBIEN) 10 MG tablet    Sig: TAKE 1 TABLET(10 MG) BY MOUTH AT BEDTIME.  NOT DOSE INCREASE;  5 MG NOT WORKING    Dispense:  30 tablet    Refill:  5   amLODipine (NORVASC) 2.5 MG tablet  Sig: TAKE 1 TABLET(2.5 MG) BY MOUTH DAILY    Dispense:  90 tablet    Refill:  3     I provided 20 minutes of  face-to-face time during this encounter reviewing patient's current problems and past medication trials , providing counseling on the above mentioned problems , and coordination  of care .   Follow-up: No follow-ups on file.   Crecencio Mc, MD

## 2021-02-02 ENCOUNTER — Encounter: Payer: Self-pay | Admitting: Physical Therapy

## 2021-02-02 ENCOUNTER — Ambulatory Visit: Payer: Medicare PPO | Admitting: Physical Therapy

## 2021-02-02 DIAGNOSIS — M62838 Other muscle spasm: Secondary | ICD-10-CM

## 2021-02-02 DIAGNOSIS — M542 Cervicalgia: Secondary | ICD-10-CM

## 2021-02-02 DIAGNOSIS — R42 Dizziness and giddiness: Secondary | ICD-10-CM

## 2021-02-02 NOTE — Therapy (Signed)
[facility] [address] [place], [place], [address] Phone: [phone]   Fax:  [phone]  Physical Therapy Treatment  Patient Details  Name: Laura ButtsBettie J Jenny MRN: [id number] Date of Birth: 1932/08/13 Referring Provider (PT): Sherlene Shamsullo, Teresa L, MD   Encounter Date: 02/02/2021   PT End of Session - 02/02/21 1326     Visit Number 4    Number of Visits 24    Date for PT Re-Evaluation 04/11/21    Authorization Type [employer]IC[employer] reporting period from 01/17/2021    Authorization Time Period Auth 24 PT visits  01/26/21 - 04/28/21  Authorization #[id number]    Authorization - Visit Number 3    Authorization - Number of Visits 24    Progress Note Due on Visit 10    PT Start Time 1302    PT Stop Time 1340    PT Time Calculation (min) 38 min    Activity Tolerance Patient tolerated treatment well    Behavior [facility] for tasks assessed/performed             Past Medical History:  Diagnosis Date   Accidental fall 10/16/2013   Anxiety    Carotid stenosis    Left    Fracture of rib 09/26/2019   High cholesterol    Hypertension    Pt states that her blood pressure is very well controlled   Migraine with aura    Neuropathy    Osteoporosis     Past Surgical History:  Procedure Laterality Date   ABDOMINAL HYSTERECTOMY     secondary to peritonitis and complications    APPENDECTOMY     TONSILLECTOMY     TUBAL LIGATION     at age 88, vaginal complicated by peritonitis    There were no vitals filed for this visit.   Subjective Assessment - 02/02/21 1304     Subjective Patient states she is feeling well. She reports no pain upon arrival. She states she feels her neck is getting better. She feels like she can move her head better and her head feels lighter. She states her HEP is going well and she thinks she is mastering the exercises.    Pertinent History Patient is a 88 y.o. female who presents to  outpatient physical therapy with a referral for medical diagnosis chronic neck pain. This patient's chief complaints consist of neck stiffness and discomfort leading to the following functional deficits: restricts reading and computer work, social activities requiring computer, moving head quickly, turning to look behind her , bending over to clean a bathtub, looking up to see a bird, changing a light bulb overhead. .  Relevant past medical history and comorbidities include peripheral neuropathy, migraine aura including a few min of aphasia following (possible TIA), carotid stenosis (she has had conflicting advice and dx on this).  Patient denies hx of cancer, confirmed stroke, seizures, lung problem, major cardiac events, diabetes, unexplained weight loss (last year did but has been cleared), new changes in bowel or bladder problems, new onset stumbling. Has dropped things more than usual.    Limitations Other (comment);House hold activities   Functional Limitations: restricts reading and computer work, social activities requiring computer, moving head quickly, turning to look behind her , bending over to clean a bathtub, looking up to see a bird, changing a light bulb overhead.   Currently in Pain? No/denies               TREATMENT:  denies latex allergy   Therapeutic exercise: to centralize symptoms and improve ROM, strength, muscular endurance, and activity tolerance required for successful completion of functional activities.  - Upper body ergometer level 4 encourage joint nutrition, warm tissue, induce analgesic effect of aerobic exercise, improve muscular strength and endurance,  and prepare for remainder of session. 4 min total changing directions each minute.  - hooklying deep neck flexor nods, 10 second holds, 1x10 - hooklying deep neck flexor nod to lift, 3x10 - seated suboccipital stretch with self overpressure, 1x10 with 5 second hold and additional practice time - standing cervical  retraction with back against wall with towel roll, 1x20 (limited motion).  - Standing cervical thoracic extension/BUE flexion and serratus anterior activation, lat stretch, with foam roller up wall, 1x20 - standing lower trap Y with yellow theraband anchored at waist, 3x10  Manual therapy: to reduce pain and tissue tension, improve range of motion, neuromodulation, in order to promote improved ability to complete functional activities. SUPINE - STM to the posterior cervical spine musculature focusing on the suboccipital and upper trap region - manual traction 4x30 seconds      HOME EXERCISE PROGRAM Access Code: [email or web address] URL: [email or web address] Date: 01/31/2021 Prepared by: Norton Blizzard   Exercises Sub-Occipital Cervical Stretch - 2 x daily - 1 sets - 20 reps - 5 seconds hold Seated Cervical Retraction - 2 x daily - 1 sets - 20 reps - 1-5 seconds hold Supine Deep Neck Flexor Nods - 1 x daily - 10 reps - 10 seconds hold Supine Deep Neck Flexor Training - Repetitions - 1 x daily - 3 sets - 10 reps    PT Education - 02/02/21 1326     Education Details Exercise purpose/form. Self management techniques    Person(s) Educated Patient    Methods Explanation;Demonstration;Tactile cues;Verbal cues    Comprehension Returned demonstration;Verbal cues required;Tactile cues required;Verbalized understanding;Need further instruction              PT Short Term Goals - 01/18/21 1212       PT SHORT TERM GOAL #1   Title Be independent with initial home exercise program for self-management of symptoms.    Baseline initial HEP to be provided at visit 2 as appropriate (01/17/2021);    Time 2    Period Weeks    Status New    Target Date 02/01/21               PT Long Term Goals - 01/18/21 1212       PT LONG TERM GOAL #1   Title Be independent with a long-term home exercise program for self-management of symptoms.    Baseline initial HEP provided at visit 2 as  appropriate (01/17/2021);    Time 12    Period Weeks    Status New   TARGET DATE FOR ALL LONG TERM GOALS: 04/11/2021     PT LONG TERM GOAL #2   Title Demonstrate improved FOTO to equal or greater than 60 by visit #10 to demonstrate improvement in overall condition and self-reported functional ability.    Baseline 45 (01/17/2021);    Time 12    Period Weeks    Status New      PT LONG TERM GOAL #3   Title Patient will demonstrate cervical spine AROM rotation to equal or greater than 60 degrees each way to improve ability to check blind spot while driving and view surroundings.    Baseline Rotation:   R 50  L 28 (  01/17/2021);    Time 12    Period Weeks    Status New      PT LONG TERM GOAL #4   Title Pateint will score equal or greater than 25/30 on the Functional Gait Assessment to demonstrate low fall risk    Baseline to be measured visit 2 as appropriate (01/17/2021);    Time 12    Period Weeks    Status New      PT LONG TERM GOAL #5   Title Complete community, work and/or recreational activities without limitation due to current condition.    Baseline restricts reading and computer work, social activities requiring computer, moving head quickly, turning to look behind her , bending over to clean a bathtub, looking up to see a bird, changing a light bulb overhead (01/17/2021);    Time 12    Period Weeks    Status New                   Plan - 02/02/21 1329     Clinical Impression Statement Patient tolerated treatment well and continues to respond well to manual therapy and exercise. Patient demonstrated good carry over in exercise form since last session and needed only mild cuing for exercises learned prior. She would benefit from further progression of postural and mobility exercise. Patient would benefit from continued management of limiting condition by skilled physical therapist to address remaining impairments and functional limitations to work towards stated goals  and return to PLOF or maximal functional independence.    Personal Factors and Comorbidities Age;Time since onset of injury/illness/exacerbation;Comorbidity 3+;Past/Current Experience    Comorbidities Relevant past medical history and comorbidities include peripheral neuropathy, migraine aura including a few min of aphasia following (possible TIA), carotid stenosis (she has had conflicting advice and dx on this)    Examination-Activity Limitations Sit    Examination-Participation Restrictions Cleaning;Driving;Interpersonal Relationship   restricts reading and computer work, social activities requiring computer, moving head quickly, turning to look behind her , bending over to clean a bathtub, looking up to see a bird, changing a light bulb overhead.   Stability/Clinical Decision Making Stable/Uncomplicated    Rehab Potential Good    PT Frequency 2x / week    PT Duration 12 weeks    PT Treatment/Interventions ADLs/Self Care Home Management;Cryotherapy;Moist Heat;Canalith Repostioning;Therapeutic activities;Therapeutic exercise;Balance training;Neuromuscular re-education;Manual techniques;Dry needling;Passive range of motion;Patient/family education    PT Next Visit Plan update HEP as appropriate, ROM, stretching, postural strengthening, consider vestibular exam if receive order from MD    PT Home Exercise Plan Medbridge Access Code: [email or web address]    Consulted and Agree with Plan of Care Patient             Patient will benefit from skilled therapeutic intervention in order to improve the following deficits and impairments:  Dizziness, Pain, Decreased mobility, Increased muscle spasms, Postural dysfunction, Decreased activity tolerance, Decreased endurance, Decreased range of motion, Decreased strength, Hypomobility, Impaired perceived functional ability, Decreased balance  Visit Diagnosis: Cervicalgia  Other muscle spasm  Dizziness and giddiness     Problem List Patient Active Problem List    Diagnosis Date Noted   History of rib fracture 01/07/2021   Cervical radiculopathy 01/07/2021   Low blood pressure reading 12/18/2020   COVID-19 virus infection 09/14/2020   Gait disorder 06/04/2020   Aortic atherosclerosis (HCC) 05/02/2020   Altered bowel function 02/03/2020   Dizziness 12/31/2018   Degenerative joint disease (DJD) of hip 11/29/2018   Bursitis of left hip 05/31/2018  Encounter for preventive health examination 07/31/2016   Lumbar radiculitis 12/03/2014   DDD (degenerative disc disease), lumbar 12/03/2014   Intracranial arachnoid cyst 10/25/2014   Migraine aura without headache 04/24/2014   Encounter for Medicare annual wellness exam 11/01/2013   S/P hysterectomy with oophorectomy 01/03/2013   HTN (hypertension) 10/19/2012   B12 deficiency 04/16/2012   Anxiety state 04/16/2012   Generalized anxiety disorder 04/16/2012   Neuropathy of both feet 09/18/2011   Hyperlipidemia LDL goal <70 03/21/2011   Insomnia due to anxiety and fear 03/21/2011   Osteoporosis     Luretha MurphySara R. Ilsa IhaSnyder, PT, DPT 02/02/21, 1:43 PM  [facility] [address] [place], [place], [address] Phone: [phone]   Fax:  [phone]  Name: Laura ButtsBettie J Logiudice MRN: [id number] Date of Birth: [birth year]

## 2021-02-06 ENCOUNTER — Encounter: Payer: Self-pay | Admitting: Physical Therapy

## 2021-02-06 ENCOUNTER — Ambulatory Visit: Payer: Medicare PPO | Admitting: Physical Therapy

## 2021-02-06 DIAGNOSIS — M62838 Other muscle spasm: Secondary | ICD-10-CM | POA: Diagnosis not present

## 2021-02-06 DIAGNOSIS — R42 Dizziness and giddiness: Secondary | ICD-10-CM

## 2021-02-06 DIAGNOSIS — M542 Cervicalgia: Secondary | ICD-10-CM | POA: Diagnosis not present

## 2021-02-06 NOTE — Therapy (Signed)
[facility] [address] [place], [address] Phone: [phone]   Fax:  [phone]  Physical Therapy Treatment  Patient Details  Name: Laura [name] MRN: [id number] Date of Birth: 12-27-1932 Referring Provider (PT): Crecencio Mc, MD   Encounter Date: 02/06/2021   PT End of Session - 02/06/21 1308     Visit Number 5    Number of Visits 24    Date for PT Re-Evaluation 04/11/21    Authorization Type [employer]IC[employer] reporting period from 01/17/2021    Authorization Time Period Auth 24 PT visits  01/26/21 - 04/28/21  Authorization #[id number]    Authorization - Visit Number 4    Authorization - Number of Visits 24    Progress Note Due on Visit 10    PT Start Time 1304    PT Stop Time 1342    PT Time Calculation (min) 38 min    Activity Tolerance Patient tolerated treatment well    Behavior During Therapy [facility] for tasks assessed/performed             Past Medical History:  Diagnosis Date   Accidental fall 10/16/2013   Anxiety    Carotid stenosis    Left    Fracture of rib 09/26/2019   High cholesterol    Hypertension    Pt states that her blood pressure is very well controlled   Migraine with aura    Neuropathy    Osteoporosis     Past Surgical History:  Procedure Laterality Date   ABDOMINAL HYSTERECTOMY     secondary to peritonitis and complications    APPENDECTOMY     TONSILLECTOMY     TUBAL LIGATION     at age 66, vaginal complicated by peritonitis    There were no vitals filed for this visit.   Subjective Assessment - 02/06/21 1305     Subjective Patient reports she is feeling well today. She states her neck has been feeling well. She has no pain upon arrival.    Pertinent History Patient is a 88 y.o. female who presents to outpatient physical therapy with a referral for medical diagnosis chronic neck pain. This patient's chief complaints consist of neck stiffness and discomfort  leading to the following functional deficits: restricts reading and computer work, social activities requiring computer, moving head quickly, turning to look behind her , bending over to clean a bathtub, looking up to see a bird, changing a light bulb overhead. .  Relevant past medical history and comorbidities include peripheral neuropathy, migraine aura including a few min of aphasia following (possible TIA), carotid stenosis (she has had conflicting advice and dx on this).  Patient denies hx of cancer, confirmed stroke, seizures, lung problem, major cardiac events, diabetes, unexplained weight loss (last year did but has been cleared), new changes in bowel or bladder problems, new onset stumbling. Has dropped things more than usual.    Limitations Other (comment);House hold activities   Functional Limitations: restricts reading and computer work, social activities requiring computer, moving head quickly, turning to look behind her , bending over to clean a bathtub, looking up to see a bird, changing a light bulb overhead.   Currently in Pain? No/denies            OBJECTIVE  SELF-REPORTED FUNCTION FOTO score: 61/100 (neck questionnaire)   TREATMENT:   denies latex allergy   Therapeutic exercise: to centralize symptoms and improve ROM, strength, muscular endurance, and activity tolerance  required for successful completion of functional activities.  - Upper body ergometer level 5 encourage joint nutrition, warm tissue, induce analgesic effect of aerobic exercise, improve muscular strength and endurance,  and prepare for remainder of session. 4 min total changing directions each minute.  - hooklying deep neck flexor nods, 10 second holds, 1x10 - hooklying deep neck flexor nod to lift, 3x10 - seated suboccipital stretch with self overpressure, 1x10 with 10 second hold and additional practice time - standing cervical retraction with back against wall with towel roll, 1x20, 5 second hold (limited  motion).  - standing lower trap Y with yellow theraband anchored at waist, 3x10   Manual therapy: to reduce pain and tissue tension, improve range of motion, neuromodulation, in order to promote improved ability to complete functional activities. SUPINE - STM to the posterior cervical spine musculature focusing on the suboccipital and upper trap region - manual traction 4x30 seconds      HOME EXERCISE PROGRAM Access Code: [email or web address] URL: [email or web address] Date: 01/31/2021 Prepared by: Rosita Kea   Exercises Sub-Occipital Cervical Stretch - 2 x daily - 1 sets - 20 reps - 5 seconds hold Seated Cervical Retraction - 2 x daily - 1 sets - 20 reps - 1-5 seconds hold Supine Deep Neck Flexor Nods - 1 x daily - 10 reps - 10 seconds hold Supine Deep Neck Flexor Training - Repetitions - 1 x daily - 3 sets - 10 reps    PT Education - 02/06/21 1307     Education Details Exercise purpose/form. Self management techniques    Person(s) Educated Patient    Methods Explanation;Demonstration;Tactile cues;Verbal cues    Comprehension Verbalized understanding;Returned demonstration;Verbal cues required;Tactile cues required;Need further instruction              PT Short Term Goals - 01/18/21 1212       PT SHORT TERM GOAL #1   Title Be independent with initial home exercise program for self-management of symptoms.    Baseline initial HEP to be provided at visit 2 as appropriate (01/17/2021);    Time 2    Period Weeks    Status New    Target Date 02/01/21               PT Long Term Goals - 02/06/21 1330       PT LONG TERM GOAL #1   Title Be independent with a long-term home exercise program for self-management of symptoms.    Baseline initial HEP provided at visit 2 as appropriate (01/17/2021);    Time 12    Period Weeks    Status Partially Met   TARGET DATE FOR ALL LONG TERM GOALS: 04/11/2021     PT LONG TERM GOAL #2   Title Demonstrate improved FOTO to equal  or greater than 60 by visit #10 to demonstrate improvement in overall condition and self-reported functional ability.    Baseline 45 (01/17/2021); 61 at visit #5 (02/06/2021);    Time 12    Period Weeks    Status Achieved      PT LONG TERM GOAL #3   Title Patient will demonstrate cervical spine AROM rotation to equal or greater than 60 degrees each way to improve ability to check blind spot while driving and view surroundings.    Baseline Rotation:   R 50  L 28 (01/17/2021);    Time 12    Period Weeks    Status On-going      PT LONG TERM GOAL #4  Title Pateint will score equal or greater than 25/30 on the Functional Gait Assessment to demonstrate low fall risk    Baseline to be measured visit 2 as appropriate (01/17/2021);    Time 12    Period Weeks    Status On-going      PT LONG TERM GOAL #5   Title Complete community, work and/or recreational activities without limitation due to current condition.    Baseline restricts reading and computer work, social activities requiring computer, moving head quickly, turning to look behind her, bending over to clean a bathtub, looking up to see a bird, changing a light bulb overhead (01/17/2021); patient reports improvements in most preivous limitations, but still has difficulty with prolonged computer work, Occupational hygienist a light bulb  (02/06/2021);    Time 12    Period Weeks    Status Partially Met                   Plan - 02/06/21 1329     Clinical Impression Statement Patient tolerated treatment well and FOTO score improvement demonstrate significant improvement in self- reported function, meeting today's goal. Patient appears to have experienced good improvement in symptoms and quality of life. Continues to have some pain to palpation at right suboccipital region. Would benefit from trial of less frequent visit to test stability of improvement. Patient would benefit from continued management of limiting condition by skilled physical  therapist to address remaining impairments and functional limitations to work towards stated goals and return to PLOF or maximal functional independence.    Personal Factors and Comorbidities Age;Time since onset of injury/illness/exacerbation;Comorbidity 3+;Past/Current Experience    Comorbidities Relevant past medical history and comorbidities include peripheral neuropathy, migraine aura including a few min of aphasia following (possible TIA), carotid stenosis (she has had conflicting advice and dx on this)    Examination-Activity Limitations Sit    Examination-Participation Restrictions Cleaning;Driving;Interpersonal Relationship   restricts reading and computer work, social activities requiring computer, moving head quickly, turning to look behind her , bending over to clean a bathtub, looking up to see a bird, changing a light bulb overhead.   Stability/Clinical Decision Making Stable/Uncomplicated    Rehab Potential Good    PT Frequency 2x / week    PT Duration 12 weeks    PT Treatment/Interventions ADLs/Self Care Home Management;Cryotherapy;Moist Heat;Canalith Repostioning;Therapeutic activities;Therapeutic exercise;Balance training;Neuromuscular re-education;Manual techniques;Dry needling;Passive range of motion;Patient/family education    PT Next Visit Plan update HEP as appropriate, ROM, stretching, postural strengthening, consider vestibular exam if receive order from MD    PT [place] Access Code: [email or web address]    Consulted and Agree with Plan of Care Patient             Patient will benefit from skilled therapeutic intervention in order to improve the following deficits and impairments:  Dizziness, Pain, Decreased mobility, Increased muscle spasms, Postural dysfunction, Decreased activity tolerance, Decreased endurance, Decreased range of motion, Decreased strength, Hypomobility, Impaired perceived functional ability, Decreased balance  Visit  Diagnosis: Cervicalgia  Other muscle spasm  Dizziness and giddiness     Problem List Patient Active Problem List   Diagnosis Date Noted   History of rib fracture 01/07/2021   Cervical radiculopathy 01/07/2021   Low blood pressure reading 12/18/2020   COVID-19 virus infection 09/14/2020   Gait disorder 06/04/2020   Aortic atherosclerosis ([place]) 05/02/2020   Altered bowel function 02/03/2020   Dizziness 12/31/2018   Degenerative joint disease (DJD) of hip 11/29/2018   Bursitis of  left hip 05/31/2018   Encounter for preventive health examination 07/31/2016   Lumbar radiculitis 12/03/2014   DDD (degenerative disc disease), lumbar 12/03/2014   Intracranial arachnoid cyst 10/25/2014   Migraine aura without headache 04/24/2014   Encounter for Medicare annual wellness exam 11/01/2013   S/P hysterectomy with oophorectomy 01/03/2013   HTN (hypertension) 10/19/2012   B12 deficiency 04/16/2012   Anxiety state 04/16/2012   Generalized anxiety disorder 04/16/2012   Neuropathy of both feet 09/18/2011   Hyperlipidemia LDL goal <70 03/21/2011   Insomnia due to anxiety and fear 03/21/2011   Osteoporosis     Everlean Alstrom. Graylon Good, PT, DPT 02/06/21, 1:44 PM  [facility] [address], [place], [address] Phone: [phone]   Fax:  [phone]  Name: Laura [name] MRN: [id number] Date of Birth: 1932-03-08

## 2021-02-08 ENCOUNTER — Encounter: Payer: Medicare PPO | Admitting: Physical Therapy

## 2021-02-13 ENCOUNTER — Encounter: Payer: Medicare PPO | Admitting: Physical Therapy

## 2021-02-15 ENCOUNTER — Encounter: Payer: Medicare PPO | Admitting: Physical Therapy

## 2021-02-20 ENCOUNTER — Ambulatory Visit: Payer: Medicare PPO | Admitting: Physical Therapy

## 2021-02-20 ENCOUNTER — Other Ambulatory Visit: Payer: Self-pay

## 2021-02-20 ENCOUNTER — Encounter: Payer: Self-pay | Admitting: Physical Therapy

## 2021-02-20 DIAGNOSIS — M542 Cervicalgia: Secondary | ICD-10-CM

## 2021-02-20 DIAGNOSIS — M62838 Other muscle spasm: Secondary | ICD-10-CM

## 2021-02-20 DIAGNOSIS — R42 Dizziness and giddiness: Secondary | ICD-10-CM

## 2021-02-20 NOTE — Therapy (Signed)
[facility] [address] [place], [address] Phone: [phone]   Fax:  [phone]  Physical Therapy Treatment / Discharge Summary Dates of reporting from 01/17/2021 to 02/20/2021  Patient Details  Name: Laura [name] MRN: [id number] Date of Birth: [birth year] Referring Provider (PT): Crecencio Mc, MD   Encounter Date: 02/20/2021   PT End of Session - 02/20/21 1743     Visit Number 6    Number of Visits 24    Date for PT Re-Evaluation 04/11/21    Authorization Type [employer] reporting period from 01/17/2021    Authorization Time Period Auth 24 PT visits  01/26/21 - 04/28/21  Authorization #[id number]    Authorization - Visit Number 5    Authorization - Number of Visits 24    Progress Note Due on Visit 10    PT Start Time 1300    PT Stop Time 1340    PT Time Calculation (min) 40 min    Activity Tolerance Patient tolerated treatment well    Behavior During Therapy [facility] for tasks assessed/performed             Past Medical History:  Diagnosis Date   Accidental fall 10/16/2013   Anxiety    Carotid stenosis    Left    Fracture of rib 09/26/2019   High cholesterol    Hypertension    Pt states that her blood pressure is very well controlled   Migraine with aura    Neuropathy    Osteoporosis     Past Surgical History:  Procedure Laterality Date   ABDOMINAL HYSTERECTOMY     secondary to peritonitis and complications    APPENDECTOMY     TONSILLECTOMY     TUBAL LIGATION     at age 47, vaginal complicated by peritonitis    There were no vitals filed for this visit.   Subjective Assessment - 02/20/21 1302     Subjective Patient reports she is doing well and her neck is no longer bother her. She reports she has been doing her HEP. She has been continues to be doing her yoga and she has been doing some strength training but doesn't like it because it is borning for her.    Pertinent  History Patient is a 88 y.o. female who presents to outpatient physical therapy with a referral for medical diagnosis chronic neck pain. This patient's chief complaints consist of neck stiffness and discomfort leading to the following functional deficits: restricts reading and computer work, social activities requiring computer, moving head quickly, turning to look behind her , bending over to clean a bathtub, looking up to see a bird, changing a light bulb overhead. .  Relevant past medical history and comorbidities include peripheral neuropathy, migraine aura including a few min of aphasia following (possible TIA), carotid stenosis (she has had conflicting advice and dx on this).  Patient denies hx of cancer, confirmed stroke, seizures, lung problem, major cardiac events, diabetes, unexplained weight loss (last year did but has been cleared), new changes in bowel or bladder problems, new onset stumbling. Has dropped things more than usual.    Limitations Other (comment);House hold activities   Functional Limitations: restricts reading and computer work, social activities requiring computer, moving head quickly, turning to look behind her , bending over to clean a bathtub, looking up to see a bird, changing a light bulb overhead.   Currently in Pain? No/denies    Effect of Pain  on Daily Activities Functional Limitations: no longer feels restricted by neck.                OBJECTIVE   SELF-REPORTED FUNCTION FOTO score: 61/100 (neck questionnaire, 02/06/2021)   SPINE MOTION Cervical Spine AROM Rotation:  R 65 L 43   TREATMENT:   denies latex allergy   Therapeutic exercise: to centralize symptoms and improve ROM, strength, muscular endurance, and activity tolerance required for successful completion of functional activities.  - Upper body ergometer level 5 encourage joint nutrition, warm tissue, induce analgesic effect of aerobic exercise, improve muscular strength and endurance,  and prepare  for remainder of session. 5 min total changing directions every so often.  - hooklying deep neck flexor nods, 10 second holds, 2x10 - hooklying deep neck flexor nod to lift, 3x10 - seated suboccipital stretch with self overpressure, 1x10 with 10 second hold and additional practice time - standing cervical retraction with back against wall with towel roll, 1x20, 5 second hold (limited motion).    Manual therapy: to reduce pain and tissue tension, improve range of motion, neuromodulation, in order to promote improved ability to complete functional activities. SUPINE - STM to the posterior cervical spine musculature focusing on the suboccipital and upper trap region   HOME EXERCISE PROGRAM Access Code: [email or web address] URL: [email or web address] Date: 01/31/2021 Prepared by: Rosita Kea   Exercises Sub-Occipital Cervical Stretch - 2 x daily - 1 sets - 20 reps - 5 seconds hold Seated Cervical Retraction - 2 x daily - 1 sets - 20 reps - 1-5 seconds hold Supine Deep Neck Flexor Nods - 1 x daily - 10 reps - 10 seconds hold Supine Deep Neck Flexor Training - Repetitions - 1 x daily - 3 sets - 10 reps      PT Education - 02/20/21 1742     Education provided --    Education Details Exercise purpose/form. Self management techniques    Person(s) Educated Patient    Methods Explanation;Demonstration;Tactile cues;Verbal cues    Comprehension Verbalized understanding;Returned demonstration;Tactile cues required;Verbal cues required;Need further instruction              PT Short Term Goals - 02/20/21 1336       PT SHORT TERM GOAL #1   Title Be independent with initial home exercise program for self-management of symptoms.    Baseline initial HEP to be provided at visit 2 as appropriate (01/17/2021);    Time 2    Period Weeks    Status Achieved    Target Date 02/01/21               PT Long Term Goals - 02/20/21 1336       PT LONG TERM GOAL #1   Title Be independent  with a long-term home exercise program for self-management of symptoms.    Baseline initial HEP provided at visit 2 as appropriate (01/17/2021);    Time 12    Period Weeks    Status Achieved   TARGET DATE FOR ALL LONG TERM GOALS: 04/11/2021     PT LONG TERM GOAL #2   Title Demonstrate improved FOTO to equal or greater than 60 by visit #10 to demonstrate improvement in overall condition and self-reported functional ability.    Baseline 45 (01/17/2021); 61 at visit #5 (02/06/2021);    Time 12    Period Weeks    Status Achieved      PT LONG TERM GOAL #3   Title Patient will demonstrate cervical spine  AROM rotation to equal or greater than 60 degrees each way to improve ability to check blind spot while driving and view surroundings.    Baseline Rotation:   R 50  L 28 (01/17/2021); Rotation:   R 65  L 43 (02/20/2021);    Time 12    Period Weeks    Status Partially Met      PT LONG TERM GOAL #4   Title Pateint will score equal or greater than 25/30 on the Functional Gait Assessment to demonstrate low fall risk    Baseline to be measured visit 2 as appropriate (01/17/2021); never measured since vertigo resolved (02/20/2021);    Time 12    Period Weeks    Status Unable to assess      PT LONG TERM GOAL #5   Title Complete community, work and/or recreational activities without limitation due to current condition.    Baseline restricts reading and computer work, social activities requiring computer, moving head quickly, turning to look behind her, bending over to clean a bathtub, looking up to see a bird, changing a light bulb overhead (01/17/2021); patient reports improvements in most preivous limitations, but still has difficulty with prolonged computer work, Occupational hygienist a light bulb  (02/06/2021); neck no longer limits her (02/20/2021);    Time 12    Period Weeks    Status Achieved                   Plan - 02/20/21 1750     Clinical Impression Statement Patient attended 6 physical  therapy sessions since starting this episode of care 01/17/2021. Patient has made great progress and her neck no longer limits her or bothers her. She has demonstrated excellent participation in long term HEP and is now being discharged from PT due to improvement in condition.    Personal Factors and Comorbidities Age;Time since onset of injury/illness/exacerbation;Comorbidity 3+;Past/Current Experience    Comorbidities Relevant past medical history and comorbidities include peripheral neuropathy, migraine aura including a few min of aphasia following (possible TIA), carotid stenosis (she has had conflicting advice and dx on this)    Examination-Activity Limitations Sit    Examination-Participation Restrictions Cleaning;Driving;Interpersonal Relationship   restricts reading and computer work, social activities requiring computer, moving head quickly, turning to look behind her , bending over to clean a bathtub, looking up to see a bird, changing a light bulb overhead.   Stability/Clinical Decision Making Stable/Uncomplicated    Rehab Potential Good    PT Frequency 2x / week    PT Duration 12 weeks    PT Treatment/Interventions ADLs/Self Care Home Management;Cryotherapy;Moist Heat;Canalith Repostioning;Therapeutic activities;Therapeutic exercise;Balance training;Neuromuscular re-education;Manual techniques;Dry needling;Passive range of motion;Patient/family education    PT Next Visit Plan discharged from PT due to improvement in condition.    PT Home Exercise Plan Medbridge Access Code: [email or web address]    Consulted and Agree with Plan of Care Patient             Patient will benefit from skilled therapeutic intervention in order to improve the following deficits and impairments:  Dizziness, Pain, Decreased mobility, Increased muscle spasms, Postural dysfunction, Decreased activity tolerance, Decreased endurance, Decreased range of motion, Decreased strength, Hypomobility, Impaired perceived functional  ability, Decreased balance  Visit Diagnosis: Cervicalgia  Other muscle spasm  Dizziness and giddiness     Problem List Patient Active Problem List   Diagnosis Date Noted   History of rib fracture 01/07/2021   Cervical radiculopathy 01/07/2021   Low blood pressure reading 12/18/2020  COVID-19 virus infection 09/14/2020   Gait disorder 06/04/2020   Aortic atherosclerosis ([place]) 05/02/2020   Altered bowel function 02/03/2020   Dizziness 12/31/2018   Degenerative joint disease (DJD) of hip 11/29/2018   Bursitis of left hip 05/31/2018   Encounter for preventive health examination 07/31/2016   Lumbar radiculitis 12/03/2014   DDD (degenerative disc disease), lumbar 12/03/2014   Intracranial arachnoid cyst 10/25/2014   Migraine aura without headache 04/24/2014   Encounter for Medicare annual wellness exam 11/01/2013   S/P hysterectomy with oophorectomy 01/03/2013   HTN (hypertension) 10/19/2012   B12 deficiency 04/16/2012   Anxiety state 04/16/2012   Generalized anxiety disorder 04/16/2012   Neuropathy of both feet 09/18/2011   Hyperlipidemia LDL goal <70 03/21/2011   Insomnia due to anxiety and fear 03/21/2011   Osteoporosis     Everlean Alstrom. Graylon Good, PT, DPT 02/20/21, 5:52 PM   [facility] [address] [place], [address] Phone: [phone]   Fax:  [phone]  Name: MALAY FANTROY MRN: [id number] Date of Birth: July 01, 1932

## 2021-02-22 ENCOUNTER — Encounter: Payer: Medicare PPO | Admitting: Physical Therapy

## 2021-02-27 ENCOUNTER — Ambulatory Visit: Payer: Medicare PPO | Admitting: Physical Therapy

## 2021-02-27 ENCOUNTER — Encounter: Payer: Self-pay | Admitting: Internal Medicine

## 2021-02-27 ENCOUNTER — Other Ambulatory Visit: Payer: Self-pay | Admitting: Internal Medicine

## 2021-02-28 MED ORDER — ESCITALOPRAM OXALATE 10 MG PO TABS: 10.0000 mg | ORAL_TABLET | Freq: Every day | ORAL | 1 refills | Status: DC

## 2021-03-01 ENCOUNTER — Encounter: Payer: Medicare PPO | Admitting: Physical Therapy

## 2021-03-06 ENCOUNTER — Encounter: Payer: Medicare PPO | Admitting: Physical Therapy

## 2021-03-08 ENCOUNTER — Encounter: Payer: Medicare PPO | Admitting: Physical Therapy

## 2021-03-09 ENCOUNTER — Encounter: Payer: Self-pay | Admitting: Internal Medicine

## 2021-03-15 ENCOUNTER — Ambulatory Visit: Payer: Medicare PPO | Admitting: Adult Health

## 2021-03-30 DIAGNOSIS — H35372 Puckering of macula, left eye: Secondary | ICD-10-CM | POA: Diagnosis not present

## 2021-03-30 DIAGNOSIS — H2513 Age-related nuclear cataract, bilateral: Secondary | ICD-10-CM | POA: Diagnosis not present

## 2021-03-30 DIAGNOSIS — H524 Presbyopia: Secondary | ICD-10-CM | POA: Diagnosis not present

## 2021-05-08 DIAGNOSIS — D2271 Melanocytic nevi of right lower limb, including hip: Secondary | ICD-10-CM | POA: Diagnosis not present

## 2021-05-08 DIAGNOSIS — D2262 Melanocytic nevi of left upper limb, including shoulder: Secondary | ICD-10-CM | POA: Diagnosis not present

## 2021-05-08 DIAGNOSIS — L538 Other specified erythematous conditions: Secondary | ICD-10-CM | POA: Diagnosis not present

## 2021-05-08 DIAGNOSIS — L820 Inflamed seborrheic keratosis: Secondary | ICD-10-CM | POA: Diagnosis not present

## 2021-05-08 DIAGNOSIS — D225 Melanocytic nevi of trunk: Secondary | ICD-10-CM | POA: Diagnosis not present

## 2021-05-08 DIAGNOSIS — R202 Paresthesia of skin: Secondary | ICD-10-CM | POA: Diagnosis not present

## 2021-05-08 DIAGNOSIS — D2261 Melanocytic nevi of right upper limb, including shoulder: Secondary | ICD-10-CM | POA: Diagnosis not present

## 2021-05-08 DIAGNOSIS — L218 Other seborrheic dermatitis: Secondary | ICD-10-CM | POA: Diagnosis not present

## 2021-05-08 DIAGNOSIS — D2272 Melanocytic nevi of left lower limb, including hip: Secondary | ICD-10-CM | POA: Diagnosis not present

## 2021-05-25 DIAGNOSIS — H02132 Senile ectropion of right lower eyelid: Secondary | ICD-10-CM | POA: Diagnosis not present

## 2021-06-02 ENCOUNTER — Telehealth: Payer: Self-pay | Admitting: *Deleted

## 2021-06-02 NOTE — Telephone Encounter (Signed)
Prolia prior auth approved on 06/02/21 through Cover my Meds.  ?$0 due for injections. ? ?Left voicemail for pt to return call. Need to notify her that she can get her Prolia the same day of her OV with Dr. Darrick Huntsman on 06/09/21. ?

## 2021-06-05 NOTE — Telephone Encounter (Signed)
FYI: Spoke with patient this morning & she has concerns of dental issues. She will discuss at visit on Friday 5/12 to determine if she should continue with Prolia or not. ?

## 2021-06-09 ENCOUNTER — Ambulatory Visit (INDEPENDENT_AMBULATORY_CARE_PROVIDER_SITE_OTHER): Payer: Medicare PPO

## 2021-06-09 ENCOUNTER — Encounter: Payer: Self-pay | Admitting: Internal Medicine

## 2021-06-09 ENCOUNTER — Ambulatory Visit: Payer: Medicare PPO | Admitting: Internal Medicine

## 2021-06-09 VITALS — BP 134/72 | HR 71 | Temp 98.0°F | Ht 64.0 in | Wt 142.2 lb

## 2021-06-09 DIAGNOSIS — M545 Low back pain, unspecified: Secondary | ICD-10-CM

## 2021-06-09 DIAGNOSIS — G8929 Other chronic pain: Secondary | ICD-10-CM

## 2021-06-09 DIAGNOSIS — M5136 Other intervertebral disc degeneration, lumbar region: Secondary | ICD-10-CM | POA: Diagnosis not present

## 2021-06-09 DIAGNOSIS — I10 Essential (primary) hypertension: Secondary | ICD-10-CM | POA: Diagnosis not present

## 2021-06-09 DIAGNOSIS — E559 Vitamin D deficiency, unspecified: Secondary | ICD-10-CM | POA: Diagnosis not present

## 2021-06-09 DIAGNOSIS — E785 Hyperlipidemia, unspecified: Secondary | ICD-10-CM

## 2021-06-09 DIAGNOSIS — M533 Sacrococcygeal disorders, not elsewhere classified: Secondary | ICD-10-CM

## 2021-06-09 DIAGNOSIS — K649 Unspecified hemorrhoids: Secondary | ICD-10-CM

## 2021-06-09 DIAGNOSIS — M810 Age-related osteoporosis without current pathological fracture: Secondary | ICD-10-CM

## 2021-06-09 MED ORDER — OMEPRAZOLE 20 MG PO CPDR: 20.0000 mg | DELAYED_RELEASE_CAPSULE | Freq: Every day | ORAL | 3 refills | Status: DC

## 2021-06-09 MED ORDER — ATORVASTATIN CALCIUM 20 MG PO TABS
20.0000 mg | ORAL_TABLET | Freq: Every day | ORAL | 1 refills | Status: DC
Start: 2021-06-09 — End: 2021-12-04

## 2021-06-09 MED ORDER — DENOSUMAB 60 MG/ML ~~LOC~~ SOSY
60.0000 mg | PREFILLED_SYRINGE | Freq: Once | SUBCUTANEOUS | Status: AC
  Administered 2021-06-09: 60 mg via SUBCUTANEOUS

## 2021-06-09 NOTE — Patient Instructions (Addendum)
You can continue using 400 mg ibuprofen twice daily  ? ? ?You can add up to 2000 mg of acetominophen (tylenol) every day safely  In divided doses ( Or 1000 mg every 12 hours.)  ? ? ?I have added omeprazole to take once daily  to protect your stomach  while you are taking the ibuprofen  ? ? ?The enflamed hemorrhoid may occur again if BMS are hard to pass ? ?Use Anusol HC.  Or call for alternative  ? ?

## 2021-06-09 NOTE — Assessment & Plan Note (Signed)
Exacerbation of pain 10 days ago after pulling on a root in the yard ten days ago does not radiate beyond buttocks  ?

## 2021-06-09 NOTE — Progress Notes (Addendum)
Subjective:  Patient ID: Laura [name], female    DOB: 1932/10/19  Age: 89 y.o. MRN: [id number]  CC: The primary encounter diagnosis was Hyperlipidemia LDL goal <70. Diagnoses of Vitamin D deficiency, DDD (degenerative disc disease), lumbar, Chronic right SI joint pain, Age-related osteoporosis without current pathological fracture, Hemorrhoids, unspecified hemorrhoid type, Primary hypertension, and Chronic midline low back pain without sciatica were also pertinent to this visit.   HPI Laura [name] presents for evaluation of multiple complaints  Chief Complaint  Patient presents with   Acute Visit    Anal pain, swelling and puffiness   1) anal pain , accompanied by the feeling of a swollen anus. Symptoms now resolved. And ccurred in the setting of constipation  2 New onset Back pain since pulling weeds in her year  ten days ago.  FElt a pop after pulling at a small seedling tree which would not give up  3) Hypertension: patient checks blood pressure twice weekly at home.  Readings have been for the most part < 140/80 at rest . Patient is following a reduce salt diet most days and is taking medications as prescribed    Outpatient Medications Prior to Visit  Medication Sig Dispense Refill   ALPRAZolam (XANAX) 0.5 MG tablet Take 0.5 tablets (0.25 mg total) by mouth 2 (two) times daily as needed for anxiety or sleep. (Patient not taking: Reported on 06/22/2021) 30 tablet 0   amLODipine (NORVASC) 2.5 MG tablet TAKE 1 TABLET(2.5 MG) BY MOUTH DAILY 90 tablet 3   calcium carbonate (OS-CAL) 600 MG TABS Take 600 mg by mouth 2 (two) times daily with a meal.     Cholecalciferol (VITAMIN D3) 50 MCG (2000 UT) CAPS Take 1 capsule by mouth daily.     Coenzyme Q10 (CO Q 10) 100 MG CAPS Take 1 capsule by mouth daily.     Cyanocobalamin 2500 MCG SUBL PLACE 1 TABLET UNDER THE TONGUE DAILY AS DIRECTED 90 tablet 1   denosumab (PROLIA) 60 MG/ML SOLN injection Inject 60 mg into the skin every 6 (six)  months. Administer in upper arm, thigh, or abdomen     escitalopram (LEXAPRO) 10 MG tablet Take 1 tablet (10 mg total) by mouth daily. 90 tablet 1   magnesium oxide (MAG-OX) 400 MG tablet Take 400 mg by mouth 2 (two) times daily.     Multiple Vitamins-Minerals (PRESERVISION AREDS 2 PO) Take 1 capsule by mouth daily.     psyllium (METAMUCIL) 58.6 % packet Take 1 packet by mouth daily.     Riboflavin 100 MG CAPS Take 1 capsule by mouth 2 (two) times daily.      zolpidem (AMBIEN) 10 MG tablet TAKE 1 TABLET(10 MG) BY MOUTH AT BEDTIME.  NOT DOSE INCREASE;  5 MG NOT WORKING 30 tablet 5   atorvastatin (LIPITOR) 20 MG tablet      nitroGLYCERIN (NITROSTAT) 0.4 MG SL tablet Place 1 tablet (0.4 mg total) under the tongue every 5 (five) minutes as needed for chest pain. (Patient not taking: Reported on 06/22/2021) 50 tablet 3   Facility-Administered Medications Prior to Visit  Medication Dose Route Frequency Provider Last Rate Last Admin   denosumab (PROLIA) injection 60 mg  60 mg Subcutaneous Once Sherlene Shams, MD        Review of Systems;  Patient denies headache, fevers, malaise, unintentional weight loss, skin rash, eye pain, sinus congestion and sinus pain, sore throat, dysphagia,  hemoptysis , cough, dyspnea, wheezing, chest pain, palpitations, orthopnea, edema,  abdominal pain, nausea, melena, diarrhea, constipation, flank pain, dysuria, hematuria, urinary  Frequency, nocturia, numbness, tingling, seizures,  Focal weakness, Loss of consciousness,  Tremor, insomnia, depression, anxiety, and suicidal ideation.      Objective:  BP 134/72 (BP Location: Left Arm, Patient Position: Sitting, Cuff Size: Normal)   Pulse 71   Temp 98 F (36.7 C) (Oral)   Ht 5\' 4"  (1.626 m)   Wt 142 lb 3.2 oz (64.5 kg)   SpO2 96%   BMI 24.41 kg/m   BP Readings from Last 3 Encounters:  06/22/21 128/68  06/09/21 134/72  02/01/21 134/76    Wt Readings from Last 3 Encounters:  06/22/21 142 lb (64.4 kg)  06/09/21  142 lb 3.2 oz (64.5 kg)  02/01/21 141 lb (64 kg)    General appearance: alert, cooperative and appears stated age Ears: normal TM's and external ear canals both ears Throat: lips, mucosa, and tongue normal; teeth and gums normal Neck: no adenopathy, no carotid bruit, supple, symmetrical, trachea midline and thyroid not enlarged, symmetric, no tenderness/mass/nodules Back: symmetric, no curvature. ROM normal. No CVA tenderness. Lungs: clear to auscultation bilaterally Heart: regular rate and rhythm, S1, S2 normal, no murmur, click, rub or gallop Abdomen: soft, non-tender; bowel sounds normal; no masses,  no organomegaly Pulses: 2+ and symmetric Skin: Skin color, texture, turgor normal. No rashes or lesions Lymph nodes: Cervical, supraclavicular, and axillary nodes normal. MSK;  negative straight leg lift bilaterally.  Pain in sacral area near SI joint on the right  Lab Results  Component Value Date   HGBA1C 5.6 02/10/2019   HGBA1C 5.4 05/05/2014    Lab Results  Component Value Date   CREATININE 0.74 06/09/2021   CREATININE 0.65 12/14/2020   CREATININE 0.69 06/17/2020    Lab Results  Component Value Date   WBC 6.7 12/14/2020   HGB 13.4 12/14/2020   HCT 40.9 12/14/2020   PLT 270.0 12/14/2020   GLUCOSE 85 06/09/2021   CHOL 166 01/05/2021   TRIG 88.0 01/05/2021   HDL 89.70 01/05/2021   LDLDIRECT 109.7 09/27/2011   LDLCALC 58 01/05/2021   ALT 17 06/09/2021   AST 16 06/09/2021   NA 139 06/09/2021   K 4.2 06/09/2021   CL 104 06/09/2021   CREATININE 0.74 06/09/2021   BUN 15 06/09/2021   CO2 26 06/09/2021   TSH 2.79 12/14/2020   INR 1.1 06/17/2020   HGBA1C 5.6 02/10/2019   MICROALBUR <0.7 06/01/2020    No results found.  Assessment & Plan:   Problem List Items Addressed This Visit     DDD (degenerative disc disease), lumbar    Exacerbation of pain 10 days ago after pulling on a root in the yard ten days ago does not radiate beyond buttocks        Hemorrhoid     Recommended stool softeners and use of anusol hc nupercainal for next occurrence        Relevant Medications   atorvastatin (LIPITOR) 20 MG tablet   HTN (hypertension)    Well controlled on current regimen of almlodipine . Renal function stable, no changes today.       Relevant Medications   atorvastatin (LIPITOR) 20 MG tablet   Hyperlipidemia LDL goal <70 - Primary   Relevant Medications   atorvastatin (LIPITOR) 20 MG tablet   Other Relevant Orders   COMPLETE METABOLIC PANEL WITH GFR (Completed)   Low back pain    Non radiating ,  Near her SI joint.  Will order films  to Rule out fracture.   Use NSAID and tylenol        Osteoporosis   Other Visit Diagnoses     Vitamin D deficiency       Relevant Orders   VITAMIN D 25 Hydroxy (Vit-D Deficiency, Fractures) (Completed)   Chronic right SI joint pain       Relevant Orders   DG Lumbar Spine Complete (Completed)   DG Sacrum/Coccyx (Completed)       I spent a total of   20   minutes with this patient in a face to face visit on the date of this encounter reviewing  her most recent  physical therapy outcome, prior plain films ,  her last office visit with me in January 2023,       and post visit ordering of testing and therapeutics.    Follow-up: Return in about 6 months (around 12/10/2021).   Sherlene Shams, MD

## 2021-06-10 LAB — VITAMIN D 25 HYDROXY (VIT D DEFICIENCY, FRACTURES): Vit D, 25-Hydroxy: 39 ng/mL (ref 30–100)

## 2021-06-10 LAB — COMPLETE METABOLIC PANEL WITH GFR
AG Ratio: 2.1 (calc) (ref 1.0–2.5)
ALT: 17 U/L (ref 6–29)
AST: 16 U/L (ref 10–35)
Albumin: 4.4 g/dL (ref 3.6–5.1)
Alkaline phosphatase (APISO): 43 U/L (ref 37–153)
BUN: 15 mg/dL (ref 7–25)
CO2: 26 mmol/L (ref 20–32)
Calcium: 9.7 mg/dL (ref 8.6–10.4)
Chloride: 104 mmol/L (ref 98–110)
Creat: 0.74 mg/dL (ref 0.60–0.95)
Globulin: 2.1 g/dL (calc) (ref 1.9–3.7)
Glucose, Bld: 85 mg/dL (ref 65–99)
Potassium: 4.2 mmol/L (ref 3.5–5.3)
Sodium: 139 mmol/L (ref 135–146)
Total Bilirubin: 0.4 mg/dL (ref 0.2–1.2)
Total Protein: 6.5 g/dL (ref 6.1–8.1)
eGFR: 79 mL/min/{1.73_m2} (ref >=60)

## 2021-06-11 DIAGNOSIS — K649 Unspecified hemorrhoids: Secondary | ICD-10-CM | POA: Insufficient documentation

## 2021-06-11 NOTE — Assessment & Plan Note (Signed)
Recommended stool softeners and use of anusol hc nupercainal for next occurrence  ?

## 2021-06-11 NOTE — Assessment & Plan Note (Signed)
Non radiating ,  Near her SI joint.  Will order films to Rule out fracture.   Use NSAID and tylenol  ?

## 2021-06-11 NOTE — Assessment & Plan Note (Signed)
Well controlled on current regimen of almlodipine . Renal function stable, no changes today. ?

## 2021-06-12 ENCOUNTER — Telehealth: Payer: Self-pay | Admitting: Internal Medicine

## 2021-06-12 NOTE — Telephone Encounter (Signed)
Spoke with patient she req CB next week 5/22 ?

## 2021-06-19 NOTE — Telephone Encounter (Signed)
Pt received Prolia injection on 06/09/21

## 2021-06-22 ENCOUNTER — Ambulatory Visit: Payer: Medicare PPO | Admitting: Internal Medicine

## 2021-06-22 ENCOUNTER — Ambulatory Visit (INDEPENDENT_AMBULATORY_CARE_PROVIDER_SITE_OTHER): Payer: Medicare PPO

## 2021-06-22 ENCOUNTER — Encounter: Payer: Self-pay | Admitting: Internal Medicine

## 2021-06-22 VITALS — BP 128/68 | HR 93 | Temp 98.1°F | Ht 64.0 in | Wt 142.0 lb

## 2021-06-22 DIAGNOSIS — M545 Low back pain, unspecified: Secondary | ICD-10-CM | POA: Diagnosis not present

## 2021-06-22 DIAGNOSIS — M25551 Pain in right hip: Secondary | ICD-10-CM

## 2021-06-22 DIAGNOSIS — M1611 Unilateral primary osteoarthritis, right hip: Secondary | ICD-10-CM | POA: Diagnosis not present

## 2021-06-22 DIAGNOSIS — I878 Other specified disorders of veins: Secondary | ICD-10-CM | POA: Diagnosis not present

## 2021-06-22 MED ORDER — TRAMADOL HCL 50 MG PO TABS: 50.0000 mg | ORAL_TABLET | Freq: Three times a day (TID) | ORAL | 0 refills | Status: AC | PRN

## 2021-06-22 MED ORDER — PREDNISONE 10 MG PO TABS: ORAL_TABLET | ORAL | 0 refills | Status: DC

## 2021-06-22 NOTE — Patient Instructions (Signed)
I think you gave yourself a trochanteric bursitis from excessive walking  I recommend a Trial of prednisone in an 8 daytaper in lieu of motrin/aleve/advil  Continue tylenol up to 2000 mg daily in divided doses    Add tramadol every 8 hours if needed   PT referral in progress  Referral to Ortho if pain in hip returns after prednisone wears off

## 2021-06-22 NOTE — Progress Notes (Signed)
Subjective:  Patient ID: Laura [name], female    DOB: 01-15-1933  Age: 88 y.o. MRN: [id number]  CC: The primary encounter diagnosis was Hip pain, right. A diagnosis of Acute right-sided low back pain without sciatica was also pertinent to this visit.   HPI Laura [name] presents for  Chief Complaint  Patient presents with   Follow-up    On going back pain now radiating into hip   88 yr old female with history of hypertension,  aortic atherosclerosis , cervical radiculopathy secondary to DJD , osteoporosis managed with Prolia,  and new onset  low back pain presents for follow up on low back pain.  Last seen 2 weeks ago   Plain films of lumbar  spine and sacrum  were negative 2 weeks ago for fracture and for evidence of disk herniation .  Bone spurring at multiple levels were noted and scoliosis was noted.  Since her visit she has been using motrin and tylenol which reduces pain from an 8 to a 5 . She has been walking for up to an hour daily because her pain is aggravated by sitting and improves with standing.  For the last several nights she has been woken up by right lateral hip pain.  She typically sleeps on her right side .   The pain does not radiate down the leg .  She denies any right leg weakness .  Her main concern is being able to continue her active lifestyle which includes yoga, gardening and walking     Outpatient Medications Prior to Visit  Medication Sig Dispense Refill   amLODipine (NORVASC) 2.5 MG tablet TAKE 1 TABLET(2.5 MG) BY MOUTH DAILY 90 tablet 3   atorvastatin (LIPITOR) 20 MG tablet Take 1 tablet (20 mg total) by mouth daily. 90 tablet 1   calcium carbonate (OS-CAL) 600 MG TABS Take 600 mg by mouth 2 (two) times daily with a meal.     Cholecalciferol (VITAMIN D3) 50 MCG (2000 UT) CAPS Take 1 capsule by mouth daily.     Coenzyme Q10 (CO Q 10) 100 MG CAPS Take 1 capsule by mouth daily.     Cyanocobalamin 2500 MCG SUBL PLACE 1 TABLET UNDER THE TONGUE DAILY AS  DIRECTED 90 tablet 1   denosumab (PROLIA) 60 MG/ML SOLN injection Inject 60 mg into the skin every 6 (six) months. Administer in upper arm, thigh, or abdomen     escitalopram (LEXAPRO) 10 MG tablet Take 1 tablet (10 mg total) by mouth daily. 90 tablet 1   magnesium oxide (MAG-OX) 400 MG tablet Take 400 mg by mouth 2 (two) times daily.     Multiple Vitamins-Minerals (PRESERVISION AREDS 2 PO) Take 1 capsule by mouth daily.     omeprazole (PRILOSEC) 20 MG capsule Take 1 capsule (20 mg total) by mouth daily. 30 capsule 3   psyllium (METAMUCIL) 58.6 % packet Take 1 packet by mouth daily.     Riboflavin 100 MG CAPS Take 1 capsule by mouth 2 (two) times daily.      zolpidem (AMBIEN) 10 MG tablet TAKE 1 TABLET(10 MG) BY MOUTH AT BEDTIME.  NOT DOSE INCREASE;  5 MG NOT WORKING 30 tablet 5   ALPRAZolam (XANAX) 0.5 MG tablet Take 0.5 tablets (0.25 mg total) by mouth 2 (two) times daily as needed for anxiety or sleep. (Patient not taking: Reported on 06/22/2021) 30 tablet 0   nitroGLYCERIN (NITROSTAT) 0.4 MG SL tablet Place 1 tablet (0.4 mg total) under the tongue  every 5 (five) minutes as needed for chest pain. (Patient not taking: Reported on 06/22/2021) 50 tablet 3   Facility-Administered Medications Prior to Visit  Medication Dose Route Frequency Provider Last Rate Last Admin   denosumab (PROLIA) injection 60 mg  60 mg Subcutaneous Once Sherlene Shamsullo, Isabelle Matt L, MD        Review of Systems;  Patient denies headache, fevers, malaise, unintentional weight loss, skin rash, eye pain, sinus congestion and sinus pain, sore throat, dysphagia,  hemoptysis , cough, dyspnea, wheezing, chest pain, palpitations, orthopnea, edema, abdominal pain, nausea, melena, diarrhea, constipation, flank pain, dysuria, hematuria, urinary  Frequency, nocturia, numbness, tingling, seizures,  Focal weakness, Loss of consciousness,  Tremor, insomnia, depression, anxiety, and suicidal ideation.      Objective:  BP 128/68 (BP Location: Left  Arm, Patient Position: Sitting, Cuff Size: Small)   Pulse 93   Temp 98.1 F (36.7 C) (Temporal)   Ht 5\' 4"  (1.626 m)   Wt 142 lb (64.4 kg)   SpO2 98%   BMI 24.37 kg/m   BP Readings from Last 3 Encounters:  06/22/21 128/68  06/09/21 134/72  02/01/21 134/76    Wt Readings from Last 3 Encounters:  06/22/21 142 lb (64.4 kg)  06/09/21 142 lb 3.2 oz (64.5 kg)  02/01/21 141 lb (64 kg)    General appearance: alert, cooperative and appears stated age Ears: normal TM's and external ear canals both ears Throat: lips, mucosa, and tongue normal; teeth and gums normal Neck: no adenopathy, no carotid bruit, supple, symmetrical, trachea midline and thyroid not enlarged, symmetric, no tenderness/mass/nodules Back: symmetric, no curvature. ROM normal. No CVA tenderness. Lungs: clear to auscultation bilaterally Heart: regular rate and rhythm, S1, S2 normal, no murmur, click, rub or gallop Abdomen: soft, non-tender; bowel sounds normal; no masses,  no organomegaly Pulses: 2+ and symmetric Skin: Skin color, texture, turgor normal. No rashes or lesions Lymph nodes: Cervical, supraclavicular, and axillary nodes normal. MSK:  negative straight leg lift.  Palpable tenderness over the right trochanter., aggravate d with abduction and external rotation    Lab Results  Component Value Date   HGBA1C 5.6 02/10/2019   HGBA1C 5.4 05/05/2014    Lab Results  Component Value Date   CREATININE 0.74 06/09/2021   CREATININE 0.65 12/14/2020   CREATININE 0.69 06/17/2020    Lab Results  Component Value Date   WBC 6.7 12/14/2020   HGB 13.4 12/14/2020   HCT 40.9 12/14/2020   PLT 270.0 12/14/2020   GLUCOSE 85 06/09/2021   CHOL 166 01/05/2021   TRIG 88.0 01/05/2021   HDL 89.70 01/05/2021   LDLDIRECT 109.7 09/27/2011   LDLCALC 58 01/05/2021   ALT 17 06/09/2021   AST 16 06/09/2021   NA 139 06/09/2021   K 4.2 06/09/2021   CL 104 06/09/2021   CREATININE 0.74 06/09/2021   BUN 15 06/09/2021   CO2 26  06/09/2021   TSH 2.79 12/14/2020   INR 1.1 06/17/2020   HGBA1C 5.6 02/10/2019   MICROALBUR <0.7 06/01/2020    No results found.  Assessment & Plan:   Problem List Items Addressed This Visit     Hip pain, right - Primary    Exam and histroy suggestive of trochanteric bursitis.  Plain films ordered.  Steroid taper, tramadol for moderate pain, and  PT ordered.   follow up one month        Relevant Orders   DG Hip Unilat W OR W/O Pelvis 2-3 Views Right (Completed)   Ambulatory referral  to Physical Therapy   Low back pain    Reviewed plain films done at last visit with patient.  Back extension exercises demonstrated.  She was able to perform the exercise without aggravation of her low back pain .  Advised her to incorporate core strengthening exercises in her regular exercise routine and avoid lifting or laboring in a way that places undue weight bearing and stress on lower spine.        Relevant Medications   traMADol (ULTRAM) 50 MG tablet   predniSONE (DELTASONE) 10 MG tablet   Other Relevant Orders   Ambulatory referral to Physical Therapy    I spent a total of  28 minutes with this patient in a face to face visit on the date of this encounter reviewing the last office visit with me 2 weeks ago,  her lumbosacral spine films. Demonstrating a diagnostic and therapeutic exercise , nd post visit ordering of testing and therapeutics.    Follow-up: Return in about 2 weeks (around 07/06/2021).   Sherlene Shams, MD

## 2021-06-23 DIAGNOSIS — M25551 Pain in right hip: Secondary | ICD-10-CM | POA: Insufficient documentation

## 2021-06-23 NOTE — Assessment & Plan Note (Addendum)
Exam and histroy suggestive of trochanteric bursitis.  Plain films ordered.  Steroid taper, tramadol for moderate pain, and  PT ordered.   follow up one month

## 2021-06-23 NOTE — Assessment & Plan Note (Signed)
Reviewed plain films done at last visit with patient.  Back extension exercises demonstrated.  She was able to perform the exercise without aggravation of her low back pain .  Advised her to incorporate core strengthening exercises in her regular exercise routine and avoid lifting or laboring in a way that places undue weight bearing and stress on lower spine.

## 2021-06-27 ENCOUNTER — Telehealth: Payer: Self-pay | Admitting: Internal Medicine

## 2021-06-27 NOTE — Telephone Encounter (Signed)
Copied from CRM [id number]. Topic: Medicare AWV >> Jun 27, 2021 10:14 AM Harris-Coley, [place]on Gully wrote: Reason for CRM: Attempted to schedule AWV. Unable to LVM.  Will try at later time.

## 2021-07-04 ENCOUNTER — Telehealth: Payer: Self-pay | Admitting: Physical Therapy

## 2021-07-04 NOTE — Telephone Encounter (Signed)
Attempted to give pt a call to move ahead in schedule. Did not reach and could not message because VM is full.

## 2021-07-05 ENCOUNTER — Other Ambulatory Visit: Payer: Self-pay

## 2021-07-05 ENCOUNTER — Ambulatory Visit: Payer: Medicare PPO | Attending: Internal Medicine | Admitting: Physical Therapy

## 2021-07-05 DIAGNOSIS — M25551 Pain in right hip: Secondary | ICD-10-CM | POA: Diagnosis not present

## 2021-07-05 DIAGNOSIS — M545 Low back pain, unspecified: Secondary | ICD-10-CM | POA: Diagnosis not present

## 2021-07-05 DIAGNOSIS — M1611 Unilateral primary osteoarthritis, right hip: Secondary | ICD-10-CM | POA: Diagnosis not present

## 2021-07-05 NOTE — Therapy (Signed)
OUTPATIENT PHYSICAL THERAPY LOWER EXTREMITY EVALUATION   Patient Name: Laura [name] MRN: [id number] DOB:04-Sep-1932, 88 y.o., female Today's Date: 07/05/2021   PT End of Session - 07/05/21 2132     Visit Number 1    Number of Visits 10    Date for PT Re-Evaluation 09/13/21    PT Start Time 1630    PT Stop Time 1715    PT Time Calculation (min) 45 min    Activity Tolerance Patient tolerated treatment well    Behavior During Therapy [facility] for tasks assessed/performed             Past Medical History:  Diagnosis Date   Accidental fall 10/16/2013   Anxiety    Carotid stenosis    Left    Fracture of rib 09/26/2019   High cholesterol    Hypertension    Pt states that her blood pressure is very well controlled   Migraine with aura    Neuropathy    Osteoporosis    Past Surgical History:  Procedure Laterality Date   ABDOMINAL HYSTERECTOMY     secondary to peritonitis and complications    APPENDECTOMY     TONSILLECTOMY     TUBAL LIGATION     at age 42, vaginal complicated by peritonitis   Patient Active Problem List   Diagnosis Date Noted   Hip pain, right 06/23/2021   Hemorrhoid 06/11/2021   History of rib fracture 01/07/2021   Cervical radiculopathy 01/07/2021   Low blood pressure reading 12/18/2020   COVID-19 virus infection 09/14/2020   Gait disorder 06/04/2020   Aortic atherosclerosis (HCC) 05/02/2020   Altered bowel function 02/03/2020   Dizziness 12/31/2018   Degenerative joint disease (DJD) of hip 11/29/2018   Bursitis of left hip 05/31/2018   Encounter for preventive health examination 07/31/2016   Lumbar radiculitis 12/03/2014   DDD (degenerative disc disease), lumbar 12/03/2014   Intracranial arachnoid cyst 10/25/2014   Low back pain 08/20/2014   Migraine aura without headache 04/24/2014   Encounter for Medicare annual wellness exam 11/01/2013   S/P hysterectomy with oophorectomy 01/03/2013   HTN (hypertension) 10/19/2012   B12 deficiency  04/16/2012   Anxiety state 04/16/2012   Generalized anxiety disorder 04/16/2012   Neuropathy of both feet 09/18/2011   Hyperlipidemia LDL goal <70 03/21/2011   Insomnia due to anxiety and fear 03/21/2011   Osteoporosis     PCP: Dr. Darrick Huntsman   REFERRING PROVIDER: Dr. Darrick Huntsman  REFERRING DIAG: Right sided low back pain and right sided hip pain   THERAPY DIAG:  Pain in right hip  Primary osteoarthritis of right hip  Rationale for Evaluation and Treatment Rehabilitation  ONSET DATE: 06/12/21  SUBJECTIVE:   SUBJECTIVE STATEMENT: Pt reports that when she was gardening she felt like she strained a muscle in her back. After some time the pain in her low back travel to her right hip. The right hip pain made her wake up at night. She has had buttocks pain for years and both her right hip and buttocks pain resolved after taking prednisone.   PERTINENT HISTORY:   Per Dr. Melina Schools Note on 06/22/21  Hip pain, right - Primary       Exam and histroy suggestive of trochanteric bursitis.  Plain films ordered.  Steroid taper, tramadol for moderate pain, and  PT ordered.   follow up one month         Relevant Orders   DG Hip Unilat W OR W/O Pelvis 2-3 Views Right (Completed)  Ambulatory referral to Physical Therapy   Low back pain      Reviewed plain films done at last visit with patient.  Back extension exercises demonstrated.  She was able to perform the exercise without aggravation of her low back pain .  Advised her to incorporate core strengthening exercises in her regular exercise routine and avoid lifting or laboring in a way that places undue weight bearing and stress on lower spine   PAIN:  Are you having pain? No -Hip pain when she felt it hurt all the time and hurt most at night to the point where she woke up at night.   PRECAUTIONS: None  WEIGHT BEARING RESTRICTIONS No  FALLS:  Has patient fallen in last 6 months? No  LIVING ENVIRONMENT: Lives with: lives alone Lives in:  House/apartment Stairs: Yes: External: 3 steps; can reach both Has following equipment at home: Grab bars and None  OCCUPATION: Retired   PLOF: Independent  PATIENT GOALS Feel less hip pain    OBJECTIVE:               VITALS: BP 143/69 HR 72 SpO2 97  DIAGNOSTIC FINDINGS:   EXAM: DG HIP (WITH OR WITHOUT PELVIS) 2-3V RIGHT   COMPARISON:  Bilateral sacroiliac joint radiographs 06/09/2021; CT abdomen and pelvis 03/18/2020   FINDINGS: Severe joint space narrowing and peripheral osteophytosis degenerative change of the pubic symphysis. Mild bilateral femoroacetabular joint space narrowing. Mild bilateral sacroiliac joint subchondral sclerosis without joint space narrowing. No acute fracture or dislocation. Vascular phleboliths overlie the left hemipelvis.   IMPRESSION: 1. Mild bilateral femoroacetabular osteoarthritis. 2. Severe pubic symphysis osteoarthritis.     Electronically Signed   By: Neita Garnet M.D.   On: 06/23/2021 13:40  PATIENT SURVEYS:  FOTO 56/64  COGNITION:  Overall cognitive status: Within functional limits for tasks assessed     SENSATION: WFL  EDEMA: None noted   MUSCLE LENGTH: Hamstrings: Right 90 deg; Left 90 deg Thomas test: - Bilateral   POSTURE: No Significant postural limitations  PALPATION: Lateral side of right hip over greater trochanter TTP  - Cauda Equina Syndrome: Negative to all symptoms  Bladder/bowel dysfunction Saddle anesthesia  Sexual dysfunction Possible neurological deficits in the lower limb (motor or sensory loss, reflex change)   MUSCULOSKELETAL:   AROM (in standing):         Lumbar flexion: 100%                ext: 100%                SB R/L: 100%                Rot R/L: 100%   LOWER EXTREMITY ROM:    Active  Right 07/05/2021 Left 07/05/2021  Hip flexion 120 120  Hip extension 30 30  Hip abduction 45 45  Hip adduction 30 30  Hip internal rotation 45 45  Hip external rotation 45 45  Knee  flexion 125* 135  Knee extension 0 0  Ankle dorsiflexion 20 20  Ankle plantarflexion 50 50  Ankle inversion 35 35  Ankle eversion 15 15   (Blank rows = not tested)     LOWER EXTREMITY MMT:  MMT Right eval Left eval  Hip flexion 5 5  Hip extension 5 5  Hip abduction 5 5  Hip adduction 4+ 4+  Hip internal rotation 5 5  Hip external rotation 5 5  Knee flexion 5 5  Knee extension 5 5  Ankle dorsiflexion    Ankle plantarflexion    Ankle inversion    Ankle eversion     (Blank rows = not tested)  LOWER EXTREMITY SPECIAL TESTS:  Hip special tests: Luisa Hart (FABER) test: positive , Trendelenburg test: positive , Thomas test: negative, Craig's test: negative, Ely's test: positive , and SI compression test: negative, FADIR + R  FUNCTIONAL TESTS:  Squat- Anterior translation of knees with no varus or valgus moments   GAIT: Distance walked: 25 ft Assistive device utilized: None Level of assistance: Complete Independence Comments: No gait abnormalities noted    TODAY'S TREATMENT: Prone Quad Stretch 2 x 30 sec  -Slight pain noted in right knee  Side Lying Hip Adduction 2 x 10   PATIENT EDUCATION:  Education details: form and technique for appropriate exercise and description of underlying pathology  Person educated: Patient Education method: Explanation, Demonstration, Verbal cues, and Handouts Education comprehension: verbalized understanding, returned demonstration, tactile cues required, and needs further education   HOME EXERCISE PROGRAM: Access Code: [email or web address] URL: [email or web address] Date: 07/05/2021 Prepared by: Ellin Goodie  Exercises - Prone Quadriceps Stretch with Strap  - 1 x daily - 7 x weekly - 1 sets - 3 reps - 60 hold - Sidelying Hip Adduction  - 1 x daily - 3 x weekly - 3 sets - 10 reps  ASSESSMENT:  CLINICAL IMPRESSION: Patient is an 88 y.o. white woman who was seen today for physical therapy evaluation and treatment for right  sided hip pain. Pt exhibits signs and symptoms indicative of right hip OA with increased age, pain first thing in morning that resolves with movement and worsens with weight bearing activity throughout day, and increased pain with increased hip flexion with internal rotation and mild OA of FA joint noted on x-ray. Trochanteric bursitis ruled out with no pain elicited with resisted hip abduction and negative hip ER derotation test. She shows minor hip strength, flexibility, and ROM deficits, but pain is likely much less than baseline with recent treatment with corticosteriods. She will benefit from PT to improve hip strength, flexibility, and ROM in order to return to bending for gardening and walking for prolonged distances.    OBJECTIVE IMPAIRMENTS decreased ROM, decreased strength, and pain.   ACTIVITY LIMITATIONS bending, sitting, standing, squatting, sleeping, stairs, and gardening  PARTICIPATION LIMITATIONS: shopping, community activity, and yard work  PERSONAL FACTORS Age, Past/current experiences, and Time since onset of injury/illness/exacerbation are also affecting patient's functional outcome.   REHAB POTENTIAL: Good  CLINICAL DECISION MAKING: Stable/uncomplicated  EVALUATION COMPLEXITY: Low   GOALS: Goals reviewed with patient? No  SHORT TERM GOALS: Target date: 07/19/2021  Pt will be independent with HEP in order to improve strength and balance in order to decrease fall risk and improve function at home and work. Baseline: NT Goal status: INITIAL  2.  Patient will improve quad flexibility as evidenced by negative Ely's test for improved knee joint mechanics for improved transfers and ambulation.  Baseline: + Bilateral Ely's Test Goal status: INITIAL    LONG TERM GOALS: Target date: 08/30/2021   Patient will have improved function and activity level as evidenced by an increase in FOTO score by 10 points or more.  Baseline: 56/64 Goal status: INITIAL  2.  Patient will  complete without right hip pain that is >=2/10 for increased aerobic capacity to return to long walks and prolonged weight bearing exercise required for gardening.  Baseline: NT  Goal status: INITIAL     PLAN: PT FREQUENCY: 1x/week  PT DURATION: 10 weeks  PLANNED INTERVENTIONS: Therapeutic exercises, Therapeutic activity, Neuromuscular re-education, Balance training, Gait training, Patient/Family education, Joint manipulation, Joint mobilization, Stair training, Vestibular training, Aquatic Therapy, Dry Needling, Electrical stimulation, Spinal manipulation, Spinal mobilization, Cryotherapy, Moist heat, Manual therapy, and Re-evaluation  PLAN FOR NEXT SESSION: 6mWT, Ober's Test, Progress hip strengthening exercises  Ellin Goodieaniel Kortne All PT, DPT  07/05/2021, 9:36 PM

## 2021-07-11 ENCOUNTER — Encounter: Payer: Medicare PPO | Admitting: Physical Therapy

## 2021-07-13 ENCOUNTER — Ambulatory Visit: Payer: Medicare PPO | Admitting: Physical Therapy

## 2021-07-13 ENCOUNTER — Encounter: Payer: Self-pay | Admitting: Physical Therapy

## 2021-07-13 DIAGNOSIS — M1611 Unilateral primary osteoarthritis, right hip: Secondary | ICD-10-CM

## 2021-07-13 DIAGNOSIS — M545 Low back pain, unspecified: Secondary | ICD-10-CM | POA: Diagnosis not present

## 2021-07-13 DIAGNOSIS — M25551 Pain in right hip: Secondary | ICD-10-CM

## 2021-07-13 NOTE — Therapy (Signed)
OUTPATIENT PHYSICAL THERAPY TREATMENT NOTE   Patient Name: Laura [name] MRN: [id number] DOB:1932-08-08, 88 y.o., female Today's Date: 07/13/2021  PCP: Dr. Darrick Huntsman REFERRING PROVIDER: Dr. Darrick Huntsman  END OF SESSION:   PT End of Session - 07/13/21 1334     Visit Number 2    Number of Visits 10    Date for PT Re-Evaluation 09/13/21    PT Start Time 1330    PT Stop Time 1415    PT Time Calculation (min) 45 min    Activity Tolerance Patient tolerated treatment well    Behavior During Therapy [facility] for tasks assessed/performed             Past Medical History:  Diagnosis Date   Accidental fall 10/16/2013   Anxiety    Carotid stenosis    Left    Fracture of rib 09/26/2019   High cholesterol    Hypertension    Pt states that her blood pressure is very well controlled   Migraine with aura    Neuropathy    Osteoporosis    Past Surgical History:  Procedure Laterality Date   ABDOMINAL HYSTERECTOMY     secondary to peritonitis and complications    APPENDECTOMY     TONSILLECTOMY     TUBAL LIGATION     at age 70, vaginal complicated by peritonitis   Patient Active Problem List   Diagnosis Date Noted   Hip pain, right 06/23/2021   Hemorrhoid 06/11/2021   History of rib fracture 01/07/2021   Cervical radiculopathy 01/07/2021   Low blood pressure reading 12/18/2020   COVID-19 virus infection 09/14/2020   Gait disorder 06/04/2020   Aortic atherosclerosis (HCC) 05/02/2020   Altered bowel function 02/03/2020   Dizziness 12/31/2018   Degenerative joint disease (DJD) of hip 11/29/2018   Bursitis of left hip 05/31/2018   Encounter for preventive health examination 07/31/2016   Lumbar radiculitis 12/03/2014   DDD (degenerative disc disease), lumbar 12/03/2014   Intracranial arachnoid cyst 10/25/2014   Low back pain 08/20/2014   Migraine aura without headache 04/24/2014   Encounter for Medicare annual wellness exam 11/01/2013   S/P hysterectomy with oophorectomy 01/03/2013    HTN (hypertension) 10/19/2012   B12 deficiency 04/16/2012   Anxiety state 04/16/2012   Generalized anxiety disorder 04/16/2012   Neuropathy of both feet 09/18/2011   Hyperlipidemia LDL goal <70 03/21/2011   Insomnia due to anxiety and fear 03/21/2011   Osteoporosis     REFERRING DIAG:   Pain in right hip   Primary osteoarthritis of right hip  THERAPY DIAG:  Pain in right hip  Primary osteoarthritis of right hip  Rationale for Evaluation and Treatment Rehabilitation  PERTINENT HISTORY: Per Dr. Melina Schools Note on 06/22/21   Hip pain, right - Primary                                Exam and histroy suggestive of trochanteric bursitis.  Plain films ordered.  Steroid taper, tramadol for moderate pain, and  PT ordered.   follow up one month                    Relevant Orders             DG Hip Unilat W OR W/O Pelvis 2-3 Views Right (Completed)             Ambulatory referral to Physical Therapy  Low back pain                          Reviewed plain films done at last visit with patient.  Back extension exercises demonstrated.  She was able to perform the exercise without aggravation of her low back pain .  Advised her to incorporate core strengthening exercises in her regular exercise routine and avoid lifting or laboring in a way that places undue weight bearing and stress on lower spine    PRECAUTIONS: None   SUBJECTIVE: Pt reports that most of her pain has still resolved with the exception of her right hip from time to time. She also experiences right knee pain while walking.   PAIN:  Are you having pain? Yes: NPRS scale: 3/10 Pain location: Lateral side of greater trochanter or right hip Pain description: achy Aggravating factors: Laying on right side  Relieving factors: Prednisone    OBJECTIVE: (objective measures completed at initial evaluation unless otherwise dated)  OBJECTIVE:                VITALS: BP 143/69 HR 72 SpO2 97   DIAGNOSTIC FINDINGS:     EXAM: DG HIP (WITH OR WITHOUT PELVIS) 2-3V RIGHT   COMPARISON:  Bilateral sacroiliac joint radiographs 06/09/2021; CT abdomen and pelvis 03/18/2020   FINDINGS: Severe joint space narrowing and peripheral osteophytosis degenerative change of the pubic symphysis. Mild bilateral femoroacetabular joint space narrowing. Mild bilateral sacroiliac joint subchondral sclerosis without joint space narrowing. No acute fracture or dislocation. Vascular phleboliths overlie the left hemipelvis.   IMPRESSION: 1. Mild bilateral femoroacetabular osteoarthritis. 2. Severe pubic symphysis osteoarthritis.     Electronically Signed   By: Neita Garnet M.D.   On: 06/23/2021 13:40   PATIENT SURVEYS:  FOTO 56/64   COGNITION:           Overall cognitive status: Within functional limits for tasks assessed                          SENSATION: WFL   EDEMA: None noted     MUSCLE LENGTH: Hamstrings: Right 90 deg; Left 90 deg Thomas test: - Bilateral    POSTURE: No Significant postural limitations   PALPATION: Lateral side of right hip over greater trochanter TTP   - Cauda Equina Syndrome: Negative to all symptoms  Bladder/bowel dysfunction Saddle anesthesia  Sexual dysfunction Possible neurological deficits in the lower limb (motor or sensory loss, reflex change)   MUSCULOSKELETAL:   AROM (in standing):         Lumbar flexion: 100%                ext: 100%                SB R/L: 100%                Rot R/L: 100%     LOWER EXTREMITY ROM:       Active  Right 07/05/2021 Left 07/05/2021  Hip flexion 120 120  Hip extension 30 30  Hip abduction 45 45  Hip adduction 30 30  Hip internal rotation 45 45  Hip external rotation 45 45  Knee flexion 125* 135  Knee extension 0 0  Ankle dorsiflexion 20 20  Ankle plantarflexion 50 50  Ankle inversion 35 35  Ankle eversion 15 15   (Blank rows = not tested)        LOWER  EXTREMITY MMT:   MMT Right eval Left eval  Hip flexion 5 5   Hip extension 5 5  Hip abduction 5 5  Hip adduction 4+ 4+  Hip internal rotation 5 5  Hip external rotation 5 5  Knee flexion 5 5  Knee extension 5 5  Ankle dorsiflexion      Ankle plantarflexion      Ankle inversion      Ankle eversion       (Blank rows = not tested)   LOWER EXTREMITY SPECIAL TESTS:  Hip special tests: Luisa Hart (FABER) test: positive , Trendelenburg test: positive , Thomas test: negative, Craig's test: negative, Ely's test: positive , and SI compression test: negative, FADIR + R   FUNCTIONAL TESTS:  Squat- Anterior translation of knees with no varus or valgus moments    GAIT: Distance walked: 25 ft Assistive device utilized: None Level of assistance: Complete Independence Comments: No gait abnormalities noted       TODAY'S TREATMENT:  07/13/21: Keep lights dim            Nu-Step seat level 8 and arm 8 with level 3 resistance 5 minutes            : 1,700 ft             -NPS 4/10 in knee                  Side Lying  Hip Abduction with Yellow TB            Min VC to slow down eccentric portion of exercise          Ober's: Negative on RLE               Supine Bridges 1 x 10               Figure 4 Supine Bridges 2 x 10                 Standing Hip Adductor Lunge 2 x 60 sec          Supine Hip Adductor Stretch 2 x 60 sec   Initial Eval:   Prone Quad Stretch 2 x 30 sec  -Slight pain noted in right knee  Side Lying Hip Adduction 2 x 10    PATIENT EDUCATION:  Education details: form and technique for appropriate exercise and description of underlying pathology  Person educated: Patient Education method: Explanation, Demonstration, Verbal cues, and Handouts Education comprehension: verbalized understanding, returned demonstration, tactile cues required, and needs further education     HOME EXERCISE PROGRAM: Access Code: [email or web address] URL: [email or web address] Date: 07/13/2021 Prepared by: Ellin Goodie  Exercises - Prone  Quadriceps Stretch with Strap  - 1 x daily - 7 x weekly - 1 sets - 3 reps - 60 hold - Sidelying Hip Adduction  - 1 x daily - 3 x weekly - 3 sets - 10 reps - Side Lunge Adductor Stretch  - 1 x daily - 7 x weekly - 1 sets - 3 reps - 60 hold - Supine Hip Adductor Stretch  - 1 x daily - 7 x weekly - 1 sets - 3 reps - 60 hold - Figure 4 Bridge  - 1 x daily - 3 x weekly - 3 sets - 10 reps - Sidelying Hip Abduction  - 1 x daily - 3 x weekly - 3 sets - 10 reps   ASSESSMENT:   CLINICAL IMPRESSION:  Pt exhibits signs and  symptoms of trochonteric bursitis on right hip with pain localized over the right trochanter despite ruling out last session. She did show a reduction in pain from pre and post session. distance indicates that pt's pain is not limiting her functional ability at least within context of aerobic endurance. She will continue to benefit from PT to improve hip strength, flexibility, and ROM in order to return to bending for gardening and walking for prolonged distances.     OBJECTIVE IMPAIRMENTS decreased ROM, decreased strength, and pain.    ACTIVITY LIMITATIONS bending, sitting, standing, squatting, sleeping, stairs, and gardening   PARTICIPATION LIMITATIONS: shopping, community activity, and yard work   PERSONAL FACTORS Age, Past/current experiences, and Time since onset of injury/illness/exacerbation are also affecting patient's functional outcome.    REHAB POTENTIAL: Good   CLINICAL DECISION MAKING: Stable/uncomplicated   EVALUATION COMPLEXITY: Low     GOALS: Goals reviewed with patient? No   SHORT TERM GOALS: Target date: 07/19/2021  Pt will be independent with HEP in order to improve strength and balance in order to decrease fall risk and improve function at home and work. Baseline: NT Goal status: INITIAL   2.  Patient will improve quad flexibility as evidenced by negative Ely's test for improved knee joint mechanics for improved transfers and ambulation.  Baseline:  + Bilateral Ely's Test Goal status: INITIAL       LONG TERM GOALS: Target date: 08/30/2021    Patient will have improved function and activity level as evidenced by an increase in FOTO score by 10 points or more.  Baseline: 56/64 Goal status: INITIAL   2.  Patient will complete without right hip pain that is >=2/10 for increased aerobic capacity to return to long walks and prolonged weight bearing exercise required for gardening.  Baseline: NT  Goal status: INITIAL         PLAN: PT FREQUENCY: 1x/week   PT DURATION: 10 weeks   PLANNED INTERVENTIONS: Therapeutic exercises, Therapeutic activity, Neuromuscular re-education, Balance training, Gait training, Patient/Family education, Joint manipulation, Joint mobilization, Stair training, Vestibular training, Aquatic Therapy, Dry Needling, Electrical stimulation, Spinal manipulation, Spinal mobilization, Cryotherapy, Moist heat, Manual therapy, and Re-evaluation   PLAN FOR NEXT SESSION:  Progress hip strengthening exercises, SLS, and standing hip strengthening exercises    Ellin Goodie PT, DPT  07/13/2021, 1:35 PM

## 2021-07-17 ENCOUNTER — Encounter: Payer: Medicare PPO | Admitting: Physical Therapy

## 2021-07-19 ENCOUNTER — Encounter: Payer: Self-pay | Admitting: Physical Therapy

## 2021-07-19 ENCOUNTER — Ambulatory Visit: Payer: Medicare PPO | Admitting: Physical Therapy

## 2021-07-19 DIAGNOSIS — M1611 Unilateral primary osteoarthritis, right hip: Secondary | ICD-10-CM

## 2021-07-19 DIAGNOSIS — M25551 Pain in right hip: Secondary | ICD-10-CM

## 2021-07-19 DIAGNOSIS — M545 Low back pain, unspecified: Secondary | ICD-10-CM | POA: Diagnosis not present

## 2021-07-19 NOTE — Therapy (Signed)
OUTPATIENT PHYSICAL THERAPY TREATMENT NOTE   Patient Name: Laura [name] MRN: [id number] DOB:28-May-1932, 88 y.o., female Today's Date: 07/19/2021  PCP: Dr. Darrick Huntsman REFERRING PROVIDER: Dr. Darrick Huntsman  END OF SESSION:   PT End of Session - 07/19/21 1109     Visit Number 3    Number of Visits 10    Date for PT Re-Evaluation 09/13/21    PT Start Time 1105    PT Stop Time 1145    PT Time Calculation (min) 40 min    Activity Tolerance Patient tolerated treatment well    Behavior During Therapy [facility] for tasks assessed/performed             Past Medical History:  Diagnosis Date   Accidental fall 10/16/2013   Anxiety    Carotid stenosis    Left    Fracture of rib 09/26/2019   High cholesterol    Hypertension    Pt states that her blood pressure is very well controlled   Migraine with aura    Neuropathy    Osteoporosis    Past Surgical History:  Procedure Laterality Date   ABDOMINAL HYSTERECTOMY     secondary to peritonitis and complications    APPENDECTOMY     TONSILLECTOMY     TUBAL LIGATION     at age 53, vaginal complicated by peritonitis   Patient Active Problem List   Diagnosis Date Noted   Hip pain, right 06/23/2021   Hemorrhoid 06/11/2021   History of rib fracture 01/07/2021   Cervical radiculopathy 01/07/2021   Low blood pressure reading 12/18/2020   COVID-19 virus infection 09/14/2020   Gait disorder 06/04/2020   Aortic atherosclerosis (HCC) 05/02/2020   Altered bowel function 02/03/2020   Dizziness 12/31/2018   Degenerative joint disease (DJD) of hip 11/29/2018   Bursitis of left hip 05/31/2018   Encounter for preventive health examination 07/31/2016   Lumbar radiculitis 12/03/2014   DDD (degenerative disc disease), lumbar 12/03/2014   Intracranial arachnoid cyst 10/25/2014   Low back pain 08/20/2014   Migraine aura without headache 04/24/2014   Encounter for Medicare annual wellness exam 11/01/2013   S/P hysterectomy with oophorectomy 01/03/2013    HTN (hypertension) 10/19/2012   B12 deficiency 04/16/2012   Anxiety state 04/16/2012   Generalized anxiety disorder 04/16/2012   Neuropathy of both feet 09/18/2011   Hyperlipidemia LDL goal <70 03/21/2011   Insomnia due to anxiety and fear 03/21/2011   Osteoporosis     REFERRING DIAG:   Pain in right hip   Primary osteoarthritis of right hip  THERAPY DIAG:  Pain in right hip  Primary osteoarthritis of right hip  Rationale for Evaluation and Treatment Rehabilitation  PERTINENT HISTORY: Per Dr. Melina Schools Note on 06/22/21   Hip pain, right - Primary                                Exam and histroy suggestive of trochanteric bursitis.  Plain films ordered.  Steroid taper, tramadol for moderate pain, and  PT ordered.   follow up one month                    Relevant Orders             DG Hip Unilat W OR W/O Pelvis 2-3 Views Right (Completed)             Ambulatory referral to Physical Therapy  Low back pain                          Reviewed plain films done at last visit with patient.  Back extension exercises demonstrated.  She was able to perform the exercise without aggravation of her low back pain .  Advised her to incorporate core strengthening exercises in her regular exercise routine and avoid lifting or laboring in a way that places undue weight bearing and stress on lower spine    PRECAUTIONS: None   SUBJECTIVE: Pt reports no pain during the start of today's session. She has been able to do all exercises with the exception of the ones with TB because of the feel of the texture.   PAIN:  Are you having pain? No   OBJECTIVE: (objective measures completed at initial evaluation unless otherwise dated)  OBJECTIVE:                VITALS: BP 143/69 HR 72 SpO2 97   DIAGNOSTIC FINDINGS:    EXAM: DG HIP (WITH OR WITHOUT PELVIS) 2-3V RIGHT   COMPARISON:  Bilateral sacroiliac joint radiographs 06/09/2021; CT abdomen and pelvis 03/18/2020   FINDINGS: Severe  joint space narrowing and peripheral osteophytosis degenerative change of the pubic symphysis. Mild bilateral femoroacetabular joint space narrowing. Mild bilateral sacroiliac joint subchondral sclerosis without joint space narrowing. No acute fracture or dislocation. Vascular phleboliths overlie the left hemipelvis.   IMPRESSION: 1. Mild bilateral femoroacetabular osteoarthritis. 2. Severe pubic symphysis osteoarthritis.     Electronically Signed   By: Neita Garnet M.D.   On: 06/23/2021 13:40   PATIENT SURVEYS:  FOTO 61/64   COGNITION:           Overall cognitive status: Within functional limits for tasks assessed                          SENSATION: WFL   EDEMA: None noted     MUSCLE LENGTH: Hamstrings: Right 90 deg; Left 90 deg Thomas test: - Bilateral    POSTURE: No Significant postural limitations   PALPATION: Lateral side of right hip over greater trochanter TTP   - Cauda Equina Syndrome: Negative to all symptoms  Bladder/bowel dysfunction Saddle anesthesia  Sexual dysfunction Possible neurological deficits in the lower limb (motor or sensory loss, reflex change)   MUSCULOSKELETAL:   AROM (in standing):         Lumbar flexion: 100%                ext: 100%                SB R/L: 100%                Rot R/L: 100%     LOWER EXTREMITY ROM:       Active  Right 07/05/2021 Left 07/05/2021  Hip flexion 120 120  Hip extension 30 30  Hip abduction 45 45  Hip adduction 30 30  Hip internal rotation 45 45  Hip external rotation 45 45  Knee flexion 125* 135  Knee extension 0 0  Ankle dorsiflexion 20 20  Ankle plantarflexion 50 50  Ankle inversion 35 35  Ankle eversion 15 15   (Blank rows = not tested)        LOWER EXTREMITY MMT:   MMT Right eval Left eval  Hip flexion 5 5  Hip extension 5 5  Hip abduction 5  5  Hip adduction 4+ 4+  Hip internal rotation 5 5  Hip external rotation 5 5  Knee flexion 5 5  Knee extension 5 5  Ankle  dorsiflexion      Ankle plantarflexion      Ankle inversion      Ankle eversion       (Blank rows = not tested)   LOWER EXTREMITY SPECIAL TESTS:  Hip special tests: Luisa Hart (FABER) test: positive , Trendelenburg test: positive , Thomas test: negative, Craig's test: negative, Ely's test: positive , and SI compression test: negative, FADIR + R   FUNCTIONAL TESTS:  Squat- Anterior translation of knees with no varus or valgus moments    GAIT: Distance walked: 25 ft Assistive device utilized: None Level of assistance: Complete Independence Comments: No gait abnormalities noted       TODAY'S TREATMENT:  07/19/21 Keep Lights Dim   Nu-Step seat level 8 and arm 8 with level 3 resistance 5 minutes    Side Lying Hip Abduction on RLE with # 3 AW 3 x 10    SLS 2 x 10 sec  -Unable to maintain single leg balance   SLS with three cone tap 1 x 10  -Difficulty with performing; needs to self-terminate   Standing Heel and Toe Raises 3 x 10  -some exaggeration to catch balance with toe raises   07/13/21: Keep lights dim            Nu-Step seat level 8 and arm 8 with level 3 resistance 5 minutes            : 1,700 ft             -NPS 4/10 in knee                  Side Lying  Hip Abduction with Yellow TB            Min VC to slow down eccentric portion of exercise          Ober's: Negative on RLE               Supine Bridges 1 x 10               Figure 4 Supine Bridges 2 x 10                 Standing Hip Adductor Lunge 2 x 60 sec          Supine Hip Adductor Stretch 2 x 60 sec   Initial Eval:   Prone Quad Stretch 2 x 30 sec  -Slight pain noted in right knee  Side Lying Hip Adduction 2 x 10    PATIENT EDUCATION:  Education details: form and technique for appropriate exercise and description of underlying pathology  Person educated: Patient Education method: Explanation, Demonstration, Verbal cues, and Handouts Education comprehension: verbalized understanding, returned  demonstration, tactile cues required, and needs further education     HOME EXERCISE PROGRAM: Access Code: [email or web address] URL: [email or web address] Date: 07/19/2021 Prepared by: Ellin Goodie  Exercises - Prone Quadriceps Stretch with Strap  - 1 x daily - 7 x weekly - 1 sets - 3 reps - 60 hold - Sidelying Hip Adduction  - 1 x daily - 3 x weekly - 3 sets - 10 reps - Side Lunge Adductor Stretch  - 1 x daily - 7 x weekly - 1 sets - 3 reps - 60 hold - Supine Hip Adductor Stretch  - 1  x daily - 7 x weekly - 1 sets - 3 reps - 60 hold - Figure 4 Bridge  - 1 x daily - 3 x weekly - 3 sets - 10 reps - Sidelying Hip Abduction  - 1 x daily - 3 x weekly - 3 sets - 10 reps - Heel Toe Raises with Counter Support  - 1 x daily - 3 x weekly - 3 sets - 10 reps   ASSESSMENT:   CLINICAL IMPRESSION:  Pt exhibits improvement with right hip response to activity with ability to complete all exercises during session without an increase in her pain. She continues to demonstrate decreased hip strength and static balance with inability to maintain single leg stance and perform multiple cone taps. If pt continues to show improved pain response to activity, then will likely discharge from PT next session. She will continue to benefit from PT to improve hip strength, flexibility, and ROM in order to return to bending for gardening and walking for prolonged distances.    OBJECTIVE IMPAIRMENTS decreased ROM, decreased strength, and pain.    ACTIVITY LIMITATIONS bending, sitting, standing, squatting, sleeping, stairs, and gardening   PARTICIPATION LIMITATIONS: shopping, community activity, and yard work   PERSONAL FACTORS Age, Past/current experiences, and Time since onset of injury/illness/exacerbation are also affecting patient's functional outcome.    REHAB POTENTIAL: Good   CLINICAL DECISION MAKING: Stable/uncomplicated   EVALUATION COMPLEXITY: Low     GOALS: Goals reviewed with patient? No    SHORT TERM GOALS: Target date: 07/19/2021  Pt will be independent with HEP in order to improve strength and balance in order to decrease fall risk and improve function at home and work. Baseline: NT Goal status: INITIAL   2.  Patient will improve quad flexibility as evidenced by negative Ely's test for improved knee joint mechanics for improved transfers and ambulation.  Baseline: + Bilateral Ely's Test Goal status: INITIAL       LONG TERM GOALS: Target date: 08/30/2021    Patient will have improved function and activity level as evidenced by an increase in FOTO score by 10 points or more.  Baseline: 56/64  07/19/21: 61 Goal status: INITIAL   2.  Patient will complete without right hip pain that is >=2/10 for increased aerobic capacity to return to long walks and prolonged weight bearing exercise required for gardening.  Baseline: 1,700 ft  Goal status: DEFERRED          PLAN: PT FREQUENCY: 1x/week   PT DURATION: 10 weeks   PLANNED INTERVENTIONS: Therapeutic exercises, Therapeutic activity, Neuromuscular re-education, Balance training, Gait training, Patient/Family education, Joint manipulation, Joint mobilization, Stair training, Vestibular training, Aquatic Therapy, Dry Needling, Electrical stimulation, Spinal manipulation, Spinal mobilization, Cryotherapy, Moist heat, Manual therapy, and Re-evaluation   PLAN FOR NEXT SESSION:  Reassess goals. Progress hip strengthening exercises, SLS, and standing hip strengthening exercises    Ellin Goodie PT, DPT  07/19/2021, 12:07 PM

## 2021-07-25 ENCOUNTER — Encounter: Payer: Medicare PPO | Admitting: Physical Therapy

## 2021-07-27 ENCOUNTER — Ambulatory Visit: Payer: Medicare PPO | Admitting: Physical Therapy

## 2021-07-27 ENCOUNTER — Encounter: Payer: Self-pay | Admitting: Physical Therapy

## 2021-07-27 DIAGNOSIS — M1611 Unilateral primary osteoarthritis, right hip: Secondary | ICD-10-CM | POA: Diagnosis not present

## 2021-07-27 DIAGNOSIS — M25551 Pain in right hip: Secondary | ICD-10-CM

## 2021-07-27 DIAGNOSIS — M545 Low back pain, unspecified: Secondary | ICD-10-CM | POA: Diagnosis not present

## 2021-07-27 NOTE — Therapy (Addendum)
OUTPATIENT PHYSICAL THERAPY DISCHARGE SUMMARY    Patient Name: Laura [name] MRN: [id number] DOB:Jun 06, 1932, 88 y.o., female Today's Date: 07/27/2021  PCP: Dr. Derrel Nip REFERRING PROVIDER: Dr. Derrel Nip  END OF SESSION:   PT End of Session - 07/27/21 1335     Visit Number 4    Number of Visits 10    Date for PT Re-Evaluation 09/13/21    PT Start Time 1330    PT Stop Time 1400    PT Time Calculation (min) 30 min    Activity Tolerance Patient tolerated treatment well    Behavior During Therapy [facility] for tasks assessed/performed             Past Medical History:  Diagnosis Date   Accidental fall 10/16/2013   Anxiety    Carotid stenosis    Left    Fracture of rib 09/26/2019   High cholesterol    Hypertension    Pt states that her blood pressure is very well controlled   Migraine with aura    Neuropathy    Osteoporosis    Past Surgical History:  Procedure Laterality Date   ABDOMINAL HYSTERECTOMY     secondary to peritonitis and complications    APPENDECTOMY     TONSILLECTOMY     TUBAL LIGATION     at age 49, vaginal complicated by peritonitis   Patient Active Problem List   Diagnosis Date Noted   Hip pain, right 06/23/2021   Hemorrhoid 06/11/2021   History of rib fracture 01/07/2021   Cervical radiculopathy 01/07/2021   Low blood pressure reading 12/18/2020   COVID-19 virus infection 09/14/2020   Gait disorder 06/04/2020   Aortic atherosclerosis ([place]) 05/02/2020   Altered bowel function 02/03/2020   Dizziness 12/31/2018   Degenerative joint disease (DJD) of hip 11/29/2018   Bursitis of left hip 05/31/2018   Encounter for preventive health examination 07/31/2016   Lumbar radiculitis 12/03/2014   DDD (degenerative disc disease), lumbar 12/03/2014   Intracranial arachnoid cyst 10/25/2014   Low back pain 08/20/2014   Migraine aura without headache 04/24/2014   Encounter for Medicare annual wellness exam 11/01/2013   S/P hysterectomy with oophorectomy 01/03/2013    HTN (hypertension) 10/19/2012   B12 deficiency 04/16/2012   Anxiety state 04/16/2012   Generalized anxiety disorder 04/16/2012   Neuropathy of both feet 09/18/2011   Hyperlipidemia LDL goal <70 03/21/2011   Insomnia due to anxiety and fear 03/21/2011   Osteoporosis     REFERRING DIAG:   Pain in right hip   Primary osteoarthritis of right hip  THERAPY DIAG:  Pain in right hip  Primary osteoarthritis of right hip  Rationale for Evaluation and Treatment Rehabilitation  PERTINENT HISTORY: Per Dr. Lupita Dawn Note on 06/22/21   Hip pain, right - Primary                                Exam and histroy suggestive of trochanteric bursitis.  Plain films ordered.  Steroid taper, tramadol for moderate pain, and  PT ordered.   follow up one month                    Relevant Orders             DG Hip Unilat W OR W/O Pelvis 2-3 Views Right (Completed)             Ambulatory referral to Physical Therapy  Low back pain                          Reviewed plain films done at last visit with patient.  Back extension exercises demonstrated.  She was able to perform the exercise without aggravation of her low back pain .  Advised her to incorporate core strengthening exercises in her regular exercise routine and avoid lifting or laboring in a way that places undue weight bearing and stress on lower spine    PRECAUTIONS: None   SUBJECTIVE: Pt continues to report no hip pain and that she has been doing most of the exercises with the exception of one with ankle weights because she has not been able to purchase them.   PAIN:  Are you having pain? No   OBJECTIVE: (objective measures completed at initial evaluation unless otherwise dated)  OBJECTIVE:                VITALS: BP 143/69 HR 72 SpO2 97   DIAGNOSTIC FINDINGS:    EXAM: DG HIP (WITH OR WITHOUT PELVIS) 2-3V RIGHT   COMPARISON:  Bilateral sacroiliac joint radiographs 06/09/2021; CT abdomen and pelvis 03/18/2020    FINDINGS: Severe joint space narrowing and peripheral osteophytosis degenerative change of the pubic symphysis. Mild bilateral femoroacetabular joint space narrowing. Mild bilateral sacroiliac joint subchondral sclerosis without joint space narrowing. No acute fracture or dislocation. Vascular phleboliths overlie the left hemipelvis.   IMPRESSION: 1. Mild bilateral femoroacetabular osteoarthritis. 2. Severe pubic symphysis osteoarthritis.     Electronically Signed   By: Yvonne Kendall M.D.   On: 06/23/2021 13:40   PATIENT SURVEYS:  FOTO 61/64   COGNITION:           Overall cognitive status: Within functional limits for tasks assessed                          SENSATION: WFL   EDEMA: None noted     MUSCLE LENGTH: Hamstrings: Right 90 deg; Left 90 deg Thomas test: - Bilateral    POSTURE: No Significant postural limitations   PALPATION: Lateral side of right hip over greater trochanter TTP   - Cauda Equina Syndrome: Negative to all symptoms  Bladder/bowel dysfunction Saddle anesthesia  Sexual dysfunction Possible neurological deficits in the lower limb (motor or sensory loss, reflex change)   MUSCULOSKELETAL:   AROM (in standing):         Lumbar flexion: 100%                ext: 100%                SB R/L: 100%                Rot R/L: 100%     LOWER EXTREMITY ROM:       Active  Right 07/05/2021 Left 07/05/2021  Hip flexion 120 120  Hip extension 30 30  Hip abduction 45 45  Hip adduction 30 30  Hip internal rotation 45 45  Hip external rotation 45 45  Knee flexion 125* 135  Knee extension 0 0  Ankle dorsiflexion 20 20  Ankle plantarflexion 50 50  Ankle inversion 35 35  Ankle eversion 15 15   (Blank rows = not tested)        LOWER EXTREMITY MMT:   MMT Right eval Left eval  Hip flexion 5 5  Hip extension 5 5  Hip abduction  5 5  Hip adduction 4+ 4+  Hip internal rotation 5 5  Hip external rotation 5 5  Knee flexion 5 5  Knee extension 5  5  Ankle dorsiflexion      Ankle plantarflexion      Ankle inversion      Ankle eversion       (Blank rows = not tested)   LOWER EXTREMITY SPECIAL TESTS:  Hip special tests: Saralyn Pilar (FABER) test: positive , Trendelenburg test: positive , Thomas test: negative, Craig's test: negative, Ely's test: positive , and SI compression test: negative, FADIR + R   FUNCTIONAL TESTS:  Squat- Anterior translation of knees with no varus or valgus moments    GAIT: Distance walked: 25 ft Assistive device utilized: None Level of assistance: Complete Independence Comments: No gait abnormalities noted       TODAY'S TREATMENT:  07/27/21  Nu-Step seat and arm level 8 with level 3 resistance 5 min FOTO 79  Ely's Test: + R  Reviewed HEP exercises and added single leg stance for hip strengthening and balance    07/19/21 Keep Lights Dim   Nu-Step seat level 8 and arm 8 with level 3 resistance 5 minutes    Side Lying Hip Abduction on RLE with # 3 AW 3 x 10    SLS 2 x 10 sec  -Unable to maintain single leg balance   SLS with three cone tap 1 x 10  -Difficulty with performing; needs to self-terminate   Standing Heel and Toe Raises 3 x 10  -some exaggeration to catch balance with toe raises   07/13/21: Keep lights dim            Nu-Step seat level 8 and arm 8 with level 3 resistance 5 minutes            59mT: 1,700 ft             -NPS 4/10 in knee                  Side Lying  Hip Abduction with Yellow TB            Min VC to slow down eccentric portion of exercise          Ober's: Negative on RLE               Supine Bridges 1 x 10               Figure 4 Supine Bridges 2 x 10                 Standing Hip Adductor Lunge 2 x 60 sec          Supine Hip Adductor Stretch 2 x 60 sec   Initial Eval:   Prone Quad Stretch 2 x 30 sec  -Slight pain noted in right knee  Side Lying Hip Adduction 2 x 10    PATIENT EDUCATION:  Education details: form and technique for appropriate exercise and  description of underlying pathology  Person educated: Patient Education method: Explanation, Demonstration, Verbal cues, and Handouts Education comprehension: verbalized understanding, returned demonstration, tactile cues required, and needs further education     HOME EXERCISE PROGRAM: Access Code: [email or web address]: [email or web address] Date: 07/27/2021 Prepared by: DBradly Chris Exercises - Prone Quadriceps Stretch with Strap  - 1 x daily - 7 x weekly - 1 sets - 3 reps - 60 hold - Sidelying Hip Adduction  - 1 x daily - 3  x weekly - 3 sets - 10 reps - Side Lunge Adductor Stretch  - 1 x daily - 7 x weekly - 1 sets - 3 reps - 60 hold - Supine Hip Adductor Stretch  - 1 x daily - 7 x weekly - 1 sets - 3 reps - 60 hold - Figure 4 Bridge  - 1 x daily - 3 x weekly - 3 sets - 10 reps - Sidelying Hip Abduction  - 1 x daily - 3 x weekly - 3 sets - 10 reps - Heel Toe Raises with Counter Support  - 1 x daily - 3 x weekly - 3 sets - 10 reps - Single Leg Stance  - 1 x daily - 3 x weekly - 1 sets - 5 reps - 10 hold   ASSESSMENT:   CLINICAL IMPRESSION:  Pt continues to exhibit an improvement in pain response to activity with no hip pain. She has met all her goals and she has an improved self-perception of her function. Pt still has increased hip flexor tightness on RLE. She can perform exercises independently and she is ready for discharge from PT.    OBJECTIVE IMPAIRMENTS decreased ROM, decreased strength, and pain.    ACTIVITY LIMITATIONS bending, sitting, standing, squatting, sleeping, stairs, and gardening   PARTICIPATION LIMITATIONS: shopping, community activity, and yard work   PERSONAL FACTORS Age, Past/current experiences, and Time since onset of injury/illness/exacerbation are also affecting patient's functional outcome.    REHAB POTENTIAL: Good   CLINICAL DECISION MAKING: Stable/uncomplicated   EVALUATION COMPLEXITY: Low     GOALS: Goals reviewed with patient? No    SHORT TERM GOALS: Target date: 07/19/2021  Pt will be independent with HEP in order to improve strength and balance in order to decrease fall risk and improve function at home and work. Baseline: Able to perform independently  Goal status: ACHIEVED    2.  Patient will improve quad flexibility as evidenced by negative Ely's test for improved knee joint mechanics for improved transfers and ambulation.  Baseline: + R Bilateral Ely's Test Goal status: PARTIALLY ACHIEVED        LONG TERM GOALS: Target date: 08/30/2021    Patient will have improved function and activity level as evidenced by an increase in FOTO score by 10 points or more.  Baseline: 56/64  07/19/21: 61 07/27/21: 79/64 Goal status: ACHIEVED    2.  Patient will complete 34mT without right hip pain that is >=2/10 for increased aerobic capacity to return to long walks and prolonged weight bearing exercise required for gardening.  Baseline: 1,700 ft  Goal status: DEFERRED          PLAN: PT FREQUENCY: 1x/week   PT DURATION: 10 weeks   PLANNED INTERVENTIONS: Therapeutic exercises, Therapeutic activity, Neuromuscular re-education, Balance training, Gait training, Patient/Family education, Joint manipulation, Joint mobilization, Stair training, Vestibular training, Aquatic Therapy, Dry Needling, Electrical stimulation, Spinal manipulation, Spinal mobilization, Cryotherapy, Moist heat, Manual therapy, and Re-evaluation   PLAN FOR NEXT SESSION:  N/a discharge from PT    DBradly ChrisPT, DPT  07/27/2021, 1:42 PM

## 2021-07-30 ENCOUNTER — Other Ambulatory Visit: Payer: Self-pay | Admitting: Internal Medicine

## 2021-07-31 ENCOUNTER — Encounter: Payer: Medicare PPO | Admitting: Physical Therapy

## 2021-07-31 NOTE — Telephone Encounter (Signed)
Refilled: 02/01/2021 Last OV: 06/09/2021 Next OV: not scheduled.

## 2021-08-02 ENCOUNTER — Encounter: Payer: Medicare PPO | Admitting: Physical Therapy

## 2021-08-03 ENCOUNTER — Encounter: Payer: Medicare PPO | Admitting: Physical Therapy

## 2021-08-07 ENCOUNTER — Encounter: Payer: Medicare PPO | Admitting: Physical Therapy

## 2021-08-09 ENCOUNTER — Encounter: Payer: Medicare PPO | Admitting: Physical Therapy

## 2021-08-10 ENCOUNTER — Encounter: Payer: Medicare PPO | Admitting: Physical Therapy

## 2021-08-14 ENCOUNTER — Encounter: Payer: Medicare PPO | Admitting: Physical Therapy

## 2021-08-16 ENCOUNTER — Encounter: Payer: Medicare PPO | Admitting: Physical Therapy

## 2021-08-21 ENCOUNTER — Encounter: Payer: Medicare PPO | Admitting: Physical Therapy

## 2021-08-23 ENCOUNTER — Encounter: Payer: Medicare PPO | Admitting: Physical Therapy

## 2021-08-28 ENCOUNTER — Encounter: Payer: Medicare PPO | Admitting: Physical Therapy

## 2021-08-31 ENCOUNTER — Encounter: Payer: Medicare PPO | Admitting: Physical Therapy

## 2021-09-01 ENCOUNTER — Other Ambulatory Visit: Payer: Self-pay | Admitting: Internal Medicine

## 2021-09-04 ENCOUNTER — Encounter: Payer: Medicare PPO | Admitting: Physical Therapy

## 2021-09-07 ENCOUNTER — Encounter: Payer: Medicare PPO | Admitting: Physical Therapy

## 2021-09-12 ENCOUNTER — Encounter: Payer: Medicare PPO | Admitting: Physical Therapy

## 2021-09-14 ENCOUNTER — Encounter: Payer: Medicare PPO | Admitting: Physical Therapy

## 2021-09-19 ENCOUNTER — Encounter: Payer: Medicare PPO | Admitting: Physical Therapy

## 2021-09-21 ENCOUNTER — Encounter: Payer: Medicare PPO | Admitting: Physical Therapy

## 2021-09-25 ENCOUNTER — Encounter: Payer: Medicare PPO | Admitting: Physical Therapy

## 2021-09-28 ENCOUNTER — Encounter: Payer: Medicare PPO | Admitting: Physical Therapy

## 2021-10-11 DIAGNOSIS — L821 Other seborrheic keratosis: Secondary | ICD-10-CM | POA: Diagnosis not present

## 2021-10-11 DIAGNOSIS — D2261 Melanocytic nevi of right upper limb, including shoulder: Secondary | ICD-10-CM | POA: Diagnosis not present

## 2021-10-11 DIAGNOSIS — D2272 Melanocytic nevi of left lower limb, including hip: Secondary | ICD-10-CM | POA: Diagnosis not present

## 2021-10-11 DIAGNOSIS — D2271 Melanocytic nevi of right lower limb, including hip: Secondary | ICD-10-CM | POA: Diagnosis not present

## 2021-10-11 DIAGNOSIS — D2262 Melanocytic nevi of left upper limb, including shoulder: Secondary | ICD-10-CM | POA: Diagnosis not present

## 2021-10-11 DIAGNOSIS — D225 Melanocytic nevi of trunk: Secondary | ICD-10-CM | POA: Diagnosis not present

## 2021-10-28 ENCOUNTER — Encounter: Payer: Self-pay | Admitting: Internal Medicine

## 2021-11-21 ENCOUNTER — Ambulatory Visit (INDEPENDENT_AMBULATORY_CARE_PROVIDER_SITE_OTHER): Payer: Medicare PPO

## 2021-11-21 DIAGNOSIS — Z23 Encounter for immunization: Secondary | ICD-10-CM | POA: Diagnosis not present

## 2021-11-24 ENCOUNTER — Ambulatory Visit (INDEPENDENT_AMBULATORY_CARE_PROVIDER_SITE_OTHER): Payer: Medicare PPO

## 2021-11-24 VITALS — Ht 64.0 in | Wt 142.0 lb

## 2021-11-24 DIAGNOSIS — Z0000 Encounter for general adult medical examination without abnormal findings: Secondary | ICD-10-CM

## 2021-11-24 NOTE — Patient Instructions (Addendum)
Laura [name] , Thank you for taking time to come for your Medicare Wellness Visit. I appreciate your ongoing commitment to your health goals. Please review the following plan we discussed and let me know if I can assist you in the future.   These are the goals we discussed:  Goals       Patient Stated     DIET - REDUCE SUGAR INTAKE (pt-stated)        This is a list of the screening recommended for you and due dates:  Health Maintenance  Topic Date Due   COVID-19 Vaccine (5 - Pfizer risk series) 12/10/2021*   Medicare Annual Wellness Visit  11/25/2022   Tetanus Vaccine  02/20/2023   Pneumonia Vaccine  Completed   Flu Shot  Completed   DEXA scan (bone density measurement)  Completed   Zoster (Shingles) Vaccine  Completed   HPV Vaccine  Aged Out  *Topic was postponed. The date shown is not the original due date.    Advanced directives: End of life planning; Advance aging; Advanced directives discussed.  Copy of current HCPOA/Living Will requested.    Conditions/risks identified: none new  Next appointment: Follow up in one year for your annual wellness visit.   Preventive Care 86 Years and Older, Female Preventive care refers to lifestyle choices and visits with your health care provider that can promote health and wellness. What does preventive care include? A yearly physical exam. This is also called an annual well check. Dental exams once or twice a year. Routine eye exams. Ask your health care provider how often you should have your eyes checked. Personal lifestyle choices, including: Daily care of your teeth and gums. Regular physical activity. Eating a healthy diet. Avoiding tobacco and drug use. Limiting alcohol use. Practicing safe sex. Taking low-dose aspirin every day. Taking vitamin and mineral supplements as recommended by your health care provider. What happens during an annual well check? The services and screenings done by your health care provider during  your annual well check will depend on your age, overall health, lifestyle risk factors, and family history of disease. Counseling  Your health care provider may ask you questions about your: Alcohol use. Tobacco use. Drug use. Emotional well-being. Home and relationship well-being. Sexual activity. Eating habits. History of falls. Memory and ability to understand (cognition). Work and work Statistician. Reproductive health. Screening  You may have the following tests or measurements: Height, weight, and BMI. Blood pressure. Lipid and cholesterol levels. These may be checked every 5 years, or more frequently if you are over 94 years old. Skin check. Lung cancer screening. You may have this screening every year starting at age 27 if you have a 30-pack-year history of smoking and currently smoke or have quit within the past 15 years. Fecal occult blood test (FOBT) of the stool. You may have this test every year starting at age 32. Flexible sigmoidoscopy or colonoscopy. You may have a sigmoidoscopy every 5 years or a colonoscopy every 10 years starting at age 26. Hepatitis C blood test. Hepatitis B blood test. Sexually transmitted disease (STD) testing. Diabetes screening. This is done by checking your blood sugar (glucose) after you have not eaten for a while (fasting). You may have this done every 1-3 years. Bone density scan. This is done to screen for osteoporosis. You may have this done starting at age 7. Mammogram. This may be done every 1-2 years. Talk to your health care provider about how often you should have regular  mammograms. Talk with your health care provider about your test results, treatment options, and if necessary, the need for more tests. Vaccines  Your health care provider may recommend certain vaccines, such as: Influenza vaccine. This is recommended every year. Tetanus, diphtheria, and acellular pertussis (Tdap, Td) vaccine. You may need a Td booster every 10  years. Zoster vaccine. You may need this after age 18. Pneumococcal 13-valent conjugate (PCV13) vaccine. One dose is recommended after age 48. Pneumococcal polysaccharide (PPSV23) vaccine. One dose is recommended after age 52. Talk to your health care provider about which screenings and vaccines you need and how often you need them. This information is not intended to replace advice given to you by your health care provider. Make sure you discuss any questions you have with your health care provider. Document Released: 02/11/2015 Document Revised: 10/05/2015 Document Reviewed: 11/16/2014 Elsevier Interactive Patient Education  2017 [place] Prevention in the Home Falls can cause injuries. They can happen to people of all ages. There are many things you can do to make your home safe and to help prevent falls. What can I do on the outside of my home? Regularly fix the edges of walkways and driveways and fix any cracks. Remove anything that might make you trip as you walk through a door, such as a raised step or threshold. Trim any bushes or trees on the path to your home. Use bright outdoor lighting. Clear any walking paths of anything that might make someone trip, such as rocks or tools. Regularly check to see if handrails are loose or broken. Make sure that both sides of any steps have handrails. Any raised decks and porches should have guardrails on the edges. Have any leaves, snow, or ice cleared regularly. Use sand or salt on walking paths during winter. Clean up any spills in your garage right away. This includes oil or grease spills. What can I do in the bathroom? Use night lights. Install grab bars by the toilet and in the tub and shower. Do not use towel bars as grab bars. Use non-skid mats or decals in the tub or shower. If you need to sit down in the shower, use a plastic, non-slip stool. Keep the floor dry. Clean up any water that spills on the floor as soon as it  happens. Remove soap buildup in the tub or shower regularly. Attach bath mats securely with double-sided non-slip rug tape. Do not have throw rugs and other things on the floor that can make you trip. What can I do in the bedroom? Use night lights. Make sure that you have a light by your bed that is easy to reach. Do not use any sheets or blankets that are too big for your bed. They should not hang down onto the floor. Have a firm chair that has side arms. You can use this for support while you get dressed. Do not have throw rugs and other things on the floor that can make you trip. What can I do in the kitchen? Clean up any spills right away. Avoid walking on wet floors. Keep items that you use a lot in easy-to-reach places. If you need to reach something above you, use a strong step stool that has a grab bar. Keep electrical cords out of the way. Do not use floor polish or wax that makes floors slippery. If you must use wax, use non-skid floor wax. Do not have throw rugs and other things on the floor that can  make you trip. What can I do with my stairs? Do not leave any items on the stairs. Make sure that there are handrails on both sides of the stairs and use them. Fix handrails that are broken or loose. Make sure that handrails are as long as the stairways. Check any carpeting to make sure that it is firmly attached to the stairs. Fix any carpet that is loose or worn. Avoid having throw rugs at the top or bottom of the stairs. If you do have throw rugs, attach them to the floor with carpet tape. Make sure that you have a light switch at the top of the stairs and the bottom of the stairs. If you do not have them, ask someone to add them for you. What else can I do to help prevent falls? Wear shoes that: Do not have high heels. Have rubber bottoms. Are comfortable and fit you well. Are closed at the toe. Do not wear sandals. If you use a stepladder: Make sure that it is fully opened.  Do not climb a closed stepladder. Make sure that both sides of the stepladder are locked into place. Ask someone to hold it for you, if possible. Clearly mark and make sure that you can see: Any grab bars or handrails. First and last steps. Where the edge of each step is. Use tools that help you move around (mobility aids) if they are needed. These include: Canes. Walkers. Scooters. Crutches. Turn on the lights when you go into a dark area. Replace any light bulbs as soon as they burn out. Set up your furniture so you have a clear path. Avoid moving your furniture around. If any of your floors are uneven, fix them. If there are any pets around you, be aware of where they are. Review your medicines with your doctor. Some medicines can make you feel dizzy. This can increase your chance of falling. Ask your doctor what other things that you can do to help prevent falls. This information is not intended to replace advice given to you by your health care provider. Make sure you discuss any questions you have with your health care provider. Document Released: 11/11/2008 Document Revised: 06/23/2015 Document Reviewed: 02/19/2014 Elsevier Interactive Patient Education  2017 [employer].

## 2021-11-24 NOTE — Progress Notes (Cosign Needed Addendum)
Subjective:   Laura [name] is a 88 y.o. female who presents for Medicare Annual (Subsequent) preventive examination.  Review of Systems    No ROS.  Medicare Wellness Virtual Visit.  Visual/audio telehealth visit, UTA vital signs.   See social history for additional risk factors.   Cardiac Risk Factors include: advanced age (>57men, >76 women);hypertension     Objective:    Today's Vitals   11/24/21 0918  Weight: 142 lb (64.4 kg)  Height: 5\' 4"  (1.626 m)   Body mass index is 24.37 kg/m.     11/24/2021    9:29 AM 07/05/2021    4:34 PM 01/26/2021    4:00 PM 06/17/2020   12:25 PM 09/15/2019    1:15 PM 11/08/2017   12:02 PM 06/05/2016    9:18 AM  Advanced Directives  Does Patient Have a Medical Advance Directive? Yes Yes Yes Yes Yes Yes Yes  Type of Paramedic of [place];Living will Living will  [place];Living will [place];Living will [place];Living will [place];Living will  Does patient want to make changes to medical advance directive? No - Patient declined No - Patient declined No - Patient declined  No - Patient declined No - Patient declined No - Patient declined  Copy of [place] in Chart? No - copy requested    No - copy requested No - copy requested No - copy requested    Current Medications (verified) Outpatient Encounter Medications as of 11/24/2021  Medication Sig   ALPRAZolam (XANAX) 0.5 MG tablet Take 0.5 tablets (0.25 mg total) by mouth 2 (two) times daily as needed for anxiety or sleep. (Patient not taking: Reported on 06/22/2021)   amLODipine (NORVASC) 2.5 MG tablet TAKE 1 TABLET(2.5 MG) BY MOUTH DAILY   atorvastatin (LIPITOR) 20 MG tablet Take 1 tablet (20 mg total) by mouth daily.   calcium carbonate (OS-CAL) 600 MG TABS Take 600 mg by mouth 2 (two) times daily with a meal.   Cholecalciferol (VITAMIN D3) 50 MCG (2000 UT) CAPS Take 1  capsule by mouth daily.   Coenzyme Q10 (CO Q 10) 100 MG CAPS Take 1 capsule by mouth daily.   Cyanocobalamin 2500 MCG SUBL PLACE 1 TABLET UNDER THE TONGUE DAILY AS DIRECTED   denosumab (PROLIA) 60 MG/ML SOLN injection Inject 60 mg into the skin every 6 (six) months. Administer in upper arm, thigh, or abdomen   escitalopram (LEXAPRO) 10 MG tablet TAKE 1 TABLET(10 MG) BY MOUTH DAILY   magnesium oxide (MAG-OX) 400 MG tablet Take 400 mg by mouth 2 (two) times daily.   Multiple Vitamins-Minerals (PRESERVISION AREDS 2 PO) Take 1 capsule by mouth daily.   nitroGLYCERIN (NITROSTAT) 0.4 MG SL tablet Place 1 tablet (0.4 mg total) under the tongue every 5 (five) minutes as needed for chest pain. (Patient not taking: Reported on 06/22/2021)   omeprazole (PRILOSEC) 20 MG capsule Take 1 capsule (20 mg total) by mouth daily.   predniSONE (DELTASONE) 10 MG tablet 6 tablets daily for 3 days, then reduce by 1 tablet daily until gone   psyllium (METAMUCIL) 58.6 % packet Take 1 packet by mouth daily.   Riboflavin 100 MG CAPS Take 1 capsule by mouth 2 (two) times daily.    zolpidem (AMBIEN) 10 MG tablet TAKE 1 TABLET BY MOUTH AT BEDTIME   Facility-Administered Encounter Medications as of 11/24/2021  Medication   denosumab (PROLIA) injection 60 mg  Allergies (verified) Erythromycin   History: Past Medical History:  Diagnosis Date   Accidental fall 10/16/2013   Anxiety    Carotid stenosis    Left    Fracture of rib 09/26/2019   High cholesterol    Hypertension    Pt states that her blood pressure is very well controlled   Migraine with aura    Neuropathy    Osteoporosis    Past Surgical History:  Procedure Laterality Date   ABDOMINAL HYSTERECTOMY     secondary to peritonitis and complications    APPENDECTOMY     TONSILLECTOMY     TUBAL LIGATION     at age 63, vaginal complicated by peritonitis   Family History  Adopted: Yes  Problem Relation Age of Onset   Breast cancer Mother 33   Cancer  Daughter        Breast    Breast cancer Daughter 63   Breast cancer Sister 61       2 times   Social History   Socioeconomic History   Marital status: Widowed    Spouse name: Not on file   Number of children: 2   Years of education: college   Highest education level: Master's degree (e.g., MA, MS, MEng, MEd, MSW, MBA)  Occupational History   Occupation: Retired  Tobacco Use   Smoking status: Former    Types: Cigarettes    Quit date: 1965    Years since quitting: 58.8   Smokeless tobacco: Never  Substance and Sexual Activity   Alcohol use: Yes    Alcohol/week: 14.0 standard drinks of alcohol    Types: 14 Glasses of wine per week    Comment: 2 glasses of wine per day.   Drug use: No   Sexual activity: Never  Other Topics Concern   Not on file  Social History Narrative   Lives at home alone.   Left-handed.   2-4 cups caffeine per day.   Social Determinants of Health   Financial Resource Strain: Low Risk  (11/24/2021)   Overall Financial Resource Strain (CARDIA)    Difficulty of Paying Living Expenses: Not hard at all  Food Insecurity: No Food Insecurity (11/24/2021)   Hunger Vital Sign    Worried About Running Out of Food in the Last Year: Never true    Ran Out of Food in the Last Year: Never true  Transportation Needs: No Transportation Needs (11/24/2021)   PRAPARE - Administrator, Civil Service (Medical): No    Lack of Transportation (Non-Medical): No  Physical Activity: Sufficiently Active (11/24/2021)   Exercise Vital Sign    Days of Exercise per Week: 6 days    Minutes of Exercise per Session: 60 min  Stress: No Stress Concern Present (11/24/2021)   [employer] of Occupational Health - Occupational Stress Questionnaire    Feeling of Stress : Not at all  Social Connections: Unknown (11/24/2021)   Social Connection and Isolation Panel [NHANES]    Frequency of Communication with Friends and Family: More than three times a week    Frequency  of Social Gatherings with Friends and Family: More than three times a week    Attends Religious Services: Not on file    Active Member of Clubs or Organizations: Yes    Attends Banker Meetings: Not on file    Marital Status: Widowed    Tobacco Counseling Counseling given: Not Answered   Clinical Intake:  Pre-visit preparation completed: Yes  Diabetes: No  How often do you need to have someone help you when you read instructions, pamphlets, or other written materials from your doctor or pharmacy?: 1 - Never  Interpreter Needed?: No      Activities of Daily Living    11/24/2021    9:21 AM  In your present state of health, do you have any difficulty performing the following activities:  Hearing? 1  Comment Hearing aids  Vision? 0  Difficulty concentrating or making decisions? 0  Walking or climbing stairs? 0  Dressing or bathing? 0  Doing errands, shopping? 0  Preparing Food and eating ? N  Using the Toilet? N  In the past six months, have you accidently leaked urine? N  Do you have problems with loss of bowel control? N  Managing your Medications? N  Managing your Finances? N  Housekeeping or managing your Housekeeping? N    Patient Care Team: Sherlene Shams, MD as PCP - General (Internal Medicine) Sherlene Shams, MD (Internal Medicine)  Indicate any recent Medical Services you may have received from other than [facility] providers in the past year (date may be approximate).     Assessment:   This is a routine wellness examination for Adalynne.  I connected with  Chivonne J Sova on 11/24/21 by a audio enabled telemedicine application and verified that I am speaking with the correct person using two identifiers.  Patient Location: Home  Provider Location: Office/Clinic  I discussed the limitations of evaluation and management by telemedicine. The patient expressed understanding and agreed to proceed.   Hearing/Vision screen Hearing  Screening - Comments:: Hearing aid, bilateral  Vision Screening - Comments:: Followed by [facility] Wears corrective lenses  They have seen their ophthalmologist in the last 12 months  Dietary issues and exercise activities discussed: Current Exercise Habits: Home exercise routine, Type of exercise: walking;yoga (zoom yoga), Time (Minutes): 60, Frequency (Times/Week): 6, Weekly Exercise (Minutes/Week): 360, Intensity: Mild Healthy diet Good water intake   Goals Addressed               This Visit's Progress     Patient Stated     DIET - REDUCE SUGAR INTAKE (pt-stated)   On track      Depression Screen    11/24/2021    9:28 AM 06/22/2021    8:12 AM 06/09/2021    2:47 PM 01/05/2021   11:39 AM 12/14/2020   10:48 AM 09/14/2020    1:23 PM 06/02/2020    8:39 AM  PHQ 2/9 Scores  PHQ - 2 Score 0 0 0 0 0 0 0  PHQ- 9 Score       1    Fall Risk    11/24/2021    9:31 AM 06/22/2021    8:12 AM 06/09/2021    2:47 PM 02/01/2021   10:45 AM 01/05/2021   11:39 AM  Fall Risk   Falls in the past year? 0 0 0 0 0  Number falls in past yr: 0      Injury with Fall? 0      Risk for fall due to : No Fall Risks No Fall Risks No Fall Risks No Fall Risks No Fall Risks  Follow up Falls evaluation completed Falls evaluation completed Falls evaluation completed Falls evaluation completed Falls evaluation completed    FALL RISK PREVENTION PERTAINING TO THE HOME: Home free of loose throw rugs in walkways, pet beds, electrical cords, etc? Yes  Adequate lighting in your home  to reduce risk of falls? Yes   ASSISTIVE DEVICES UTILIZED TO PREVENT FALLS: Life alert? No  Use of a cane, walker or w/c? No   TIMED UP AND GO: Was the test performed? No .   Cognitive Function: Patient is alert and oriented x3.  Manages her own finances and medications. Denies difficulty focusing, making decisions, memory loss.      11/08/2017    3:57 PM 06/05/2016    9:35 AM  MMSE - Mini Mental State Exam   Orientation to time 5 5  Orientation to Place 5 5  Registration 3 3  Attention/ Calculation 5 5  Recall 3 3  Language- name 2 objects 2 2  Language- repeat 1 1  Language- follow 3 step command 3 3  Language- read & follow direction 1 1  Write a sentence 1 1  Copy design 1 1  Total score 30 30        11/24/2021    9:25 AM  6CIT Screen  What Year? 0 points  What month? 0 points  What time? 0 points  Months in reverse 0 points    Immunizations Immunization History  Administered Date(s) Administered   Fluad Quad(high Dose 65+) 10/10/2018, 11/04/2019, 11/21/2021   Influenza, High Dose Seasonal PF 10/27/2014, 10/05/2015, 10/24/2016, 10/30/2017   Influenza,inj,Quad PF,6+ Mos 10/17/2012, 10/30/2013   Influenza-Unspecified 12/01/2020   PFIZER(Purple Top)SARS-COV-2 Vaccination 02/05/2019, 02/26/2019, 10/22/2019, 04/28/2020   Pneumococcal Conjugate-13 10/30/2013   Pneumococcal Polysaccharide-23 12/30/2010   Tdap 02/19/2013   Zoster Recombinat (Shingrix) 05/14/2017, 07/31/2017   Zoster, Live 01/01/2008    Screening Tests Health Maintenance  Topic Date Due   COVID-19 Vaccine (5 - Pfizer risk series) 12/10/2021 (Originally 06/23/2020)   Medicare Annual Wellness (AWV)  11/25/2022   TETANUS/TDAP  02/20/2023   Pneumonia Vaccine 53+ Years old  Completed   INFLUENZA VACCINE  Completed   DEXA SCAN  Completed   Zoster Vaccines- Shingrix  Completed   HPV VACCINES  Aged Out    Health Maintenance There are no preventive care reminders to display for this patient.  Lung Cancer Screening: (Low Dose CT Chest recommended if Age 50-80 years, 30 pack-year currently smoking OR have quit w/in 15years.) does not qualify.   Hepatitis C Screening: does not qualify  Vision Screening: Recommended annual ophthalmology exams for early detection of glaucoma and other disorders of the eye.  Dental Screening: Recommended annual dental exams for proper oral hygiene  Community Resource Referral /  Chronic Care Management: CRR required this visit?  No   CCM required this visit?  No      Plan:     I have personally reviewed and noted the following in the patient's chart:   Medical and social history Use of alcohol, tobacco or illicit drugs  Current medications and supplements including opioid prescriptions. Patient is not currently taking opioid prescriptions. Functional ability and status Nutritional status Physical activity Advanced directives List of other physicians Hospitalizations, surgeries, and ER visits in previous 12 months Vitals Screenings to include cognitive, depression, and falls Referrals and appointments  In addition, I have reviewed and discussed with patient certain preventive protocols, quality metrics, and best practice recommendations. A written personalized care plan for preventive services as well as general preventive health recommendations were provided to patient.     Merek Niu L Motley, LPN   23/76/2831     I have reviewed the above information and agree with above.   Duncan Dull, MD

## 2021-11-28 NOTE — Telephone Encounter (Signed)
Prolia PA Approved   

## 2021-12-02 ENCOUNTER — Other Ambulatory Visit: Payer: Self-pay | Admitting: Internal Medicine

## 2021-12-08 ENCOUNTER — Ambulatory Visit (INDEPENDENT_AMBULATORY_CARE_PROVIDER_SITE_OTHER): Payer: Medicare PPO

## 2021-12-08 DIAGNOSIS — M810 Age-related osteoporosis without current pathological fracture: Secondary | ICD-10-CM | POA: Diagnosis not present

## 2021-12-08 MED ORDER — DENOSUMAB 60 MG/ML ~~LOC~~ SOSY
60.0000 mg | PREFILLED_SYRINGE | Freq: Once | SUBCUTANEOUS | Status: AC
  Administered 2021-12-08: 60 mg via SUBCUTANEOUS

## 2021-12-08 MED ORDER — DENOSUMAB 60 MG/ML ~~LOC~~ SOSY: 60.0000 mg | PREFILLED_SYRINGE | Freq: Once | SUBCUTANEOUS | Status: DC

## 2021-12-08 NOTE — Progress Notes (Signed)
Pt presents today for Prolia injection. Left arm, SQ. Pt voiced no concerns nor showed any signs of distress during injection.

## 2021-12-14 DIAGNOSIS — H903 Sensorineural hearing loss, bilateral: Secondary | ICD-10-CM | POA: Diagnosis not present

## 2021-12-29 ENCOUNTER — Ambulatory Visit: Payer: Medicare PPO | Admitting: Internal Medicine

## 2021-12-29 ENCOUNTER — Encounter: Payer: Self-pay | Admitting: Internal Medicine

## 2021-12-29 VITALS — BP 122/80 | HR 77 | Temp 98.0°F | Ht 64.0 in | Wt 144.2 lb

## 2021-12-29 DIAGNOSIS — I10 Essential (primary) hypertension: Secondary | ICD-10-CM | POA: Diagnosis not present

## 2021-12-29 DIAGNOSIS — Z1231 Encounter for screening mammogram for malignant neoplasm of breast: Secondary | ICD-10-CM

## 2021-12-29 DIAGNOSIS — K649 Unspecified hemorrhoids: Secondary | ICD-10-CM

## 2021-12-29 DIAGNOSIS — M810 Age-related osteoporosis without current pathological fracture: Secondary | ICD-10-CM | POA: Diagnosis not present

## 2021-12-29 DIAGNOSIS — F5105 Insomnia due to other mental disorder: Secondary | ICD-10-CM

## 2021-12-29 DIAGNOSIS — E785 Hyperlipidemia, unspecified: Secondary | ICD-10-CM

## 2021-12-29 DIAGNOSIS — Z0000 Encounter for general adult medical examination without abnormal findings: Secondary | ICD-10-CM

## 2021-12-29 DIAGNOSIS — E538 Deficiency of other specified B group vitamins: Secondary | ICD-10-CM

## 2021-12-29 DIAGNOSIS — M7062 Trochanteric bursitis, left hip: Secondary | ICD-10-CM

## 2021-12-29 DIAGNOSIS — I700 Atherosclerosis of aorta: Secondary | ICD-10-CM

## 2021-12-29 DIAGNOSIS — Z23 Encounter for immunization: Secondary | ICD-10-CM | POA: Diagnosis not present

## 2021-12-29 DIAGNOSIS — F418 Other specified anxiety disorders: Secondary | ICD-10-CM | POA: Diagnosis not present

## 2021-12-29 DIAGNOSIS — F411 Generalized anxiety disorder: Secondary | ICD-10-CM

## 2021-12-29 DIAGNOSIS — R198 Other specified symptoms and signs involving the digestive system and abdomen: Secondary | ICD-10-CM

## 2021-12-29 DIAGNOSIS — F409 Phobic anxiety disorder, unspecified: Secondary | ICD-10-CM

## 2021-12-29 LAB — COMPREHENSIVE METABOLIC PANEL
ALT: 17 U/L (ref 0–35)
AST: 18 U/L (ref 0–37)
Albumin: 4.5 g/dL (ref 3.5–5.2)
Alkaline Phosphatase: 55 U/L (ref 39–117)
BUN: 12 mg/dL (ref 6–23)
CO2: 29 mEq/L (ref 19–32)
Calcium: 9 mg/dL (ref 8.4–10.5)
Chloride: 104 mEq/L (ref 96–112)
Creatinine, Ser: 0.65 mg/dL (ref 0.40–1.20)
GFR: 79.32 mL/min (ref >60.00)
Glucose, Bld: 66 mg/dL — ABNORMAL LOW (ref 70–99)
Potassium: 3.9 mEq/L (ref 3.5–5.1)
Sodium: 141 mEq/L (ref 135–145)
Total Bilirubin: 0.4 mg/dL (ref 0.2–1.2)
Total Protein: 6.7 g/dL (ref 6.0–8.3)

## 2021-12-29 LAB — LIPID PANEL
Cholesterol: 172 mg/dL (ref 0–200)
HDL: 99.3 mg/dL (ref >39.00)
LDL Cholesterol: 59 mg/dL (ref 0–99)
NonHDL: 72.66
Total CHOL/HDL Ratio: 2
Triglycerides: 67 mg/dL (ref 0.0–149.0)
VLDL: 13.4 mg/dL (ref 0.0–40.0)

## 2021-12-29 LAB — TSH: TSH: 2.92 u[IU]/mL (ref 0.35–5.50)

## 2021-12-29 MED ORDER — ZOLPIDEM TARTRATE 10 MG PO TABS
ORAL_TABLET | ORAL | 5 refills | Status: DC
Start: 2021-12-29 — End: 2022-07-13

## 2021-12-29 NOTE — Patient Instructions (Addendum)
Your perineal area is NORMAL,  NOTHING TO WORRY ABOUT!  You received the Prevnar 20 vaccine for pneumonia today   I do recommend the RSV vaccine for you, .  It is now available at your pharmacy  and will protect you against the Respiratory Syncytial Virus   Your annual mammogram has been ordered  and is due now.  You can call [place] to schedule it

## 2021-12-29 NOTE — Assessment & Plan Note (Signed)
She remains ambien dependent .  I have reviewed the dangers of prescribing this drug to the elderly with patient.  She has been [place] dependent for over five years, and prior attempts to use an alternative medication  have been unsuccessful resulting in loss of sleep.  She did not sleep  well during a trial of  reducing the dose to 5mg   And is willing to accept the risks of adverse events. Refilled today at the 10 mg dose

## 2021-12-29 NOTE — Progress Notes (Unsigned)
Patient ID: Laura [name], female    DOB: Nov 01, 1932  Age: 88 y.o. MRN: [id number]  The patient is here for her  annual preventive examination and management of other chronic and acute problems.   The risk factors are reflected in the social history.  The roster of all physicians providing medical care to patient - is listed in the Snapshot section of the chart.  Activities of daily living:  The patient is 100% independent in all ADLs: dressing, toileting, feeding as well as independent mobility  Home safety : The patient has smoke detectors in the home. They wear seatbelts.  There are no firearms at home. There is no violence in the home.   There is no risks for hepatitis, STDs or HIV. There is no   history of blood transfusion. They have no travel history to infectious disease endemic areas of the world.  The patient has seen their dentist in the last six month. They have seen their eye doctor in the last year. They admit to slight hearing difficulty with regard to whispered voices and some television programs.  They have deferred audiologic testing in the last year.  They do not  have excessive sun exposure. Discussed the need for sun protection: hats, long sleeves and use of sunscreen if there is significant sun exposure.   Diet: the importance of a healthy diet is discussed. They do have a healthy diet.  The benefits of regular aerobic exercise were discussed. She walks 4 times per week ,  20 minutes.   Depression screen: there are no signs or vegative symptoms of depression- irritability, change in appetite, anhedonia, sadness/tearfullness.  Cognitive assessment: the patient manages all their financial and personal affairs and is actively engaged. They could relate day,date,year and events; recalled 2/3 objects at 3 minutes; performed clock-face test normally.  The following portions of the patient's history were reviewed and updated as appropriate: allergies, current medications, past  family history, past medical history,  past surgical history, past social history  and problem list.  Visual acuity was not assessed per patient preference since she has regular follow up with her ophthalmologist. Hearing and body mass index were assessed and reviewed.   During the course of the visit the patient was educated and counseled about appropriate screening and preventive services including : fall prevention , diabetes screening, nutrition counseling, colorectal cancer screening, and recommended immunizations.    CC: The primary encounter diagnosis was Encounter for preventive health examination. Diagnoses of Altered bowel function, Breast cancer screening by mammogram, Primary hypertension, Hyperlipidemia LDL goal <70, Hemorrhoids, unspecified hemorrhoid type, Insomnia due to anxiety and fear, Need for pneumococcal vaccine, Anxiety about health, Aortic atherosclerosis (HCC), B12 deficiency, Trochanteric bursitis of left hip, Age-related osteoporosis without current pathological fracture, and Generalized anxiety disorder were also pertinent to this visit.  1) has a persistent firm feeling between anus and vagina. Has been feeling it for years.  Not painful,  not bleeding   Bowels moving regularly with daily use of metamucil   2)  right eye has been feeling irritated and has been watery. Using thera tears   3) Aortic atherosclerosis  we reviewed findings of prior CT scan today..  Patient is tolerating high potency statin therapy    4) Hypertension: patient checks blood pressure twice weekly at home.  Readings have been for the most part < 130/80 at rest . Patient is following a reduce salt diet most days and is taking medications as prescribed   History  Laura [name] has a past medical history of Accidental fall (10/16/2013), Anxiety, Carotid stenosis, COVID-19 virus infection (09/14/2020), Fracture of rib (09/26/2019), High cholesterol, Hypertension, Migraine with aura, Neuropathy, and  Osteoporosis.   She has a past surgical history that includes Tubal ligation; Abdominal hysterectomy; Tonsillectomy; and Appendectomy.   Her family history includes Breast cancer (age of onset: 58) in her daughter; Breast cancer (age of onset: 80) in her sister; Breast cancer (age of onset: 73) in her mother; Cancer in her daughter. She was adopted.She reports that she quit smoking about 58 years ago. Her smoking use included cigarettes. She has never used smokeless tobacco. She reports current alcohol use of about 14.0 standard drinks of alcohol per week. She reports that she does not use drugs.  Outpatient Medications Prior to Visit  Medication Sig Dispense Refill   amLODipine (NORVASC) 2.5 MG tablet TAKE 1 TABLET(2.5 MG) BY MOUTH DAILY 90 tablet 3   atorvastatin (LIPITOR) 20 MG tablet TAKE 1 TABLET(20 MG) BY MOUTH DAILY 90 tablet 3   calcium carbonate (OS-CAL) 600 MG TABS Take 600 mg by mouth 2 (two) times daily with a meal.     Cholecalciferol (VITAMIN D3) 50 MCG (2000 UT) CAPS Take 1 capsule by mouth daily.     Coenzyme Q10 (CO Q 10) 100 MG CAPS Take 1 capsule by mouth daily.     Cyanocobalamin 2500 MCG SUBL PLACE 1 TABLET UNDER THE TONGUE DAILY AS DIRECTED 90 tablet 1   denosumab (PROLIA) 60 MG/ML SOLN injection Inject 60 mg into the skin every 6 (six) months. Administer in upper arm, thigh, or abdomen     escitalopram (LEXAPRO) 10 MG tablet TAKE 1 TABLET(10 MG) BY MOUTH DAILY 90 tablet 1   magnesium oxide (MAG-OX) 400 MG tablet Take 400 mg by mouth 2 (two) times daily.     Multiple Vitamins-Minerals (PRESERVISION AREDS 2 PO) Take 1 capsule by mouth daily.     nitroGLYCERIN (NITROSTAT) 0.4 MG SL tablet Place 1 tablet (0.4 mg total) under the tongue every 5 (five) minutes as needed for chest pain. 50 tablet 3   omeprazole (PRILOSEC) 20 MG capsule Take 1 capsule (20 mg total) by mouth daily. 30 capsule 3   predniSONE (DELTASONE) 10 MG tablet 6 tablets daily for 3 days, then reduce by 1 tablet  daily until gone 33 tablet 0   psyllium (METAMUCIL) 58.6 % packet Take 1 packet by mouth daily.     Riboflavin 100 MG CAPS Take 1 capsule by mouth 2 (two) times daily.      zolpidem (AMBIEN) 10 MG tablet TAKE 1 TABLET BY MOUTH AT BEDTIME 30 tablet 5   ALPRAZolam (XANAX) 0.5 MG tablet Take 0.5 tablets (0.25 mg total) by mouth 2 (two) times daily as needed for anxiety or sleep. (Patient not taking: Reported on 06/22/2021) 30 tablet 0   Facility-Administered Medications Prior to Visit  Medication Dose Route Frequency Provider Last Rate Last Admin   denosumab (PROLIA) injection 60 mg  60 mg Subcutaneous Once Sherlene Shams, MD        Review of Systems  Patient denies headache, fevers, malaise, unintentional weight loss, skin rash, eye pain, sinus congestion and sinus pain, sore throat, dysphagia,  hemoptysis , cough, dyspnea, wheezing, chest pain, palpitations, orthopnea, edema, abdominal pain, nausea, melena, diarrhea, constipation, flank pain, dysuria, hematuria, urinary  Frequency, nocturia, numbness, tingling, seizures,  Focal weakness, Loss of consciousness,  Tremor,  depression, and suicidal ideation.     Objective:  BP 122/80  Pulse 77   Temp 98 F (36.7 C)   Ht 5\' 4"  (1.626 m)   Wt 144 lb 3.2 oz (65.4 kg)   SpO2 97%   BMI 24.75 kg/m   Physical Exam  General appearance: alert, cooperative and appears stated age Head: Normocephalic, without obvious abnormality, atraumatic Eyes: conjunctivae/corneas clear. PERRL, EOM's intact. Fundi benign. Ears: normal TM's and external ear canals both ears Nose: Nares normal. Septum midline. Mucosa normal. No drainage or sinus tenderness. Throat: lips, mucosa, and tongue normal; teeth and gums normal Neck: no adenopathy, no carotid bruit, no JVD, supple, symmetrical, trachea midline and thyroid not enlarged, symmetric, no tenderness/mass/nodules Lungs: clear to auscultation bilaterally Breasts: normal appearance, no masses or  tenderness Heart: regular rate and rhythm, S1, S2 normal, no murmur, click, rub or gallop Abdomen: soft, non-tender; bowel sounds normal; no masses,  no organomegaly Extremities: extremities normal, atraumatic, no cyanosis or edema Pulses: 2+ and symmetric Skin: Skin color, texture, turgor normal. No rashes or lesions Neurologic: Alert and oriented X 3, normal strength and tone. Normal symmetric reflexes. Normal coordination and gait.     Assessment & Plan:   Problem List Items Addressed This Visit     Altered bowel function    Moving regularly since adding a daily dose of metamucil      Anxiety about health    I have reassured her that her perineal concerns are unfounded, based on her exam today  Continue use of  lexapro prescribed,  Prn alprazolam      Aortic atherosclerosis (HCC)    WE reviewed findings of prior CT scan today..  Patient is tolerating upgrade to   high potency statin therapy , LDL is well below goal and liver enzymes are normal. No changes today  Lab Results  Component Value Date   CHOL 172 12/29/2021   HDL 99.30 12/29/2021   LDLCALC 59 12/29/2021   LDLDIRECT 109.7 09/27/2011   TRIG 67.0 12/29/2021   CHOLHDL 2 12/29/2021   Lab Results  Component Value Date   ALT 17 12/29/2021   AST 18 12/29/2021   ALKPHOS 55 12/29/2021   BILITOT 0.4 12/29/2021        B12 deficiency    Managed with sublingual supplementation.  Last level significantly improved.   Lab Results  Component Value Date   VITAMINB12 703 12/01/2018        Bursitis of left hip    Resolved .  Continue regular exercise, stretching and yoga.       Encounter for preventive health examination - Primary    age appropriate education and counseling updated, referrals for preventative services and immunizations addressed, dietary and smoking counseling addressed, most recent labs reviewed.  I have personally reviewed and have noted:   1) the patient's medical and social history 2) The pt's  use of alcohol, tobacco, and illicit drugs 3) The patient's current medications and supplements 4) Functional ability including ADL's, fall risk, home safety risk, hearing and visual impairment 5) Diet and physical activities 6) Evidence for depression or mood disorder 7) The patient's height, weight, and BMI have been recorded in the chart   I have made referrals, and provided counseling and education based on review of the above       Generalized anxiety disorder    Recent episodes of panic attacks have resolved with use of lexapro.  She does not fear death,  Only disability from stroke or other disease that would render her unable to care for herself.  Hemorrhoid    No recent occurrence of irritation.  Reminded to use  stool softeners and use of anusol hc nupercainal for next occurrence       HTN (hypertension)    Patient is tolerating amlodipine and and taking as as prescribed  Home BP readings have been done about once per week and are  generally < 130/80 .  No changes today      Relevant Orders   Comprehensive metabolic panel (Completed)   Hyperlipidemia LDL goal <70   Relevant Orders   Lipid panel (Completed)   TSH (Completed)   Insomnia due to anxiety and fear    She remains ambien dependent .  I have reviewed the dangers of prescribing this drug to the elderly with patient.  She has been [place] dependent for over five years, and prior attempts to use an alternative medication  have been unsuccessful resulting in loss of sleep.  She did not sleep  well during a trial of  reducing the dose to 5mg   And is willing to accept the risks of adverse events. Refilled today at the 10 mg dose       Osteoporosis    With no history of fractures. Her  T scores have improved into the osteopenia range.  Continue Prolia every 6 months calcium and Vitamin D supplementation  Last vitamin D Lab Results  Component Value Date   VD25OH 39 06/09/2021         Other Visit Diagnoses      Breast cancer screening by mammogram       Relevant Orders   MM 3D SCREEN BREAST BILATERAL   Need for pneumococcal vaccine       Relevant Orders   Pneumococcal conjugate vaccine 20-valent (Prevnar 20) (Completed)      Follow-up: Return in about 6 months (around 06/30/2022).   08/30/2022, MD

## 2021-12-29 NOTE — Assessment & Plan Note (Signed)
No recent occurrence of irritation.  Reminded to use  stool softeners and use of anusol hc nupercainal for next occurrence

## 2021-12-29 NOTE — Assessment & Plan Note (Signed)
Moving regularly since adding a daily dose of metamucil

## 2021-12-31 NOTE — Assessment & Plan Note (Signed)
I have reassured her that her perineal concerns are unfounded, based on her exam today  Continue use of  lexapro prescribed,  Prn alprazolam

## 2021-12-31 NOTE — Assessment & Plan Note (Signed)
Recent episodes of panic attacks have resolved with use of lexapro.  She does not fear death,  Only disability from stroke or other disease that would render her unable to care for herself.

## 2021-12-31 NOTE — Assessment & Plan Note (Signed)
Patient is tolerating amlodipine and and taking as as prescribed  Home BP readings have been done about once per week and are  generally < 130/80 .  No changes today

## 2021-12-31 NOTE — Assessment & Plan Note (Addendum)
With no history of fractures. Her  T scores have improved into the osteopenia range.  Continue Prolia every 6 months calcium and Vitamin D supplementation  Last vitamin D Lab Results  Component Value Date   VD25OH 39 06/09/2021

## 2021-12-31 NOTE — Assessment & Plan Note (Signed)
Resolved .  Continue regular exercise, stretching and yoga.

## 2021-12-31 NOTE — Assessment & Plan Note (Signed)
Managed with sublingual supplementation.  Last level significantly improved.   Lab Results  Component Value Date   VITAMINB12 703 12/01/2018

## 2021-12-31 NOTE — Assessment & Plan Note (Signed)

## 2021-12-31 NOTE — Assessment & Plan Note (Signed)
WE reviewed findings of prior CT scan today..  Patient is tolerating upgrade to   high potency statin therapy , LDL is well below goal and liver enzymes are normal. No changes today  Lab Results  Component Value Date   CHOL 172 12/29/2021   HDL 99.30 12/29/2021   LDLCALC 59 12/29/2021   LDLDIRECT 109.7 09/27/2011   TRIG 67.0 12/29/2021   CHOLHDL 2 12/29/2021   Lab Results  Component Value Date   ALT 17 12/29/2021   AST 18 12/29/2021   ALKPHOS 55 12/29/2021   BILITOT 0.4 12/29/2021

## 2022-01-01 ENCOUNTER — Encounter: Payer: Self-pay | Admitting: Internal Medicine

## 2022-02-02 ENCOUNTER — Ambulatory Visit: Payer: Medicare PPO | Admitting: Internal Medicine

## 2022-02-02 ENCOUNTER — Encounter: Payer: Self-pay | Admitting: Internal Medicine

## 2022-02-02 VITALS — BP 134/66 | HR 81 | Temp 98.2°F | Ht 64.0 in | Wt 145.8 lb

## 2022-02-02 DIAGNOSIS — H93A9 Pulsatile tinnitus, unspecified ear: Secondary | ICD-10-CM

## 2022-02-02 NOTE — Progress Notes (Signed)
Subjective:  Patient ID: Laura [name], female    DOB: 1932-10-28  Age: 89 y.o. MRN: [id number]  CC: The encounter diagnosis was Pulsatile tinnitus.   HPI Laura [name] presents for  Chief Complaint  Patient presents with   thumping noise in right ear   Pulsatile tinnitus in right ear:   Hearing  heart beat in right ear, has been occurring  for the last several months.  Intermittent .   Not accompanied by dizziness,  hearing loss, vision changes, or headaches,   History of chronic tinnitus in left ear for the past 40 yrs.   History of carotid stenosis  suggested by 2016 ultrasound, repeat doppler done in 2017 negatie     Outpatient Medications Prior to Visit  Medication Sig Dispense Refill   amLODipine (NORVASC) 2.5 MG tablet TAKE 1 TABLET(2.5 MG) BY MOUTH DAILY 90 tablet 3   atorvastatin (LIPITOR) 20 MG tablet TAKE 1 TABLET(20 MG) BY MOUTH DAILY 90 tablet 3   calcium carbonate (OS-CAL) 600 MG TABS Take 600 mg by mouth 2 (two) times daily with a meal.     Cholecalciferol (VITAMIN D3) 50 MCG (2000 UT) CAPS Take 1 capsule by mouth daily.     Coenzyme Q10 (CO Q 10) 100 MG CAPS Take 1 capsule by mouth daily.     Cyanocobalamin 2500 MCG SUBL PLACE 1 TABLET UNDER THE TONGUE DAILY AS DIRECTED 90 tablet 1   denosumab (PROLIA) 60 MG/ML SOLN injection Inject 60 mg into the skin every 6 (six) months. Administer in upper arm, thigh, or abdomen     escitalopram (LEXAPRO) 10 MG tablet TAKE 1 TABLET(10 MG) BY MOUTH DAILY 90 tablet 1   magnesium oxide (MAG-OX) 400 MG tablet Take 400 mg by mouth 2 (two) times daily.     Multiple Vitamins-Minerals (PRESERVISION AREDS 2 PO) Take 1 capsule by mouth daily.     nitroGLYCERIN (NITROSTAT) 0.4 MG SL tablet Place 1 tablet (0.4 mg total) under the tongue every 5 (five) minutes as needed for chest pain. 50 tablet 3   omeprazole (PRILOSEC) 20 MG capsule Take 1 capsule (20 mg total) by mouth daily. 30 capsule 3   psyllium (METAMUCIL) 58.6 % packet Take 1  packet by mouth daily.     Riboflavin 100 MG CAPS Take 1 capsule by mouth 2 (two) times daily.      zolpidem (AMBIEN) 10 MG tablet TAKE 1 TABLET BY MOUTH AT BEDTIME 30 tablet 5   ALPRAZolam (XANAX) 0.5 MG tablet Take 0.5 tablets (0.25 mg total) by mouth 2 (two) times daily as needed for anxiety or sleep. (Patient not taking: Reported on 06/22/2021) 30 tablet 0   predniSONE (DELTASONE) 10 MG tablet 6 tablets daily for 3 days, then reduce by 1 tablet daily until gone 33 tablet 0   Facility-Administered Medications Prior to Visit  Medication Dose Route Frequency Provider Last Rate Last Admin   denosumab (PROLIA) injection 60 mg  60 mg Subcutaneous Once Crecencio Mc, MD        Review of Systems;  Patient denies headache, fevers, malaise, unintentional weight loss, skin rash, eye pain, sinus congestion and sinus pain, sore throat, dysphagia,  hemoptysis , cough, dyspnea, wheezing, chest pain, palpitations, orthopnea, edema, abdominal pain, nausea, melena, diarrhea, constipation, flank pain, dysuria, hematuria, urinary  Frequency, nocturia, numbness, tingling, seizures,  Focal weakness, Loss of consciousness,  Tremor, insomnia, depression, anxiety, and suicidal ideation.      Objective:  BP 134/66   Pulse  81   Temp 98.2 F (36.8 C) (Oral)   Ht 5\' 4"  (1.626 m)   Wt 145 lb 12.8 oz (66.1 kg)   SpO2 97%   BMI 25.03 kg/m   BP Readings from Last 3 Encounters:  02/02/22 134/66  12/29/21 122/80  06/22/21 128/68    Wt Readings from Last 3 Encounters:  02/02/22 145 lb 12.8 oz (66.1 kg)  12/29/21 144 lb 3.2 oz (65.4 kg)  11/24/21 142 lb (64.4 kg)    Physical Exam Vitals reviewed.  Constitutional:      General: She is not in acute distress.    Appearance: Normal appearance. She is normal weight. She is not ill-appearing, toxic-appearing or diaphoretic.  HENT:     Head: Normocephalic.     Left Ear: Tympanic membrane normal.  Eyes:     General: No scleral icterus.       Right eye: No  discharge.        Left eye: No discharge.     Extraocular Movements: Extraocular movements intact.     Conjunctiva/sclera: Conjunctivae normal.     Pupils: Pupils are equal, round, and reactive to light.  Cardiovascular:     Rate and Rhythm: Normal rate and regular rhythm.     Pulses: Normal pulses.     Heart sounds: Normal heart sounds.  Pulmonary:     Effort: Pulmonary effort is normal. No respiratory distress.     Breath sounds: Normal breath sounds.  Musculoskeletal:        General: Normal range of motion.     Cervical back: Normal range of motion and neck supple.  Skin:    General: Skin is warm and dry.  Neurological:     General: No focal deficit present.     Mental Status: She is alert and oriented to person, place, and time. Mental status is at baseline.     Cranial Nerves: No cranial nerve deficit.  Psychiatric:        Mood and Affect: Mood normal.        Behavior: Behavior normal.        Thought Content: Thought content normal.        Judgment: Judgment normal.     Lab Results  Component Value Date   HGBA1C 5.6 02/10/2019   HGBA1C 5.4 05/05/2014    Lab Results  Component Value Date   CREATININE 0.65 12/29/2021   CREATININE 0.74 06/09/2021   CREATININE 0.65 12/14/2020    Lab Results  Component Value Date   WBC 6.7 12/14/2020   HGB 13.4 12/14/2020   HCT 40.9 12/14/2020   PLT 270.0 12/14/2020   GLUCOSE 66 (L) 12/29/2021   CHOL 172 12/29/2021   TRIG 67.0 12/29/2021   HDL 99.30 12/29/2021   LDLDIRECT 109.7 09/27/2011   LDLCALC 59 12/29/2021   ALT 17 12/29/2021   AST 18 12/29/2021   NA 141 12/29/2021   K 3.9 12/29/2021   CL 104 12/29/2021   CREATININE 0.65 12/29/2021   BUN 12 12/29/2021   CO2 29 12/29/2021   TSH 2.92 12/29/2021   INR 1.1 06/17/2020   HGBA1C 5.6 02/10/2019   MICROALBUR <0.7 06/01/2020    No results found.  Assessment & Plan:  .Pulsatile tinnitus Assessment & Plan: She has a normal neurologic exam, intact TM;s,  and no  carotid bruits. Referring for carotid duplex study (last one 2017).  No symptoms worrisome for acoustic neuroma.   Orders: -     US Carotid Bilateral; Future  I provided 20 minutes of face-to-face time during this encounter reviewing patient's last visit with me, patient's  most recent visit with cardiology,   last carotid duplex in 2017 , counseling on currently addressed issues,  and post visit ordering to diagnostics and therapeutics .   Follow-up: No follow-ups on file.   Crecencio Mc, MD

## 2022-02-02 NOTE — Assessment & Plan Note (Signed)
She has a normal neurologic exam, intact TM;s,  and no carotid bruits. Referring for carotid duplex study (last one 2017).  No symptoms worrisome for acoustic neuroma.

## 2022-02-02 NOTE — Patient Instructions (Signed)
I will get you set up for carotid dopplers to investigate your pulsatile tinnitus  If you develop any balance issues.  Vertigo,  or facial parathesias  let me know and I will add the MRI brain

## 2022-02-08 ENCOUNTER — Telehealth: Payer: Self-pay | Admitting: Internal Medicine

## 2022-02-08 NOTE — Telephone Encounter (Deleted)
P tcalled and car

## 2022-02-08 NOTE — Telephone Encounter (Signed)
Pt called wanting to talk to the cma about an ultrasound

## 2022-02-09 NOTE — Telephone Encounter (Signed)
Patient states she is following-up on her call from yesterday.  I let patient know that Adair Laundry, CMA, did call her back, but her phone was disconnected.  Patient states she had her phone in her pocket and sometimes weird things happen when she does that.  Patient states she would like for [place] to please call her back and she will plan to not have her phone in her pocket.

## 2022-02-09 NOTE — Telephone Encounter (Signed)
Pt has not been scheduled for the Carotid US that was ordered on 02/02/2022.

## 2022-02-09 NOTE — Telephone Encounter (Signed)
Phone call was disconnected.

## 2022-02-12 ENCOUNTER — Telehealth: Payer: Self-pay | Admitting: Internal Medicine

## 2022-02-12 NOTE — Telephone Encounter (Signed)
Lft pt vm to call ofc to sch US. thanks ?

## 2022-02-12 NOTE — Telephone Encounter (Signed)
noted 

## 2022-02-19 ENCOUNTER — Ambulatory Visit: Payer: Medicare PPO | Admitting: Internal Medicine

## 2022-02-21 ENCOUNTER — Ambulatory Visit: Payer: Medicare PPO

## 2022-02-28 ENCOUNTER — Encounter: Payer: Self-pay | Admitting: Internal Medicine

## 2022-02-28 ENCOUNTER — Telehealth: Payer: Self-pay | Admitting: Internal Medicine

## 2022-02-28 ENCOUNTER — Other Ambulatory Visit: Payer: Self-pay

## 2022-02-28 MED ORDER — AMLODIPINE BESYLATE 2.5 MG PO TABS: ORAL_TABLET | ORAL | 3 refills | Status: DC

## 2022-02-28 NOTE — Telephone Encounter (Signed)
Per pt mychart message - does not need refills on medications. Message disregarded per pt

## 2022-02-28 NOTE — Telephone Encounter (Signed)
Pt need a refill on escitalopram and amLODipine sent to [facility]

## 2022-03-20 ENCOUNTER — Ambulatory Visit
Admission: RE | Admit: 2022-03-20 | Discharge: 2022-03-20 | Disposition: A | Discharge status: Home / Self Care | Payer: Medicare PPO | Source: Ambulatory Visit | Attending: Internal Medicine | Admitting: Internal Medicine

## 2022-03-20 DIAGNOSIS — H93A9 Pulsatile tinnitus, unspecified ear: Secondary | ICD-10-CM

## 2022-03-20 DIAGNOSIS — I10 Essential (primary) hypertension: Secondary | ICD-10-CM | POA: Diagnosis not present

## 2022-03-20 DIAGNOSIS — H93A3 Pulsatile tinnitus, bilateral: Secondary | ICD-10-CM | POA: Diagnosis not present

## 2022-03-20 DIAGNOSIS — E785 Hyperlipidemia, unspecified: Secondary | ICD-10-CM | POA: Diagnosis not present

## 2022-03-20 DIAGNOSIS — I6523 Occlusion and stenosis of bilateral carotid arteries: Secondary | ICD-10-CM | POA: Diagnosis not present

## 2022-03-22 DIAGNOSIS — H6591 Unspecified nonsuppurative otitis media, right ear: Secondary | ICD-10-CM | POA: Diagnosis not present

## 2022-04-02 DIAGNOSIS — H35372 Puckering of macula, left eye: Secondary | ICD-10-CM | POA: Diagnosis not present

## 2022-04-02 DIAGNOSIS — H353131 Nonexudative age-related macular degeneration, bilateral, early dry stage: Secondary | ICD-10-CM | POA: Diagnosis not present

## 2022-04-02 DIAGNOSIS — H2513 Age-related nuclear cataract, bilateral: Secondary | ICD-10-CM | POA: Diagnosis not present

## 2022-04-02 DIAGNOSIS — Z0100 Encounter for examination of eyes and vision without abnormal findings: Secondary | ICD-10-CM | POA: Diagnosis not present

## 2022-05-20 ENCOUNTER — Telehealth: Payer: Self-pay | Admitting: *Deleted

## 2022-05-20 NOTE — Telephone Encounter (Signed)
$  80 due for Prolia; PA on file (good until 01/29/2023)

## 2022-05-22 DIAGNOSIS — L821 Other seborrheic keratosis: Secondary | ICD-10-CM | POA: Diagnosis not present

## 2022-05-22 DIAGNOSIS — D1801 Hemangioma of skin and subcutaneous tissue: Secondary | ICD-10-CM | POA: Diagnosis not present

## 2022-06-11 ENCOUNTER — Encounter: Payer: Self-pay | Admitting: Internal Medicine

## 2022-06-11 ENCOUNTER — Ambulatory Visit: Payer: Medicare PPO | Admitting: Internal Medicine

## 2022-06-11 VITALS — BP 128/68 | HR 65 | Temp 98.1°F | Ht 64.0 in | Wt 145.4 lb

## 2022-06-11 DIAGNOSIS — R5383 Other fatigue: Secondary | ICD-10-CM

## 2022-06-11 DIAGNOSIS — E785 Hyperlipidemia, unspecified: Secondary | ICD-10-CM

## 2022-06-11 DIAGNOSIS — M25561 Pain in right knee: Secondary | ICD-10-CM

## 2022-06-11 DIAGNOSIS — E559 Vitamin D deficiency, unspecified: Secondary | ICD-10-CM | POA: Diagnosis not present

## 2022-06-11 DIAGNOSIS — F409 Phobic anxiety disorder, unspecified: Secondary | ICD-10-CM

## 2022-06-11 DIAGNOSIS — R7301 Impaired fasting glucose: Secondary | ICD-10-CM | POA: Diagnosis not present

## 2022-06-11 DIAGNOSIS — R4589 Other symptoms and signs involving emotional state: Secondary | ICD-10-CM | POA: Diagnosis not present

## 2022-06-11 DIAGNOSIS — I10 Essential (primary) hypertension: Secondary | ICD-10-CM

## 2022-06-11 DIAGNOSIS — M810 Age-related osteoporosis without current pathological fracture: Secondary | ICD-10-CM

## 2022-06-11 DIAGNOSIS — I700 Atherosclerosis of aorta: Secondary | ICD-10-CM

## 2022-06-11 DIAGNOSIS — H93A9 Pulsatile tinnitus, unspecified ear: Secondary | ICD-10-CM

## 2022-06-11 DIAGNOSIS — F5105 Insomnia due to other mental disorder: Secondary | ICD-10-CM

## 2022-06-11 DIAGNOSIS — M79671 Pain in right foot: Secondary | ICD-10-CM

## 2022-06-11 LAB — COMPREHENSIVE METABOLIC PANEL WITH GFR
ALT: 17 U/L (ref 0–35)
AST: 18 U/L (ref 0–37)
Albumin: 4.4 g/dL (ref 3.5–5.2)
Alkaline Phosphatase: 58 U/L (ref 39–117)
BUN: 12 mg/dL (ref 6–23)
CO2: 25 meq/L (ref 19–32)
Calcium: 9.1 mg/dL (ref 8.4–10.5)
Chloride: 104 meq/L (ref 96–112)
Creatinine, Ser: 0.67 mg/dL (ref 0.40–1.20)
GFR: 78.5 mL/min
Glucose, Bld: 98 mg/dL (ref 70–99)
Potassium: 4.4 meq/L (ref 3.5–5.1)
Sodium: 140 meq/L (ref 135–145)
Total Bilirubin: 0.5 mg/dL (ref 0.2–1.2)
Total Protein: 7 g/dL (ref 6.0–8.3)

## 2022-06-11 LAB — CBC WITH DIFFERENTIAL/PLATELET
Basophils Absolute: 0 10*3/uL (ref 0.0–0.1)
Basophils Relative: 0.6 % (ref 0.0–3.0)
Eosinophils Absolute: 0.2 10*3/uL (ref 0.0–0.7)
Eosinophils Relative: 3.5 % (ref 0.0–5.0)
HCT: 42.1 % (ref 36.0–46.0)
Hemoglobin: 14 g/dL (ref 12.0–15.0)
Lymphocytes Relative: 25.8 % (ref 12.0–46.0)
Lymphs Abs: 1.7 10*3/uL (ref 0.7–4.0)
MCHC: 33.2 g/dL (ref 30.0–36.0)
MCV: 90.7 fl (ref 78.0–100.0)
Monocytes Absolute: 0.4 10*3/uL (ref 0.1–1.0)
Monocytes Relative: 6.6 % (ref 3.0–12.0)
Neutro Abs: 4.2 10*3/uL (ref 1.4–7.7)
Neutrophils Relative %: 63.5 % (ref 43.0–77.0)
Platelets: 273 10*3/uL (ref 150.0–400.0)
RBC: 4.64 Mil/uL (ref 3.87–5.11)
RDW: 14.3 % (ref 11.5–15.5)
WBC: 6.6 10*3/uL (ref 4.0–10.5)

## 2022-06-11 LAB — LIPID PANEL
Cholesterol: 193 mg/dL (ref 0–200)
HDL: 95 mg/dL (ref >39.00)
LDL Cholesterol: 82 mg/dL (ref 0–99)
NonHDL: 98.2
Total CHOL/HDL Ratio: 2
Triglycerides: 83 mg/dL (ref 0.0–149.0)
VLDL: 16.6 mg/dL (ref 0.0–40.0)

## 2022-06-11 LAB — LDL CHOLESTEROL, DIRECT: Direct LDL: 75 mg/dL

## 2022-06-11 LAB — HEMOGLOBIN A1C: Hgb A1c MFr Bld: 5.5 % (ref 4.6–6.5)

## 2022-06-11 LAB — TSH: TSH: 2.3 u[IU]/mL (ref 0.35–5.50)

## 2022-06-11 LAB — VITAMIN D 25 HYDROXY (VIT D DEFICIENCY, FRACTURES): VITD: 34.03 ng/mL (ref 30.00–100.00)

## 2022-06-11 MED ORDER — ROSUVASTATIN CALCIUM 10 MG PO TABS: 10.0000 mg | ORAL_TABLET | Freq: Every day | ORAL | 1 refills | Status: DC

## 2022-06-11 MED ORDER — DENOSUMAB 60 MG/ML ~~LOC~~ SOSY
60.0000 mg | PREFILLED_SYRINGE | Freq: Once | SUBCUTANEOUS | Status: AC
Start: 2022-06-11 — End: 2022-06-11
  Administered 2022-06-11: 60 mg via SUBCUTANEOUS

## 2022-06-11 NOTE — Progress Notes (Signed)
Subjective:  Patient ID: Laura [name], female    DOB: [birth year]  Age: 89 y.o. MRN: [id number]  CC: The primary encounter diagnosis was Primary hypertension. Diagnoses of Hyperlipidemia LDL goal <70, Impaired fasting glucose, Fatigue, unspecified type, Age-related osteoporosis without current pathological fracture, Aortic atherosclerosis (HCC), Vitamin D deficiency, Pain of right midfoot, Anxiety about health, Right medial knee pain, Pulsatile tinnitus, and Insomnia due to anxiety and fear were also pertinent to this visit.   HPI Laura [name] presents for  Chief Complaint  Patient presents with  . Medical Management of Chronic Issues   1) right lateral  mid foot pain since returning home from [place] cruise.    No falls  no bruising.  Mild   2)  right knee crepitus and pain on medial side of knee , worse with walking.   Mild    3) wants to get off of atorvastatin due to concern about hair loss which has not responded to Rogaine,  she notes that her part has become wider   4)  ) worried about serum glucose of 66 in December.  Was not fasting.  Not asymptomatic , no history of prediabetes   5) insomnia: using ambien every night for chronic insomnia, has been using it for  nearly 40 years.       Outpatient Medications Prior to Visit  Medication Sig Dispense Refill  . amLODipine (NORVASC) 2.5 MG tablet TAKE 1 TABLET(2.5 MG) BY MOUTH DAILY 90 tablet 3  . calcium carbonate (OS-CAL) 600 MG TABS Take 600 mg by mouth 2 (two) times daily with a meal.    . Cholecalciferol (VITAMIN D3) 50 MCG (2000 UT) CAPS Take 1 capsule by mouth daily.    . Coenzyme Q10 (CO Q 10) 100 MG CAPS Take 1 capsule by mouth daily.    . Cyanocobalamin 2500 MCG SUBL PLACE 1 TABLET UNDER THE TONGUE DAILY AS DIRECTED 90 tablet 1  . denosumab (PROLIA) 60 MG/ML SOLN injection Inject 60 mg into the skin every 6 (six) months. Administer in upper arm, thigh, or abdomen    . escitalopram (LEXAPRO) 10 MG tablet TAKE 1  TABLET(10 MG) BY MOUTH DAILY 90 tablet 1  . magnesium oxide (MAG-OX) 400 MG tablet Take 400 mg by mouth 2 (two) times daily.    . Multiple Vitamins-Minerals (PRESERVISION AREDS 2 PO) Take 1 capsule by mouth daily.    . nitroGLYCERIN (NITROSTAT) 0.4 MG SL tablet Place 1 tablet (0.4 mg total) under the tongue every 5 (five) minutes as needed for chest pain. 50 tablet 3  . omeprazole (PRILOSEC) 20 MG capsule Take 1 capsule (20 mg total) by mouth daily. 30 capsule 3  . psyllium (METAMUCIL) 58.6 % packet Take 1 packet by mouth daily.    . Riboflavin 100 MG CAPS Take 1 capsule by mouth 2 (two) times daily.     Marland Kitchen zolpidem (AMBIEN) 10 MG tablet TAKE 1 TABLET BY MOUTH AT BEDTIME 30 tablet 5  . atorvastatin (LIPITOR) 20 MG tablet TAKE 1 TABLET(20 MG) BY MOUTH DAILY 90 tablet 3  . ALPRAZolam (XANAX) 0.5 MG tablet Take 0.5 tablets (0.25 mg total) by mouth 2 (two) times daily as needed for anxiety or sleep. (Patient not taking: Reported on 06/22/2021) 30 tablet 0   Facility-Administered Medications Prior to Visit  Medication Dose Route Frequency Provider Last Rate Last Admin  . denosumab (PROLIA) injection 60 mg  60 mg Subcutaneous Once Sherlene Shams, MD  Review of Systems;  Patient denies headache, fevers, malaise, unintentional weight loss, skin rash, eye pain, sinus congestion and sinus pain, sore throat, dysphagia,  hemoptysis , cough, dyspnea, wheezing, chest pain, palpitations, orthopnea, edema, abdominal pain, nausea, melena, diarrhea, constipation, flank pain, dysuria, hematuria, urinary  Frequency, nocturia, numbness, tingling, seizures,  Focal weakness, Loss of consciousness,  Tremor, insomnia, depression, anxiety, and suicidal ideation.      Objective:  BP 128/68   Pulse 65   Temp 98.1 F (36.7 C) (Oral)   Ht 5\' 4"  (1.626 m)   Wt 145 lb 6.4 oz (66 kg)   SpO2 97%   BMI 24.96 kg/m   BP Readings from Last 3 Encounters:  06/11/22 128/68  02/02/22 134/66  12/29/21 122/80    Wt  Readings from Last 3 Encounters:  06/11/22 145 lb 6.4 oz (66 kg)  02/02/22 145 lb 12.8 oz (66.1 kg)  12/29/21 144 lb 3.2 oz (65.4 kg)    Physical Exam Vitals reviewed.  Constitutional:      General: She is not in acute distress.    Appearance: Normal appearance. She is normal weight. She is not ill-appearing, toxic-appearing or diaphoretic.  HENT:     Head: Normocephalic.  Eyes:     General: No scleral icterus.       Right eye: No discharge.        Left eye: No discharge.     Conjunctiva/sclera: Conjunctivae normal.  Cardiovascular:     Rate and Rhythm: Normal rate and regular rhythm.     Heart sounds: Normal heart sounds.  Pulmonary:     Effort: Pulmonary effort is normal. No respiratory distress.     Breath sounds: Normal breath sounds.  Musculoskeletal:        General: Normal range of motion.     Right knee: Crepitus present. Tenderness present over the medial joint line. MCL laxity present.     Left knee: Normal.     Right foot: Prominent metatarsal heads and bony tenderness present.     Left foot: Normal.     Comments: Bony tenderness 5th MT head right foot  Right medial patella   Skin:    General: Skin is warm and dry.  Neurological:     General: No focal deficit present.     Mental Status: She is alert and oriented to person, place, and time. Mental status is at baseline.  Psychiatric:        Mood and Affect: Mood normal.        Behavior: Behavior normal.        Thought Content: Thought content normal.        Judgment: Judgment normal.   Lab Results  Component Value Date   HGBA1C 5.5 06/11/2022   HGBA1C 5.6 02/10/2019   HGBA1C 5.4 05/05/2014    Lab Results  Component Value Date   CREATININE 0.67 06/11/2022   CREATININE 0.65 12/29/2021   CREATININE 0.74 06/09/2021    Lab Results  Component Value Date   WBC 6.6 06/11/2022   HGB 14.0 06/11/2022   HCT 42.1 06/11/2022   PLT 273.0 06/11/2022   GLUCOSE 98 06/11/2022   CHOL 193 06/11/2022   TRIG 83.0  06/11/2022   HDL 95.00 06/11/2022   LDLDIRECT 75.0 06/11/2022   LDLCALC 82 06/11/2022   ALT 17 06/11/2022   AST 18 06/11/2022   NA 140 06/11/2022   K 4.4 06/11/2022   CL 104 06/11/2022   CREATININE 0.67 06/11/2022   BUN 12 06/11/2022  CO2 25 06/11/2022   TSH 2.30 06/11/2022   INR 1.1 06/17/2020   HGBA1C 5.5 06/11/2022   MICROALBUR <0.7 06/01/2020    US Carotid Duplex Bilateral  Result Date: 03/20/2022 CLINICAL DATA:  Pulsatile tinnitus for the past 30-40 years. History of hypertension and hyperlipidemia. Former smoker. EXAM: BILATERAL CAROTID DUPLEX ULTRASOUND TECHNIQUE: Wallace Cullens scale imaging, color Doppler and duplex ultrasound were performed of bilateral carotid and vertebral arteries in the neck. COMPARISON:  None Available. FINDINGS: Criteria: Quantification of carotid stenosis is based on velocity parameters that correlate the residual internal carotid diameter with NASCET-based stenosis levels, using the diameter of the distal internal carotid lumen as the denominator for stenosis measurement. The following velocity measurements were obtained: RIGHT ICA: 60/18 cm/sec CCA: 59/13 cm/sec SYSTOLIC ICA/CCA RATIO:  1.0 ECA: 76 cm/sec LEFT ICA: 61/20 cm/sec CCA: 67/14 cm/sec SYSTOLIC ICA/CCA RATIO:  0.9 ECA: 68 cm/sec RIGHT CAROTID ARTERY: There is a very minimal amount of hypoechoic plaque within the right carotid bulb (image 15), not resulting in elevated peak systolic velocities within the interrogated course of the right internal carotid artery to suggest a hemodynamically significant stenosis. RIGHT VERTEBRAL ARTERY:  Antegrade flow LEFT CAROTID ARTERY: There is a very minimal amount of intimal thickening involving the origin and proximal aspects of the left internal carotid artery (image 53), not resulting in elevated peak systolic velocities within the interrogated course of the left internal carotid artery to suggest a hemodynamically significant stenosis. LEFT VERTEBRAL ARTERY:  Antegrade  flow IMPRESSION: Very minimal amount of bilateral intimal wall thickening, not resulting in a hemodynamically significant stenosis within either internal carotid artery. Electronically Signed   By: Simonne Come M.D.   On: 03/20/2022 16:53    Assessment & Plan:  .Primary hypertension -     Comprehensive metabolic panel  Hyperlipidemia LDL goal <70 -     Lipid panel -     LDL cholesterol, direct  Impaired fasting glucose -     Comprehensive metabolic panel -     Hemoglobin A1c  Fatigue, unspecified type -     TSH -     CBC with Differential/Platelet  Age-related osteoporosis without current pathological fracture Assessment & Plan: Mnaged with Prolia ,  given today (May 2024) She has  no history of fractures. Her  T scores have improved into the osteopenia range.  Continue Prolia every 6 months calcium and Vitamin D supplementation  Last vitamin D Lab Results  Component Value Date   VD25OH 39 06/09/2021     Orders: -     Denosumab  Aortic atherosclerosis (HCC) Assessment & Plan: WE reviewed the incidental  findings of abd/pelvic CT scan done in 2022 to evaluate LLQ pain.  .  Patient has been  taking lipitor 20 mg  and  LDL is well below goal ; however she reqeusts change to Crestor due to hair loss   Lab Results  Component Value Date   CHOL 172 12/29/2021   HDL 99.30 12/29/2021   LDLCALC 59 12/29/2021   LDLDIRECT 109.7 09/27/2011   TRIG 67.0 12/29/2021   CHOLHDL 2 12/29/2021   Lab Results  Component Value Date   ALT 17 12/29/2021   AST 18 12/29/2021   ALKPHOS 55 12/29/2021   BILITOT 0.4 12/29/2021      Vitamin D deficiency -     VITAMIN D 25 Hydroxy (Vit-D Deficiency, Fractures)  Pain of right midfoot Assessment & Plan: Over the head of the  5th MT.   Mild.  No  workup requested.  Likely OA   Anxiety about health Assessment & Plan: She notes that she is feeling less anxious since starting Lexapro    Right medial knee pain Assessment & Plan: With  crepitus on exam .  No swelling or effusion.  Continue tylenol for now    Pulsatile tinnitus Assessment & Plan: She has a normal neurologic exam, intact TM;s,  and no carotid bruits. Normal  carotid duplex study    Insomnia due to anxiety and fear Assessment & Plan: She remains ambien dependent .  I have reviewed the dangers of prescribing this drug to the elderly with patient.  She has been [place] dependent for over five years, and prior attempts to use an alternative medication  have been unsuccessful resulting in loss of sleep.  She did not sleep  well during a trial of  reducing the dose to 5mg   And is willing to accept the risks of adverse events. Refilled today at the 10 mg dose    Other orders -     Rosuvastatin Calcium; Take 1 tablet (10 mg total) by mouth daily.  Dispense: 90 tablet; Refill: 1     I provided 28 minutes of face-to-face time during this encounter reviewing patient's last visit with me, patient's  most recent visit with  recent surgical and non surgical procedures, previous  labs and imaging studies, counseling on management of anxiety  about health , and  post visit ordering to diagnostics and therapeutics .   Follow-up: No follow-ups on file.   Sherlene Shams, MD

## 2022-06-11 NOTE — Patient Instructions (Addendum)
We are stopping atorvastatin and starting rosuvastatin   for aortic  atherosclerosis f and primary prevention  of stroke  and heart attack   You can add up to 2000 mg of acetominophen (tylenol) every day safely  In divided doses (500 mg every 6 hours  Or 1000 mg every 12 hours.)   Add advil or aleve if needed but limit use

## 2022-06-11 NOTE — Assessment & Plan Note (Signed)
She has a normal neurologic exam, intact TM;s,  and no carotid bruits. Normal  carotid duplex study

## 2022-06-11 NOTE — Assessment & Plan Note (Signed)
She notes that she is feeling less anxious since starting Lexapro

## 2022-06-11 NOTE — Assessment & Plan Note (Signed)
Over the head of the  5th MT.   Mild.  No workup requested.  Likely OA

## 2022-06-11 NOTE — Assessment & Plan Note (Signed)
Mnaged with Prolia ,  given today (May 2024) She has  no history of fractures. Her  T scores have improved into the osteopenia range.  Continue Prolia every 6 months calcium and Vitamin D supplementation  Last vitamin D Lab Results  Component Value Date   VD25OH 39 06/09/2021

## 2022-06-11 NOTE — Assessment & Plan Note (Addendum)
WE reviewed the incidental  findings of abd/pelvic CT scan done in 2022 to evaluate LLQ pain.  .  Patient has been  taking lipitor 20 mg  and  LDL is well below goal ; however she reqeusts change to Crestor due to hair loss   Lab Results  Component Value Date   CHOL 172 12/29/2021   HDL 99.30 12/29/2021   LDLCALC 59 12/29/2021   LDLDIRECT 109.7 09/27/2011   TRIG 67.0 12/29/2021   CHOLHDL 2 12/29/2021   Lab Results  Component Value Date   ALT 17 12/29/2021   AST 18 12/29/2021   ALKPHOS 55 12/29/2021   BILITOT 0.4 12/29/2021

## 2022-06-11 NOTE — Assessment & Plan Note (Signed)
With crepitus on exam .  No swelling or effusion.  Continue tylenol for now

## 2022-06-11 NOTE — Assessment & Plan Note (Signed)
She remains ambien dependent .  I have reviewed the dangers of prescribing this drug to the elderly with patient.  She has been ambien dependent for over five years, and prior attempts to use an alternative medication  have been unsuccessful resulting in loss of sleep.  She did not sleep  well during a trial of  reducing the dose to 5mg  And is willing to accept the risks of adverse events. Refilled today at the 10 mg dose  

## 2022-06-12 ENCOUNTER — Encounter: Payer: Self-pay | Admitting: Internal Medicine

## 2022-06-12 NOTE — Telephone Encounter (Signed)
See second mychart message.

## 2022-06-14 ENCOUNTER — Encounter: Payer: Self-pay | Admitting: Internal Medicine

## 2022-06-14 DIAGNOSIS — E559 Vitamin D deficiency, unspecified: Secondary | ICD-10-CM | POA: Insufficient documentation

## 2022-06-18 DIAGNOSIS — H2512 Age-related nuclear cataract, left eye: Secondary | ICD-10-CM | POA: Diagnosis not present

## 2022-06-19 ENCOUNTER — Encounter: Payer: Self-pay | Admitting: Ophthalmology

## 2022-06-19 NOTE — Anesthesia Preprocedure Evaluation (Addendum)
Anesthesia Evaluation  Patient identified by MRN, date of birth, ID band Patient awake    Reviewed: Allergy & Precautions, H&P , NPO status , Patient's Chart, lab work & pertinent test results  Airway Mallampati: IV  TM Distance: <3 FB Neck ROM: Full  Mouth opening: Limited Mouth Opening Comment: Patient has overbite, true Mallampati IV, short thyromental distance, limited oral opening, would likely be anterior airway if intubation were needed Dental no notable dental hx.    Pulmonary former smoker   Pulmonary exam normal breath sounds clear to auscultation       Cardiovascular hypertension, Normal cardiovascular exam Rhythm:Regular Rate:Normal     Neuro/Psych  Headaches PSYCHIATRIC DISORDERS Anxiety     Constant pulsatile tinnitus in both ears Wears hearing aids both ears  Neuromuscular disease  negative psych ROS   GI/Hepatic negative GI ROS, Neg liver ROS,,,  Endo/Other  negative endocrine ROS    Renal/GU negative Renal ROS  negative genitourinary   Musculoskeletal  (+) Arthritis ,    Abdominal   Peds negative pediatric ROS (+)  Hematology negative hematology ROS (+)   Anesthesia Other Findings Osteoporosis  Migraine with aura High cholesterol  Hypertension Carotid stenosis  Accidental fall Neuropathy  Anxiety Fracture of rib  COVID-19 virus infection Pulsatile tinnitus    Reproductive/Obstetrics negative OB ROS                             Anesthesia Physical Anesthesia Plan  ASA: 3  Anesthesia Plan: MAC   Post-op Pain Management:    Induction: Intravenous  PONV Risk Score and Plan:   Airway Management Planned: Natural Airway and Nasal Cannula  Additional Equipment:   Intra-op Plan:   Post-operative Plan:   Informed Consent: I have reviewed the patients History and Physical, chart, labs and discussed the procedure including the risks, benefits and alternatives  for the proposed anesthesia with the patient or authorized representative who has indicated his/her understanding and acceptance.     Dental Advisory Given  Plan Discussed with: Anesthesiologist, CRNA and Surgeon  Anesthesia Plan Comments: (Patient consented for risks of anesthesia including but not limited to:  - adverse reactions to medications - damage to eyes, teeth, lips or other oral mucosa - nerve damage due to positioning  - sore throat or hoarseness - Damage to heart, brain, nerves, lungs, other parts of body or loss of life  Patient voiced understanding.)       Anesthesia Quick Evaluation

## 2022-06-21 NOTE — Discharge Instructions (Signed)

## 2022-06-27 ENCOUNTER — Encounter
Admission: RE | Disposition: A | Discharge status: Home / Self Care | Payer: Self-pay | Source: Home / Self Care | Attending: Ophthalmology

## 2022-06-27 ENCOUNTER — Ambulatory Visit
Admission: RE | Admit: 2022-06-27 | Discharge: 2022-06-27 | Disposition: A | Discharge status: Home / Self Care | Payer: Medicare PPO | Attending: Ophthalmology | Admitting: Ophthalmology

## 2022-06-27 ENCOUNTER — Other Ambulatory Visit: Payer: Self-pay

## 2022-06-27 ENCOUNTER — Ambulatory Visit: Payer: Medicare PPO | Admitting: Anesthesiology

## 2022-06-27 ENCOUNTER — Encounter: Payer: Self-pay | Admitting: Ophthalmology

## 2022-06-27 DIAGNOSIS — Z87891 Personal history of nicotine dependence: Secondary | ICD-10-CM | POA: Diagnosis not present

## 2022-06-27 DIAGNOSIS — Z09 Encounter for follow-up examination after completed treatment for conditions other than malignant neoplasm: Secondary | ICD-10-CM | POA: Insufficient documentation

## 2022-06-27 DIAGNOSIS — I10 Essential (primary) hypertension: Secondary | ICD-10-CM | POA: Insufficient documentation

## 2022-06-27 DIAGNOSIS — H2512 Age-related nuclear cataract, left eye: Secondary | ICD-10-CM | POA: Diagnosis not present

## 2022-06-27 DIAGNOSIS — H269 Unspecified cataract: Secondary | ICD-10-CM | POA: Diagnosis not present

## 2022-06-27 HISTORY — PX: CATARACT EXTRACTION W/PHACO: SHX586

## 2022-06-27 SURGERY — PHACOEMULSIFICATION, CATARACT, WITH IOL INSERTION
Anesthesia: Monitor Anesthesia Care | Site: Eye | Laterality: Left

## 2022-06-27 MED ORDER — SIGHTPATH DOSE#1 BSS IO SOLN
INTRAOCULAR | Status: DC | PRN
  Administered 2022-06-27: 15 mL via INTRAOCULAR

## 2022-06-27 MED ORDER — SIGHTPATH DOSE#1 BSS IO SOLN
INTRAOCULAR | Status: DC | PRN
  Administered 2022-06-27: 2 mL

## 2022-06-27 MED ORDER — SIGHTPATH DOSE#1 NA HYALUR & NA CHOND-NA HYALUR IO KIT
PACK | INTRAOCULAR | Status: DC | PRN
  Administered 2022-06-27: 1 via OPHTHALMIC

## 2022-06-27 MED ORDER — LACTATED RINGERS IV SOLN: INTRAVENOUS | Status: DC

## 2022-06-27 MED ORDER — SIGHTPATH DOSE#1 BSS IO SOLN
INTRAOCULAR | Status: DC | PRN
  Administered 2022-06-27: 61 mL via OPHTHALMIC

## 2022-06-27 MED ORDER — TETRACAINE HCL 0.5 % OP SOLN
1.0000 [drp] | OPHTHALMIC | Status: DC | PRN
  Administered 2022-06-27 (×3): 1 [drp] via OPHTHALMIC

## 2022-06-27 MED ORDER — CEFUROXIME OPHTHALMIC INJECTION 1 MG/0.1 ML
INJECTION | OPHTHALMIC | Status: DC | PRN
  Administered 2022-06-27: .1 mL via INTRACAMERAL

## 2022-06-27 MED ORDER — MIDAZOLAM HCL 2 MG/2ML IJ SOLN
INTRAMUSCULAR | Status: DC | PRN
  Administered 2022-06-27: .5 mg via INTRAVENOUS

## 2022-06-27 MED ORDER — ARMC OPHTHALMIC DILATING DROPS
1.0000 | OPHTHALMIC | Status: DC | PRN
  Administered 2022-06-27 (×3): 1 via OPHTHALMIC

## 2022-06-27 MED ORDER — FENTANYL CITRATE (PF) 100 MCG/2ML IJ SOLN
INTRAMUSCULAR | Status: DC | PRN
  Administered 2022-06-27 (×2): 12.5 ug via INTRAVENOUS

## 2022-06-27 MED ORDER — BRIMONIDINE TARTRATE-TIMOLOL 0.2-0.5 % OP SOLN
OPHTHALMIC | Status: DC | PRN
  Administered 2022-06-27: 1 [drp] via OPHTHALMIC

## 2022-06-27 MED ORDER — SODIUM CHLORIDE 0.9% FLUSH
INTRAVENOUS | Status: DC | PRN
  Administered 2022-06-27: 10 mL via INTRAVENOUS

## 2022-06-27 SURGICAL SUPPLY — 11 items
CANNULA ANT/CHMB 27G (MISCELLANEOUS) IMPLANT
CANNULA ANT/CHMB 27GA (MISCELLANEOUS) IMPLANT
CATARACT SUITE SIGHTPATH (MISCELLANEOUS) ×1 IMPLANT
FEE CATARACT SUITE SIGHTPATH (MISCELLANEOUS) ×1 IMPLANT
GLOVE SRG 8 PF TXTR STRL LF DI (GLOVE) ×1 IMPLANT
GLOVE SURG ENC TEXT LTX SZ7.5 (GLOVE) ×1 IMPLANT
GLOVE SURG UNDER POLY LF SZ8 (GLOVE) ×1
LENS IOL TECNIS EYHANCE 21.5 (Intraocular Lens) IMPLANT
NDL FILTER BLUNT 18X1 1/2 (NEEDLE) ×1 IMPLANT
NEEDLE FILTER BLUNT 18X1 1/2 (NEEDLE) ×1 IMPLANT
SYR 3ML LL SCALE MARK (SYRINGE) ×1 IMPLANT

## 2022-06-27 NOTE — Anesthesia Postprocedure Evaluation (Signed)
Anesthesia Post Note  Patient: Laura [name]  Procedure(s) Performed: CATARACT EXTRACTION PHACO AND INTRAOCULAR LENS PLACEMENT (IOC) LEFT 6.36 00:43.1 (Left: Eye)  Patient location during evaluation: PACU Anesthesia Type: MAC Level of consciousness: awake and alert Pain management: pain level controlled Vital Signs Assessment: post-procedure vital signs reviewed and stable Respiratory status: spontaneous breathing, nonlabored ventilation, respiratory function stable and patient connected to nasal cannula oxygen Cardiovascular status: stable and blood pressure returned to baseline Postop Assessment: no apparent nausea or vomiting Anesthetic complications: no   No notable events documented.   Last Vitals:  Vitals:   06/27/22 0944 06/27/22 0950  BP: 130/67 120/69  Pulse: 61 69  Resp: 18 17  Temp: 36.5 C (!) 36.4 C  SpO2: 98% 97%    Last Pain:  Vitals:   06/27/22 0950  TempSrc:   PainSc: 0-No pain                 Marisue Humble

## 2022-06-27 NOTE — Transfer of Care (Signed)
Immediate Anesthesia Transfer of Care Note  Patient: Laura [name]  Procedure(s) Performed: CATARACT EXTRACTION PHACO AND INTRAOCULAR LENS PLACEMENT (IOC) LEFT 6.36 00:43.1 (Left: Eye)  Patient Location: PACU  Anesthesia Type:MAC  Level of Consciousness: awake, alert , and oriented  Airway & Oxygen Therapy: Patient Spontanous Breathing  Post-op Assessment: Report given to RN  Post vital signs: Reviewed and stable  Last Vitals:  Vitals Value Taken Time  BP 130/67 06/27/22 0945  Temp 97.7  06/27/22 0945   Pulse 62 06/27/22 0946  Resp 14 06/27/22 0946  SpO2 98 % 06/27/22 0946  Vitals shown include unvalidated device data.  Last Pain:  Vitals:   06/27/22 0817  TempSrc: Temporal  PainSc: 0-No pain      Patients Stated Pain Goal: 0 (06/27/22 0817)  Complications: No notable events documented.

## 2022-06-27 NOTE — H&P (Signed)
[facility]   Primary Care Physician:  Sherlene Shams, MD Ophthalmologist: Dr. Lockie Mola  Pre-Procedure History & Physical: HPI:  Laura [name] is a 89 y.o. female here for ophthalmic surgery.   Past Medical History:  Diagnosis Date   Accidental fall 10/16/2013   Anxiety    Carotid stenosis    Left    COVID-19 virus infection 09/14/2020   Fracture of rib 09/26/2019   High cholesterol    Hypertension    Pt states that her blood pressure is very well controlled   Migraine with aura    Neuropathy    Osteoporosis     Past Surgical History:  Procedure Laterality Date   ABDOMINAL HYSTERECTOMY     secondary to peritonitis and complications    APPENDECTOMY     TONSILLECTOMY     TUBAL LIGATION     at age 15, vaginal complicated by peritonitis    Prior to Admission medications   Medication Sig Start Date End Date Taking? Authorizing Provider  amLODipine (NORVASC) 2.5 MG tablet TAKE 1 TABLET(2.5 MG) BY MOUTH DAILY 02/28/22  Yes Sherlene Shams, MD  calcium carbonate (OS-CAL) 600 MG TABS Take 600 mg by mouth 2 (two) times daily with a meal.   Yes [provider]  Cholecalciferol (VITAMIN D3) 50 MCG (2000 UT) CAPS Take 1 capsule by mouth daily.   Yes [provider]  Cyanocobalamin 2500 MCG SUBL PLACE 1 TABLET UNDER THE TONGUE DAILY AS DIRECTED 06/07/16  Yes Sherlene Shams, MD  denosumab (PROLIA) 60 MG/ML SOLN injection Inject 60 mg into the skin every 6 (six) months. Administer in upper arm, thigh, or abdomen   Yes [provider]  escitalopram (LEXAPRO) 10 MG tablet TAKE 1 TABLET(10 MG) BY MOUTH DAILY 02/28/22  Yes Sherlene Shams, MD  magnesium oxide (MAG-OX) 400 MG tablet Take 400 mg by mouth 2 (two) times daily.   Yes [provider]  Multiple Vitamins-Minerals (PRESERVISION AREDS 2 PO) Take 1 capsule by mouth daily.   Yes [provider]  psyllium (METAMUCIL) 58.6 % packet Take 1 packet by mouth daily.   Yes  [provider]  Riboflavin 100 MG CAPS Take 1 capsule by mouth 2 (two) times daily.    Yes [provider]  rosuvastatin (CRESTOR) 10 MG tablet Take 1 tablet (10 mg total) by mouth daily. 06/11/22  Yes Sherlene Shams, MD  zolpidem (AMBIEN) 10 MG tablet TAKE 1 TABLET BY MOUTH AT BEDTIME 12/29/21  Yes Sherlene Shams, MD  ALPRAZolam Prudy Feeler) 0.5 MG tablet Take 0.5 tablets (0.25 mg total) by mouth 2 (two) times daily as needed for anxiety or sleep. Patient not taking: Reported on 06/22/2021 05/02/20   Sherlene Shams, MD  Coenzyme Q10 (CO Q 10) 100 MG CAPS Take 1 capsule by mouth daily. Patient not taking: Reported on 06/19/2022    [provider]  nitroGLYCERIN (NITROSTAT) 0.4 MG SL tablet Place 1 tablet (0.4 mg total) under the tongue every 5 (five) minutes as needed for chest pain. Patient not taking: Reported on 06/19/2022 08/26/15   Sherlene Shams, MD  omeprazole (PRILOSEC) 20 MG capsule Take 1 capsule (20 mg total) by mouth daily. Patient not taking: Reported on 06/19/2022 06/09/21   Sherlene Shams, MD    Allergies as of 04/04/2022 - Review Complete 02/02/2022  Allergen Reaction Noted   Erythromycin  03/21/2011    Family History  Adopted: Yes  Problem Relation Age of Onset  Breast cancer Mother 95   Cancer Daughter        Breast    Breast cancer Daughter 69   Breast cancer Sister 84       2 times    Social History   Socioeconomic History   Marital status: Widowed    Spouse name: Not on file   Number of children: 2   Years of education: college   Highest education level: Master's degree (e.g., MA, MS, MEng, MEd, MSW, MBA)  Occupational History   Occupation: Retired  Tobacco Use   Smoking status: Former    Types: Cigarettes    Quit date: 1965    Years since quitting: 59.4   Smokeless tobacco: Never  Substance and Sexual Activity   Alcohol use: Yes    Alcohol/week: 14.0 standard drinks of alcohol    Types: 14 Glasses of wine per week    Comment:  2 glasses of wine per day.   Drug use: No   Sexual activity: Never  Other Topics Concern   Not on file  Social History Narrative   Lives at home alone.   Left-handed.   2-4 cups caffeine per day.   Social Determinants of Health   Financial Resource Strain: Low Risk  (06/08/2022)   Overall Financial Resource Strain (CARDIA)    Difficulty of Paying Living Expenses: Not hard at all  Food Insecurity: No Food Insecurity (06/08/2022)   Hunger Vital Sign    Worried About Running Out of Food in the Last Year: Never true    Ran Out of Food in the Last Year: Never true  Transportation Needs: No Transportation Needs (06/08/2022)   PRAPARE - Administrator, Civil Service (Medical): No    Lack of Transportation (Non-Medical): No  Physical Activity: Insufficiently Active (06/08/2022)   Exercise Vital Sign    Days of Exercise per Week: 4 days    Minutes of Exercise per Session: 30 min  Stress: No Stress Concern Present (06/08/2022)   [employer] of Occupational Health - Occupational Stress Questionnaire    Feeling of Stress : Only a little  Social Connections: Unknown (06/08/2022)   Social Connection and Isolation Panel [NHANES]    Frequency of Communication with Friends and Family: More than three times a week    Frequency of Social Gatherings with Friends and Family: Twice a week    Attends Religious Services: Patient declined    Database administrator or Organizations: Yes    Attends Engineer, structural: More than 4 times per year    Marital Status: Widowed  Intimate Partner Violence: Not At Risk (11/24/2021)   Humiliation, Afraid, Rape, and Kick questionnaire    Fear of Current or Ex-Partner: No    Emotionally Abused: No    Physically Abused: No    Sexually Abused: No    Review of Systems: See HPI, otherwise negative ROS  Physical Exam: BP (!) 143/75   Pulse 83   Temp 98.1 F (36.7 C) (Temporal)   Resp 16   Ht 5\' 4"  (1.626 m)   Wt 65.8 kg   SpO2 96%    BMI 24.89 kg/m  General:   Alert,  pleasant and cooperative in NAD Head:  Normocephalic and atraumatic. Lungs:  Clear to auscultation.    Heart:  Regular rate and rhythm.   Impression/Plan: Laura [name] is here for ophthalmic surgery.  Risks, benefits, limitations, and alternatives regarding ophthalmic surgery have been reviewed with the patient.  Questions  have been answered.  All parties agreeable.   Lockie Mola, MD  06/27/2022, 8:40 AM

## 2022-06-27 NOTE — Op Note (Signed)
OPERATIVE NOTE  Laura [name] [id number] 06/27/2022   PREOPERATIVE DIAGNOSIS:  Nuclear sclerotic cataract left eye. H25.12   POSTOPERATIVE DIAGNOSIS:    Nuclear sclerotic cataract left eye.     PROCEDURE:  Phacoemusification with posterior chamber intraocular lens placement of the left eye  Ultrasound time: Procedure(s): CATARACT EXTRACTION PHACO AND INTRAOCULAR LENS PLACEMENT (IOC) LEFT 6.36 00:43.1 (Left)  LENS:   Implant Name Type Inv. Item Serial No. Manufacturer Lot No. LRB No. Used Action  LENS IOL TECNIS EYHANCE 21.5 - [id number] Intraocular Lens LENS IOL TECNIS EYHANCE 21.5 [id number] [facility]  Left 1 Wasted  LENS IOL TECNIS EYHANCE 21.5 - [id number] Intraocular Lens LENS IOL TECNIS EYHANCE 21.5 [id number] [employer]  Left 1 Implanted    First lens, injector malfunction.  Second IOL inserted.  SURGEON:  Deirdre Evener, MD   ANESTHESIA:  Topical with tetracaine drops and 2% Xylocaine jelly, augmented with 1% preservative-free intracameral lidocaine.    COMPLICATIONS:  None.   DESCRIPTION OF PROCEDURE:  The patient was identified in the holding room and transported to the operating room and placed in the supine position under the operating microscope.  The left eye was identified as the operative eye and it was prepped and draped in the usual sterile ophthalmic fashion.   A 1 millimeter clear-corneal paracentesis was made at the 1:30 position.  0.5 ml of preservative-free 1% lidocaine was injected into the anterior chamber.  The anterior chamber was filled with Viscoat viscoelastic.  A 2.4 millimeter keratome was used to make a near-clear corneal incision at the 10:30 position.  .  A curvilinear capsulorrhexis was made with a cystotome and capsulorrhexis forceps.  Balanced salt solution was used to hydrodissect and hydrodelineate the nucleus.   Phacoemulsification was then used in stop and chop fashion to remove the lens nucleus and epinucleus.  The remaining cortex  was then removed using the irrigation and aspiration handpiece. Provisc was then placed into the capsular bag to distend it for lens placement.  A lens was then injected into the capsular bag.  The remaining viscoelastic was aspirated.   Wounds were hydrated with balanced salt solution.  The anterior chamber was inflated to a physiologic pressure with balanced salt solution.  No wound leaks were noted. Cefuroxime 0.1 ml of a 10mg /ml solution was injected into the anterior chamber for a dose of 1 mg of intracameral antibiotic at the completion of the case.   Timolol and Brimonidine drops were applied to the eye.  The patient was taken to the recovery room in stable condition without complications of anesthesia or surgery.  Laura [name] 06/27/2022, 9:42 AM

## 2022-06-28 ENCOUNTER — Encounter: Payer: Self-pay | Admitting: Ophthalmology

## 2022-06-28 DIAGNOSIS — H2511 Age-related nuclear cataract, right eye: Secondary | ICD-10-CM | POA: Diagnosis not present

## 2022-07-03 ENCOUNTER — Encounter: Payer: Self-pay | Admitting: Ophthalmology

## 2022-07-03 NOTE — Anesthesia Preprocedure Evaluation (Addendum)
Anesthesia Evaluation  Patient identified by MRN, date of birth, ID band Patient awake    Reviewed: Allergy & Precautions, H&P , NPO status , Patient's Chart, lab work & pertinent test results  Airway Mallampati: IV  TM Distance: <3 FB Neck ROM: Full   Comment:   Mouth opening: Limited Mouth Opening Comment: Patient has overbite, true Mallampati IV, short thyromental distance, limited oral opening, would likely be anterior airway if intubation were needed  Dental no notable dental hx.    Pulmonary neg pulmonary ROS, former smoker   Pulmonary exam normal breath sounds clear to auscultation       Cardiovascular hypertension, negative cardio ROS Normal cardiovascular exam Rhythm:Regular Rate:Normal     Neuro/Psych  DISORDERS Anxiety     Constant pulsatile tinnitus in both ears Wears hearing aids both ears  negative neurological ROS  negative psych ROS   GI/Hepatic negative GI ROS, Neg liver ROS,,,  Endo/Other  negative endocrine ROS    Renal/GU negative Renal ROS  negative genitourinary   Musculoskeletal negative musculoskeletal ROS (+)    Abdominal   Peds negative pediatric ROS (+)  Hematology negative hematology ROS (+)   Anesthesia Other Findings   Reproductive/Obstetrics negative OB ROS                             Anesthesia Physical Anesthesia Plan  ASA: 3  Anesthesia Plan: MAC   Post-op Pain Management:    Induction: Intravenous  PONV Risk Score and Plan:   Airway Management Planned: Natural Airway and Nasal Cannula  Additional Equipment:   Intra-op Plan:   Post-operative Plan:   Informed Consent: I have reviewed the patients History and Physical, chart, labs and discussed the procedure including the risks, benefits and alternatives for the proposed anesthesia with the patient or authorized representative who has indicated his/her understanding and acceptance.      Dental Advisory Given  Plan Discussed with: Anesthesiologist, CRNA and Surgeon  Anesthesia Plan Comments: (Patient consented for risks of anesthesia including but not limited to:  - adverse reactions to medications - damage to eyes, teeth, lips or other oral mucosa - nerve damage due to positioning  - sore throat or hoarseness - Damage to heart, brain, nerves, lungs, other parts of body or loss of life  Patient voiced understanding.)        Anesthesia Quick Evaluation

## 2022-07-09 NOTE — Discharge Instructions (Signed)

## 2022-07-11 ENCOUNTER — Other Ambulatory Visit: Payer: Self-pay

## 2022-07-11 ENCOUNTER — Encounter
Admission: RE | Disposition: A | Discharge status: Home / Self Care | Payer: Self-pay | Source: Home / Self Care | Attending: Ophthalmology

## 2022-07-11 ENCOUNTER — Ambulatory Visit: Payer: Medicare PPO | Admitting: Anesthesiology

## 2022-07-11 ENCOUNTER — Encounter: Payer: Self-pay | Admitting: Ophthalmology

## 2022-07-11 ENCOUNTER — Ambulatory Visit
Admission: RE | Admit: 2022-07-11 | Discharge: 2022-07-11 | Disposition: A | Discharge status: Home / Self Care | Payer: Medicare PPO | Attending: Ophthalmology | Admitting: Ophthalmology

## 2022-07-11 DIAGNOSIS — E7800 Pure hypercholesterolemia, unspecified: Secondary | ICD-10-CM | POA: Diagnosis not present

## 2022-07-11 DIAGNOSIS — F419 Anxiety disorder, unspecified: Secondary | ICD-10-CM | POA: Diagnosis not present

## 2022-07-11 DIAGNOSIS — I10 Essential (primary) hypertension: Secondary | ICD-10-CM | POA: Diagnosis not present

## 2022-07-11 DIAGNOSIS — H269 Unspecified cataract: Secondary | ICD-10-CM | POA: Diagnosis not present

## 2022-07-11 DIAGNOSIS — Z87891 Personal history of nicotine dependence: Secondary | ICD-10-CM | POA: Insufficient documentation

## 2022-07-11 DIAGNOSIS — H2511 Age-related nuclear cataract, right eye: Secondary | ICD-10-CM | POA: Insufficient documentation

## 2022-07-11 HISTORY — PX: CATARACT EXTRACTION W/PHACO: SHX586

## 2022-07-11 SURGERY — PHACOEMULSIFICATION, CATARACT, WITH IOL INSERTION
Anesthesia: Monitor Anesthesia Care | Laterality: Right

## 2022-07-11 MED ORDER — SIGHTPATH DOSE#1 BSS IO SOLN
INTRAOCULAR | Status: DC | PRN
  Administered 2022-07-11: 2 mL

## 2022-07-11 MED ORDER — TETRACAINE HCL 0.5 % OP SOLN
1.0000 [drp] | OPHTHALMIC | Status: DC | PRN
  Administered 2022-07-11 (×3): 1 [drp] via OPHTHALMIC

## 2022-07-11 MED ORDER — BRIMONIDINE TARTRATE-TIMOLOL 0.2-0.5 % OP SOLN
OPHTHALMIC | Status: DC | PRN
  Administered 2022-07-11: 1 [drp] via OPHTHALMIC

## 2022-07-11 MED ORDER — FENTANYL CITRATE (PF) 100 MCG/2ML IJ SOLN
INTRAMUSCULAR | Status: DC | PRN
  Administered 2022-07-11: 25 ug via INTRAVENOUS

## 2022-07-11 MED ORDER — ARMC OPHTHALMIC DILATING DROPS
1.0000 | OPHTHALMIC | Status: DC | PRN
  Administered 2022-07-11 (×3): 1 via OPHTHALMIC

## 2022-07-11 MED ORDER — MIDAZOLAM HCL 2 MG/2ML IJ SOLN
INTRAMUSCULAR | Status: DC | PRN
  Administered 2022-07-11: .5 mg via INTRAVENOUS

## 2022-07-11 MED ORDER — SIGHTPATH DOSE#1 BSS IO SOLN
INTRAOCULAR | Status: DC | PRN
  Administered 2022-07-11: 55 mL via OPHTHALMIC

## 2022-07-11 MED ORDER — CEFUROXIME OPHTHALMIC INJECTION 1 MG/0.1 ML
INJECTION | OPHTHALMIC | Status: DC | PRN
  Administered 2022-07-11: 1 mg via INTRACAMERAL

## 2022-07-11 MED ORDER — SIGHTPATH DOSE#1 BSS IO SOLN
INTRAOCULAR | Status: DC | PRN
  Administered 2022-07-11: 15 mL via INTRAOCULAR

## 2022-07-11 MED ORDER — SIGHTPATH DOSE#1 NA HYALUR & NA CHOND-NA HYALUR IO KIT
PACK | INTRAOCULAR | Status: DC | PRN
  Administered 2022-07-11: 1

## 2022-07-11 SURGICAL SUPPLY — 9 items
CATARACT SUITE SIGHTPATH (MISCELLANEOUS) ×1 IMPLANT
FEE CATARACT SUITE SIGHTPATH (MISCELLANEOUS) ×1 IMPLANT
GLOVE SRG 8 PF TXTR STRL LF DI (GLOVE) ×1 IMPLANT
GLOVE SURG ENC TEXT LTX SZ7.5 (GLOVE) ×1 IMPLANT
GLOVE SURG UNDER POLY LF SZ8 (GLOVE) ×1
LENS IOL TECNIS EYHANCE 22.0 (Intraocular Lens) IMPLANT
NDL FILTER BLUNT 18X1 1/2 (NEEDLE) ×1 IMPLANT
NEEDLE FILTER BLUNT 18X1 1/2 (NEEDLE) ×1 IMPLANT
SYR 3ML LL SCALE MARK (SYRINGE) ×1 IMPLANT

## 2022-07-11 NOTE — Anesthesia Postprocedure Evaluation (Signed)
Anesthesia Post Note  Patient: Laura [name]  Procedure(s) Performed: CATARACT EXTRACTION PHACO AND INTRAOCULAR LENS PLACEMENT (IOC) RIGHT 8.35 00:33.9 (Right)  Patient location during evaluation: PACU Anesthesia Type: MAC Level of consciousness: awake and alert Pain management: pain level controlled Vital Signs Assessment: post-procedure vital signs reviewed and stable Respiratory status: spontaneous breathing, nonlabored ventilation, respiratory function stable and patient connected to nasal cannula oxygen Cardiovascular status: stable and blood pressure returned to baseline Postop Assessment: no apparent nausea or vomiting Anesthetic complications: no   No notable events documented.   Last Vitals:  Vitals:   07/11/22 1112 07/11/22 1114  BP:  120/70  Pulse:  66  Resp: 16 12  Temp: 36.7 C   SpO2:  95%    Last Pain:  Vitals:   07/11/22 1114  TempSrc:   PainSc: 0-No pain                 Nyna Chilton C Kostas Marrow

## 2022-07-11 NOTE — H&P (Signed)
[facility]   Primary Care Physician:  Sherlene Shams, MD Ophthalmologist: Dr. Lockie Mola  Pre-Procedure History & Physical: HPI:  Laura [name] is a 89 y.o. female here for ophthalmic surgery.   Past Medical History:  Diagnosis Date   Accidental fall 10/16/2013   Anxiety    Carotid stenosis    Left    COVID-19 virus infection 09/14/2020   Fracture of rib 09/26/2019   High cholesterol    Hypertension    Pt states that her blood pressure is very well controlled   Migraine with aura    Neuropathy    Osteoporosis     Past Surgical History:  Procedure Laterality Date   ABDOMINAL HYSTERECTOMY     secondary to peritonitis and complications    APPENDECTOMY     CATARACT EXTRACTION W/PHACO Left 06/27/2022   Procedure: CATARACT EXTRACTION PHACO AND INTRAOCULAR LENS PLACEMENT (IOC) LEFT 6.36 00:43.1;  Surgeon: Lockie Mola, MD;  Location: [facility];  Service: Ophthalmology;  Laterality: Left;   TONSILLECTOMY     TUBAL LIGATION     at age 7, vaginal complicated by peritonitis    Prior to Admission medications   Medication Sig Start Date End Date Taking? Authorizing Provider  amLODipine (NORVASC) 2.5 MG tablet TAKE 1 TABLET(2.5 MG) BY MOUTH DAILY 02/28/22  Yes Sherlene Shams, MD  calcium carbonate (OS-CAL) 600 MG TABS Take 600 mg by mouth 2 (two) times daily with a meal.   Yes [provider]  Cholecalciferol (VITAMIN D3) 50 MCG (2000 UT) CAPS Take 1 capsule by mouth daily.   Yes [provider]  Cyanocobalamin 2500 MCG SUBL PLACE 1 TABLET UNDER THE TONGUE DAILY AS DIRECTED 06/07/16  Yes Sherlene Shams, MD  denosumab (PROLIA) 60 MG/ML SOLN injection Inject 60 mg into the skin every 6 (six) months. Administer in upper arm, thigh, or abdomen   Yes [provider]  escitalopram (LEXAPRO) 10 MG tablet TAKE 1 TABLET(10 MG) BY MOUTH DAILY 02/28/22  Yes Sherlene Shams, MD  magnesium oxide (MAG-OX) 400 MG tablet Take 400 mg by  mouth 2 (two) times daily.   Yes [provider]  Multiple Vitamins-Minerals (PRESERVISION AREDS 2 PO) Take 1 capsule by mouth daily.   Yes [provider]  psyllium (METAMUCIL) 58.6 % packet Take 1 packet by mouth daily.   Yes [provider]  Riboflavin 100 MG CAPS Take 1 capsule by mouth 2 (two) times daily.    Yes [provider]  rosuvastatin (CRESTOR) 10 MG tablet Take 1 tablet (10 mg total) by mouth daily. 06/11/22  Yes Sherlene Shams, MD  zolpidem (AMBIEN) 10 MG tablet TAKE 1 TABLET BY MOUTH AT BEDTIME 12/29/21  Yes Sherlene Shams, MD  ALPRAZolam Prudy Feeler) 0.5 MG tablet Take 0.5 tablets (0.25 mg total) by mouth 2 (two) times daily as needed for anxiety or sleep. Patient not taking: Reported on 06/22/2021 05/02/20   Sherlene Shams, MD  Coenzyme Q10 (CO Q 10) 100 MG CAPS Take 1 capsule by mouth daily. Patient not taking: Reported on 06/19/2022    [provider]  nitroGLYCERIN (NITROSTAT) 0.4 MG SL tablet Place 1 tablet (0.4 mg total) under the tongue every 5 (five) minutes as needed for chest pain. Patient not taking: Reported on 06/19/2022 08/26/15   Sherlene Shams, MD  omeprazole (PRILOSEC) 20 MG capsule Take 1 capsule (20 mg total) by mouth daily. Patient not taking: Reported on 06/19/2022 06/09/21   Darrick Huntsman,  Mar Daring, MD    Allergies as of 04/04/2022 - Review Complete 02/02/2022  Allergen Reaction Noted   Erythromycin  03/21/2011    Family History  Adopted: Yes  Problem Relation Age of Onset   Breast cancer Mother 67   Cancer Daughter        Breast    Breast cancer Daughter 7   Breast cancer Sister 76       2 times    Social History   Socioeconomic History   Marital status: Widowed    Spouse name: Not on file   Number of children: 2   Years of education: college   Highest education level: Master's degree (e.g., MA, MS, MEng, MEd, MSW, MBA)  Occupational History   Occupation: Retired  Tobacco Use   Smoking status: Former     Types: Cigarettes    Quit date: 1965    Years since quitting: 59.4   Smokeless tobacco: Never  Substance and Sexual Activity   Alcohol use: Yes    Alcohol/week: 14.0 standard drinks of alcohol    Types: 14 Glasses of wine per week    Comment: 2 glasses of wine per day.   Drug use: No   Sexual activity: Never  Other Topics Concern   Not on file  Social History Narrative   Lives at home alone.   Left-handed.   2-4 cups caffeine per day.   Social Determinants of Health   Financial Resource Strain: Low Risk  (06/08/2022)   Overall Financial Resource Strain (CARDIA)    Difficulty of Paying Living Expenses: Not hard at all  Food Insecurity: No Food Insecurity (06/08/2022)   Hunger Vital Sign    Worried About Running Out of Food in the Last Year: Never true    Ran Out of Food in the Last Year: Never true  Transportation Needs: No Transportation Needs (06/08/2022)   PRAPARE - Administrator, Civil Service (Medical): No    Lack of Transportation (Non-Medical): No  Physical Activity: Insufficiently Active (06/08/2022)   Exercise Vital Sign    Days of Exercise per Week: 4 days    Minutes of Exercise per Session: 30 min  Stress: No Stress Concern Present (06/08/2022)   [employer] of Occupational Health - Occupational Stress Questionnaire    Feeling of Stress : Only a little  Social Connections: Unknown (06/08/2022)   Social Connection and Isolation Panel [NHANES]    Frequency of Communication with Friends and Family: More than three times a week    Frequency of Social Gatherings with Friends and Family: Twice a week    Attends Religious Services: Patient declined    Database administrator or Organizations: Yes    Attends Engineer, structural: More than 4 times per year    Marital Status: Widowed  Intimate Partner Violence: Not At Risk (11/24/2021)   Humiliation, Afraid, Rape, and Kick questionnaire    Fear of Current or Ex-Partner: No    Emotionally Abused:  No    Physically Abused: No    Sexually Abused: No    Review of Systems: See HPI, otherwise negative ROS  Physical Exam: BP 139/67   Pulse 67   Temp 98 F (36.7 C) (Temporal)   Resp 11   Ht 5' 4.02" (1.626 m)   Wt 65.7 kg   SpO2 97%   BMI 24.84 kg/m  General:   Alert,  pleasant and cooperative in NAD Head:  Normocephalic and atraumatic. Lungs:  Clear to  auscultation.    Heart:  Regular rate and rhythm.   Impression/Plan: Garvin Fila Mckenna is here for ophthalmic surgery.  Risks, benefits, limitations, and alternatives regarding ophthalmic surgery have been reviewed with the patient.  Questions have been answered.  All parties agreeable.   Lockie Mola, MD  07/11/2022, 10:09 AM

## 2022-07-11 NOTE — Op Note (Signed)
  LOCATION:  [facility]   PREOPERATIVE DIAGNOSIS:    Nuclear sclerotic cataract right eye. H25.11   POSTOPERATIVE DIAGNOSIS:  Nuclear sclerotic cataract right eye.     PROCEDURE:  Phacoemusification with posterior chamber intraocular lens placement of the right eye   ULTRASOUND TIME: Procedure(s): CATARACT EXTRACTION PHACO AND INTRAOCULAR LENS PLACEMENT (IOC) RIGHT 8.35 00:33.9 (Right)  LENS:   Implant Name Type Inv. Item Serial No. Manufacturer Lot No. LRB No. Used Action  LENS IOL TECNIS EYHANCE 22.0 - [id number] Intraocular Lens LENS IOL TECNIS EYHANCE 22.0 [id number] [employer]  Right 1 Implanted         SURGEON:  Deirdre Evener, MD   ANESTHESIA:  Topical with tetracaine drops and 2% Xylocaine jelly, augmented with 1% preservative-free intracameral lidocaine.    COMPLICATIONS:  None.   DESCRIPTION OF PROCEDURE:  The patient was identified in the holding room and transported to the operating room and placed in the supine position under the operating microscope.  The right eye was identified as the operative eye and it was prepped and draped in the usual sterile ophthalmic fashion.   A 1 millimeter clear-corneal paracentesis was made at the 12:00 position.  0.5 ml of preservative-free 1% lidocaine was injected into the anterior chamber. The anterior chamber was filled with Viscoat viscoelastic.  A 2.4 millimeter keratome was used to make a near-clear corneal incision at the 9:00 position.  A curvilinear capsulorrhexis was made with a cystotome and capsulorrhexis forceps.  Balanced salt solution was used to hydrodissect and hydrodelineate the nucleus.   Phacoemulsification was then used in stop and chop fashion to remove the lens nucleus and epinucleus.  The remaining cortex was then removed using the irrigation and aspiration handpiece. Provisc was then placed into the capsular bag to distend it for lens placement.  A lens was then injected into the capsular bag.   The remaining viscoelastic was aspirated.   Wounds were hydrated with balanced salt solution.  The anterior chamber was inflated to a physiologic pressure with balanced salt solution.  No wound leaks were noted. Cefuroxime 0.1 ml of a 10mg /ml solution was injected into the anterior chamber for a dose of 1 mg of intracameral antibiotic at the completion of the case.   Timolol and Brimonidine drops were applied to the eye.  The patient was taken to the recovery room in stable condition without complications of anesthesia or surgery.   Esthefany Herrig 07/11/2022, 11:09 AM

## 2022-07-11 NOTE — Transfer of Care (Signed)
Immediate Anesthesia Transfer of Care Note  Patient: Laura [name]  Procedure(s) Performed: CATARACT EXTRACTION PHACO AND INTRAOCULAR LENS PLACEMENT (IOC) RIGHT 8.35 00:33.9 (Right)  Patient Location: PACU  Anesthesia Type: MAC  Level of Consciousness: awake, alert  and patient cooperative  Airway and Oxygen Therapy: Patient Spontanous Breathing and Patient connected to supplemental oxygen  Post-op Assessment: Post-op Vital signs reviewed, Patient's Cardiovascular Status Stable, Respiratory Function Stable, Patent Airway and No signs of Nausea or vomiting  Post-op Vital Signs: Reviewed and stable  Complications: No notable events documented.

## 2022-07-12 ENCOUNTER — Encounter: Payer: Self-pay | Admitting: Ophthalmology

## 2022-07-13 ENCOUNTER — Other Ambulatory Visit: Payer: Self-pay | Admitting: Internal Medicine

## 2022-07-13 NOTE — Telephone Encounter (Signed)
Refilled: 12/29/2021 Last OV: 06/11/2022 Next OV: not scheduled

## 2022-09-17 ENCOUNTER — Other Ambulatory Visit: Payer: Self-pay | Admitting: Internal Medicine

## 2022-10-10 DIAGNOSIS — D2271 Melanocytic nevi of right lower limb, including hip: Secondary | ICD-10-CM | POA: Diagnosis not present

## 2022-10-10 DIAGNOSIS — L821 Other seborrheic keratosis: Secondary | ICD-10-CM | POA: Diagnosis not present

## 2022-10-10 DIAGNOSIS — D2261 Melanocytic nevi of right upper limb, including shoulder: Secondary | ICD-10-CM | POA: Diagnosis not present

## 2022-10-10 DIAGNOSIS — L218 Other seborrheic dermatitis: Secondary | ICD-10-CM | POA: Diagnosis not present

## 2022-10-10 DIAGNOSIS — L820 Inflamed seborrheic keratosis: Secondary | ICD-10-CM | POA: Diagnosis not present

## 2022-10-10 DIAGNOSIS — D2272 Melanocytic nevi of left lower limb, including hip: Secondary | ICD-10-CM | POA: Diagnosis not present

## 2022-10-10 DIAGNOSIS — L309 Dermatitis, unspecified: Secondary | ICD-10-CM | POA: Diagnosis not present

## 2022-10-10 DIAGNOSIS — L739 Follicular disorder, unspecified: Secondary | ICD-10-CM | POA: Diagnosis not present

## 2022-10-10 DIAGNOSIS — D485 Neoplasm of uncertain behavior of skin: Secondary | ICD-10-CM | POA: Diagnosis not present

## 2022-10-10 DIAGNOSIS — D225 Melanocytic nevi of trunk: Secondary | ICD-10-CM | POA: Diagnosis not present

## 2022-10-10 DIAGNOSIS — D2262 Melanocytic nevi of left upper limb, including shoulder: Secondary | ICD-10-CM | POA: Diagnosis not present

## 2022-11-20 ENCOUNTER — Telehealth: Payer: Self-pay | Admitting: *Deleted

## 2022-11-20 ENCOUNTER — Other Ambulatory Visit: Payer: Self-pay | Admitting: Internal Medicine

## 2022-11-20 NOTE — Telephone Encounter (Signed)
$  40 due; PA on file for Prolia Due on or after 12/12/22 Sent mychart message to schedule

## 2022-11-29 ENCOUNTER — Ambulatory Visit: Payer: Medicare PPO | Admitting: Emergency Medicine

## 2022-11-29 VITALS — Ht 64.0 in | Wt 144.0 lb

## 2022-11-29 DIAGNOSIS — Z0000 Encounter for general adult medical examination without abnormal findings: Secondary | ICD-10-CM | POA: Diagnosis not present

## 2022-11-29 NOTE — Patient Instructions (Addendum)
Laura [name] , Thank you for taking time to come for your Medicare Wellness Visit. I appreciate your ongoing commitment to your health goals. Please review the following plan we discussed and let me know if I can assist you in the future.   Referrals/Orders/Follow-Ups/Clinician Recommendations: I have made you an appointment with Dr. Darrick Huntsman on 01/09/23 @ 4pm (arrive 15 min early). Check with Dr. Darrick Huntsman about when you need another bone density test. You last one was 03/05/19, usually you would repeat in 2 years, but this may change since you are on Prolia.  This is a list of the screening recommended for you and due dates:  Health Maintenance  Topic Date Due   Mammogram  03/25/2021   COVID-19 Vaccine (6 - 2023-24 season) 09/30/2022   DTaP/Tdap/Td vaccine (2 - Td or Tdap) 02/20/2023   Medicare Annual Wellness Visit  11/29/2023   Pneumonia Vaccine  Completed   Flu Shot  Completed   DEXA scan (bone density measurement)  Completed   Zoster (Shingles) Vaccine  Completed   HPV Vaccine  Aged Out    Advanced directives: (Copy Requested) Please bring a copy of your health care power of attorney and living will to the office to be added to your chart at your convenience.  Next Medicare Annual Wellness Visit scheduled for next year: Yes, 12/04/23 @ 3:00pm

## 2022-11-29 NOTE — Progress Notes (Signed)
Subjective:   Laura [name] is a 89 y.o. female who presents for Medicare Annual (Subsequent) preventive examination.  Visit Complete: Virtual I connected with  Laura [name] on 11/29/22 by a audio enabled telemedicine application and verified that I am speaking with the correct person using two identifiers.  Patient Location: Home  Provider Location: Home Office  I discussed the limitations of evaluation and management by telemedicine. The patient expressed understanding and agreed to proceed.  Vital Signs: Because this visit was a virtual/telehealth visit, some criteria may be missing or patient reported. Any vitals not documented were not able to be obtained and vitals that have been documented are patient reported.  Patient Medicare AWV questionnaire was completed by the patient on 11/27/22; I have confirmed that all information answered by patient is correct and no changes since this date.  Cardiac Risk Factors include: advanced age (>90men, >39 women);dyslipidemia;hypertension     Objective:    Today's Vitals   11/29/22 1245  Weight: 144 lb (65.3 kg)  Height: 5\' 4"  (1.626 m)   Body mass index is 24.72 kg/m.     11/29/2022   12:57 PM 07/11/2022    9:54 AM 06/27/2022    8:15 AM 11/24/2021    9:29 AM 07/05/2021    4:34 PM 01/26/2021    4:00 PM 06/17/2020   12:25 PM  Advanced Directives  Does Patient Have a Medical Advance Directive? Yes Yes Yes Yes Yes Yes Yes  Type of Estate agent of [place];Living will Living will Healthcare Power of [place];Living will Healthcare Power of [place];Living will Living will  Healthcare Power of [place];Living will  Does patient want to make changes to medical advance directive? No - Patient declined No - Patient declined No - Patient declined No - Patient declined No - Patient declined No - Patient declined   Copy of Healthcare Power of Attorney in Chart? No - copy requested  No - copy requested No - copy  requested       Current Medications (verified) Outpatient Encounter Medications as of 11/29/2022  Medication Sig   amLODipine (NORVASC) 2.5 MG tablet TAKE 1 TABLET(2.5 MG) BY MOUTH DAILY   calcium carbonate (OS-CAL) 600 MG TABS Take 600 mg by mouth 2 (two) times daily with a meal.   Cholecalciferol (VITAMIN D3) 50 MCG (2000 UT) CAPS Take 1 capsule by mouth daily.   Coenzyme Q10 (CO Q 10) 100 MG CAPS Take 1 capsule by mouth daily.   Cyanocobalamin 2500 MCG SUBL PLACE 1 TABLET UNDER THE TONGUE DAILY AS DIRECTED   denosumab (PROLIA) 60 MG/ML SOLN injection Inject 60 mg into the skin every 6 (six) months. Administer in upper arm, thigh, or abdomen   escitalopram (LEXAPRO) 10 MG tablet TAKE 1 TABLET(10 MG) BY MOUTH DAILY   hydrocortisone 2.5 % cream Apply 1 Application topically.   magnesium oxide (MAG-OX) 400 MG tablet Take 400 mg by mouth 2 (two) times daily.   Multiple Vitamins-Minerals (PRESERVISION AREDS 2 PO) Take 1 capsule by mouth daily.   psyllium (METAMUCIL) 58.6 % packet Take 1 packet by mouth daily.   Riboflavin 100 MG CAPS Take 1 capsule by mouth 2 (two) times daily.    rosuvastatin (CRESTOR) 10 MG tablet TAKE 1 TABLET(10 MG) BY MOUTH DAILY   zolpidem (AMBIEN) 10 MG tablet TAKE 1 TABLET BY MOUTH AT BEDTIME   ALPRAZolam (XANAX) 0.5 MG tablet Take 0.5 tablets (0.25 mg total) by mouth 2 (two) times daily as needed for anxiety  or sleep. (Patient not taking: Reported on 06/22/2021)   nitroGLYCERIN (NITROSTAT) 0.4 MG SL tablet Place 1 tablet (0.4 mg total) under the tongue every 5 (five) minutes as needed for chest pain. (Patient not taking: Reported on 06/19/2022)   omeprazole (PRILOSEC) 20 MG capsule Take 1 capsule (20 mg total) by mouth daily. (Patient not taking: Reported on 11/29/2022)   Facility-Administered Encounter Medications as of 11/29/2022  Medication   denosumab (PROLIA) injection 60 mg    Allergies (verified) Erythromycin   History: Past Medical History:  Diagnosis  Date   Accidental fall 10/16/2013   Anxiety    Carotid stenosis    Left    COVID-19 virus infection 09/14/2020   Fracture of rib 09/26/2019   High cholesterol    Hypertension    Pt states that her blood pressure is very well controlled   Migraine with aura    Neuropathy    Osteoporosis    Past Surgical History:  Procedure Laterality Date   ABDOMINAL HYSTERECTOMY     secondary to peritonitis and complications    APPENDECTOMY     CATARACT EXTRACTION W/PHACO Left 06/27/2022   Procedure: CATARACT EXTRACTION PHACO AND INTRAOCULAR LENS PLACEMENT (IOC) LEFT 6.36 00:43.1;  Surgeon: Lockie Mola, MD;  Location: [facility];  Service: Ophthalmology;  Laterality: Left;   CATARACT EXTRACTION W/PHACO Right 07/11/2022   Procedure: CATARACT EXTRACTION PHACO AND INTRAOCULAR LENS PLACEMENT (IOC) RIGHT 8.35 00:33.9;  Surgeon: Lockie Mola, MD;  Location: [facility];  Service: Ophthalmology;  Laterality: Right;   TONSILLECTOMY     TUBAL LIGATION     at age 69, vaginal complicated by peritonitis   Family History  Adopted: Yes  Problem Relation Age of Onset   Breast cancer Mother 76   Breast cancer Sister 62       2 times   Cancer Daughter        Breast    Breast cancer Daughter 43   Social History   Socioeconomic History   Marital status: Widowed    Spouse name: Not on file   Number of children: 2   Years of education: college   Highest education level: Master's degree (e.g., MA, MS, MEng, MEd, MSW, MBA)  Occupational History   Occupation: Retired  Tobacco Use   Smoking status: Former    Current packs/day: 0.00    Average packs/day: 1 pack/day for 9.0 years (9.0 ttl pk-yrs)    Types: Cigarettes    Start date: 44    Quit date: 1965    Years since quitting: 59.8   Smokeless tobacco: Never  Vaping Use   Vaping status: Never Used  Substance and Sexual Activity   Alcohol use: Yes    Alcohol/week: 14.0 standard drinks of alcohol    Types: 14 Glasses  of wine per week    Comment: 2 glasses of wine per day.   Drug use: No   Sexual activity: Never  Other Topics Concern   Not on file  Social History Narrative   Lives at home alone.   Left-handed.   2-4 cups caffeine per day.   Social Determinants of Health   Financial Resource Strain: Low Risk  (11/27/2022)   Overall Financial Resource Strain (CARDIA)    Difficulty of Paying Living Expenses: Not hard at all  Food Insecurity: No Food Insecurity (11/27/2022)   Hunger Vital Sign    Worried About Running Out of Food in the Last Year: Never true    Ran Out of Food  in the Last Year: Never true  Transportation Needs: No Transportation Needs (11/27/2022)   PRAPARE - Administrator, Civil Service (Medical): No    Lack of Transportation (Non-Medical): No  Physical Activity: Sufficiently Active (11/29/2022)   Exercise Vital Sign    Days of Exercise per Week: 5 days    Minutes of Exercise per Session: 60 min  Stress: No Stress Concern Present (11/27/2022)   [employer] of Occupational Health - Occupational Stress Questionnaire    Feeling of Stress : Not at all  Social Connections: Moderately Isolated (11/27/2022)   Social Connection and Isolation Panel [NHANES]    Frequency of Communication with Friends and Family: More than three times a week    Frequency of Social Gatherings with Friends and Family: Once a week    Attends Religious Services: Never    Database administrator or Organizations: Yes    Attends Engineer, structural: More than 4 times per year    Marital Status: Widowed    Tobacco Counseling Counseling given: Not Answered   Clinical Intake:  Pre-visit preparation completed: Yes  Pain : No/denies pain     BMI - recorded: 24.72 Nutritional Status: BMI of 19-24  Normal Diabetes: No  How often do you need to have someone help you when you read instructions, pamphlets, or other written materials from your doctor or pharmacy?: 1 -  Never  Interpreter Needed?: No  Information entered by :: Tora Kindred, CMA   Activities of Daily Living    11/27/2022    8:21 AM 07/11/2022    9:55 AM  In your present state of health, do you have any difficulty performing the following activities:  Hearing? 1 0  Comment wears hearing aids   Vision? 0 0  Difficulty concentrating or making decisions? 0 0  Walking or climbing stairs? 0 0  Dressing or bathing? 0 0  Doing errands, shopping? 0   Preparing Food and eating ? N   Using the Toilet? N   In the past six months, have you accidently leaked urine? N   Do you have problems with loss of bowel control? N   Managing your Medications? N   Managing your Finances? N   Housekeeping or managing your Housekeeping? N     Patient Care Team: Sherlene Shams, MD as PCP - General (Internal Medicine) Sherlene Shams, MD (Internal Medicine) Lockie Mola, MD as Referring Physician (Ophthalmology)  Indicate any recent Medical Services you may have received from other than [facility] providers in the past year (date may be approximate).     Assessment:   This is a routine wellness examination for Celinda.  Hearing/Vision screen Hearing Screening - Comments:: Wears hearing aids Vision Screening - Comments:: Gets eye exams   Goals Addressed               This Visit's Progress     Patient Stated (pt-stated)        Lose 6 pounds      Depression Screen    11/29/2022   12:55 PM 02/02/2022    2:33 PM 12/31/2021    2:11 PM 12/29/2021    8:10 AM 11/24/2021    9:28 AM 06/22/2021    8:12 AM 06/09/2021    2:47 PM  PHQ 2/9 Scores  PHQ - 2 Score 0 0 0 0 0 0 0  PHQ- 9 Score 0 0 0        Fall Risk  11/27/2022    8:21 AM 06/11/2022   11:40 AM 12/29/2021    8:10 AM 11/24/2021    9:31 AM 06/22/2021    8:12 AM  Fall Risk   Falls in the past year? 0 0 1 0 0  Number falls in past yr: 0 0 1 0   Injury with Fall? 0 0 0 0   Risk for fall due to : No Fall Risks No Fall Risks History  of fall(s) No Fall Risks No Fall Risks  Follow up Falls prevention discussed Falls evaluation completed Falls evaluation completed Falls evaluation completed Falls evaluation completed    MEDICARE RISK AT HOME: Medicare Risk at Home Any stairs in or around the home?: Yes If so, are there any without handrails?: No Home free of loose throw rugs in walkways, pet beds, electrical cords, etc?: Yes Adequate lighting in your home to reduce risk of falls?: Yes Life alert?: No Use of a cane, walker or w/c?: No Grab bars in the bathroom?: Yes Shower chair or bench in shower?: Yes Elevated toilet seat or a handicapped toilet?: Yes  TIMED UP AND GO:  Was the test performed?  No    Cognitive Function:    11/08/2017    3:57 PM 06/05/2016    9:35 AM  MMSE - Mini Mental State Exam  Orientation to time 5 5  Orientation to Place 5 5  Registration 3 3  Attention/ Calculation 5 5  Recall 3 3  Language- name 2 objects 2 2  Language- repeat 1 1  Language- follow 3 step command 3 3  Language- read & follow direction 1 1  Write a sentence 1 1  Copy design 1 1  Total score 30 30        11/29/2022   12:58 PM 11/24/2021    9:25 AM  6CIT Screen  What Year? 0 points 0 points  What month? 0 points 0 points  What time? 0 points 0 points  Count back from 20 0 points   Months in reverse 0 points 0 points  Repeat phrase 0 points   Total Score 0 points     Immunizations Immunization History  Administered Date(s) Administered   Fluad Quad(high Dose 65+) 10/10/2018, 11/04/2019, 11/21/2021   Influenza, High Dose Seasonal PF 10/27/2014, 10/05/2015, 10/24/2016, 10/30/2017   Influenza,inj,Quad PF,6+ Mos 10/17/2012, 10/30/2013   Influenza-Unspecified 12/01/2020   PFIZER(Purple Top)SARS-COV-2 Vaccination 02/05/2019, 02/26/2019, 10/22/2019, 04/28/2020   PNEUMOCOCCAL CONJUGATE-20 12/29/2021   Pfizer(Comirnaty)Fall Seasonal Vaccine 12 years and older 11/06/2021   Pneumococcal Conjugate-13 10/30/2013    Pneumococcal Polysaccharide-23 12/30/2010   Tdap 02/19/2013   Zoster Recombinant(Shingrix) 05/14/2017, 07/31/2017   Zoster, Live 01/01/2008    TDAP status: Up to date  Flu Vaccine status: Up to date  Pneumococcal vaccine status: Up to date  Covid-19 vaccine status: Completed vaccines per patient on 11/15/22  Qualifies for Shingles Vaccine? Yes   Zostavax completed No   Shingrix Completed?: Yes  Screening Tests Health Maintenance  Topic Date Due   MAMMOGRAM  03/25/2021   INFLUENZA VACCINE  08/30/2022   COVID-19 Vaccine (6 - 2023-24 season) 09/30/2022   DTaP/Tdap/Td (2 - Td or Tdap) 02/20/2023   Medicare Annual Wellness (AWV)  11/29/2023   Pneumonia Vaccine 34+ Years old  Completed   DEXA SCAN  Completed   Zoster Vaccines- Shingrix  Completed   HPV VACCINES  Aged Out    Health Maintenance  Health Maintenance Due  Topic Date Due   MAMMOGRAM  03/25/2021  INFLUENZA VACCINE  08/30/2022   COVID-19 Vaccine (6 - 2023-24 season) 09/30/2022    Colorectal cancer screening: No longer required.   Mammogram status: No longer required due to age.  Bone Density status: Completed 03/05/19. Results reflect: Bone density results: OSTEOPOROSIS. Repeat every 2 years.  Lung Cancer Screening: (Low Dose CT Chest recommended if Age 40-80 years, 20 pack-year currently smoking OR have quit w/in 15years.) does not qualify.   Lung Cancer Screening Referral: n/a  Additional Screening:  Hepatitis C Screening: does not qualify;   Vision Screening: Recommended annual ophthalmology exams for early detection of glaucoma and other disorders of the eye. Dental Screening: Recommended annual dental exams for proper oral hygiene   Community Resource Referral / Chronic Care Management: CRR required this visit?  No   CCM required this visit?  No     Plan:     I have personally reviewed and noted the following in the patient's chart:   Medical and social history Use of alcohol, tobacco  or illicit drugs  Current medications and supplements including opioid prescriptions. Patient is not currently taking opioid prescriptions. Functional ability and status Nutritional status Physical activity Advanced directives List of other physicians Hospitalizations, surgeries, and ER visits in previous 12 months Vitals Screenings to include cognitive, depression, and falls Referrals and appointments  In addition, I have reviewed and discussed with patient certain preventive protocols, quality metrics, and best practice recommendations. A written personalized care plan for preventive services as well as general preventive health recommendations were provided to patient.     Tora Kindred, CMA   11/29/2022   After Visit Summary: (MyChart) Due to this being a telephonic visit, the after visit summary with patients personalized plan was offered to patient via MyChart   Nurse Notes:  Made a f/u OV for 01/09/23 @ 4pm. Patient last seen 05/2022. Patient's last DEXA scan was 03/05/19 penia. Patient is on Prolia.

## 2022-12-10 MED ORDER — DENOSUMAB 60 MG/ML ~~LOC~~ SOSY
60.0000 mg | PREFILLED_SYRINGE | Freq: Once | SUBCUTANEOUS | Status: AC
Start: 2022-12-11 — End: 2022-12-12
  Administered 2022-12-12: 60 mg via SUBCUTANEOUS

## 2022-12-10 NOTE — Addendum Note (Signed)
Addended by: Warden Fillers on: 12/10/2022 10:23 AM   Modules accepted: Orders

## 2022-12-12 ENCOUNTER — Ambulatory Visit (INDEPENDENT_AMBULATORY_CARE_PROVIDER_SITE_OTHER): Payer: Medicare PPO

## 2022-12-12 DIAGNOSIS — M810 Age-related osteoporosis without current pathological fracture: Secondary | ICD-10-CM

## 2022-12-12 MED ORDER — DENOSUMAB 60 MG/ML ~~LOC~~ SOSY
60.0000 mg | PREFILLED_SYRINGE | Freq: Once | SUBCUTANEOUS | Status: AC
Start: 2023-06-11 — End: 2023-06-11
  Administered 2023-06-11: 60 mg via SUBCUTANEOUS

## 2022-12-12 NOTE — Progress Notes (Signed)
Patient presented for Prolia injection to her Right deltoid, patient voiced no concerns nor showed any signs of distress during injection

## 2023-01-09 ENCOUNTER — Ambulatory Visit: Payer: Medicare PPO | Admitting: Internal Medicine

## 2023-01-09 ENCOUNTER — Encounter: Payer: Self-pay | Admitting: Internal Medicine

## 2023-01-09 VITALS — BP 136/70 | HR 88 | Ht 64.0 in | Wt 149.6 lb

## 2023-01-09 DIAGNOSIS — F5105 Insomnia due to other mental disorder: Secondary | ICD-10-CM

## 2023-01-09 DIAGNOSIS — E559 Vitamin D deficiency, unspecified: Secondary | ICD-10-CM | POA: Diagnosis not present

## 2023-01-09 DIAGNOSIS — F411 Generalized anxiety disorder: Secondary | ICD-10-CM

## 2023-01-09 DIAGNOSIS — I700 Atherosclerosis of aorta: Secondary | ICD-10-CM | POA: Diagnosis not present

## 2023-01-09 DIAGNOSIS — R7301 Impaired fasting glucose: Secondary | ICD-10-CM

## 2023-01-09 DIAGNOSIS — I10 Essential (primary) hypertension: Secondary | ICD-10-CM

## 2023-01-09 DIAGNOSIS — E785 Hyperlipidemia, unspecified: Secondary | ICD-10-CM | POA: Diagnosis not present

## 2023-01-09 DIAGNOSIS — M810 Age-related osteoporosis without current pathological fracture: Secondary | ICD-10-CM

## 2023-01-09 DIAGNOSIS — R944 Abnormal results of kidney function studies: Secondary | ICD-10-CM

## 2023-01-09 DIAGNOSIS — M7062 Trochanteric bursitis, left hip: Secondary | ICD-10-CM | POA: Diagnosis not present

## 2023-01-09 DIAGNOSIS — Z1231 Encounter for screening mammogram for malignant neoplasm of breast: Secondary | ICD-10-CM

## 2023-01-09 MED ORDER — ESCITALOPRAM OXALATE 10 MG PO TABS: ORAL_TABLET | ORAL | 1 refills | Status: DC

## 2023-01-09 MED ORDER — AMLODIPINE BESYLATE 2.5 MG PO TABS: ORAL_TABLET | ORAL | 3 refills | Status: DC

## 2023-01-09 MED ORDER — ZOLPIDEM TARTRATE 10 MG PO TABS: ORAL_TABLET | ORAL | 5 refills | Status: DC

## 2023-01-09 MED ORDER — OMEPRAZOLE 20 MG PO CPDR: 20.0000 mg | DELAYED_RELEASE_CAPSULE | Freq: Every day | ORAL | 3 refills | Status: DC

## 2023-01-09 NOTE — Progress Notes (Unsigned)
Subjective:  Patient ID: Laura [name], female    DOB: 1932-04-07  Age: 89 y.o. MRN: [id number]  CC: The primary encounter diagnosis was Primary hypertension. Diagnoses of Hyperlipidemia LDL goal <70, Impaired fasting glucose, Vitamin D deficiency, and Encounter for screening mammogram for malignant neoplasm of breast were also pertinent to this visit.   HPI Laura [name] presents for  Chief Complaint  Patient presents with   Medical Management of Chronic Issues    6 month follow up    1) Right eye cataract extraction in June ([place]) vison greatly improved but still needs reading glasses.   2) Osteoporosis :   Prolia injections since 2017  t scores improved   her last DEXA  was 2021   3) General :  feels great ,  walking for exercis,e  doing yoga, practices rising from a seated chair  (48 reps/day ) holidg 2 lb hadnbells    4) Insomnia:  remains ambien dependent .  Nocturia  x 1  no falls. .   4) tak ing vitamin d  not sure of dose.           Outpatient Medications Prior to Visit  Medication Sig Dispense Refill   ALPRAZolam (XANAX) 0.5 MG tablet Take 0.5 tablets (0.25 mg total) by mouth 2 (two) times daily as needed for anxiety or sleep. 30 tablet 0   amLODipine (NORVASC) 2.5 MG tablet TAKE 1 TABLET(2.5 MG) BY MOUTH DAILY 90 tablet 3   calcium carbonate (OS-CAL) 600 MG TABS Take 600 mg by mouth 2 (two) times daily with a meal.     Cholecalciferol (VITAMIN D3) 50 MCG (2000 UT) CAPS Take 1 capsule by mouth daily.     Coenzyme Q10 (CO Q 10) 100 MG CAPS Take 1 capsule by mouth daily.     Cyanocobalamin 2500 MCG SUBL PLACE 1 TABLET UNDER THE TONGUE DAILY AS DIRECTED 90 tablet 1   denosumab (PROLIA) 60 MG/ML SOLN injection Inject 60 mg into the skin every 6 (six) months. Administer in upper arm, thigh, or abdomen     escitalopram (LEXAPRO) 10 MG tablet TAKE 1 TABLET(10 MG) BY MOUTH DAILY 90 tablet 1   hydrocortisone 2.5 % cream Apply 1 Application topically.      magnesium oxide (MAG-OX) 400 MG tablet Take 400 mg by mouth 2 (two) times daily.     Multiple Vitamins-Minerals (PRESERVISION AREDS 2 PO) Take 1 capsule by mouth daily.     nitroGLYCERIN (NITROSTAT) 0.4 MG SL tablet Place 1 tablet (0.4 mg total) under the tongue every 5 (five) minutes as needed for chest pain. 50 tablet 3   omeprazole (PRILOSEC) 20 MG capsule Take 1 capsule (20 mg total) by mouth daily. 30 capsule 3   psyllium (METAMUCIL) 58.6 % packet Take 1 packet by mouth daily.     Riboflavin 100 MG CAPS Take 1 capsule by mouth 2 (two) times daily.      rosuvastatin (CRESTOR) 10 MG tablet TAKE 1 TABLET(10 MG) BY MOUTH DAILY 90 tablet 1   zolpidem (AMBIEN) 10 MG tablet TAKE 1 TABLET BY MOUTH AT BEDTIME 30 tablet 5   Facility-Administered Medications Prior to Visit  Medication Dose Route Frequency Provider Last Rate Last Admin   denosumab (PROLIA) injection 60 mg  60 mg Subcutaneous Once Sherlene Shams, MD       [START ON 06/11/2023] denosumab (PROLIA) injection 60 mg  60 mg Subcutaneous Once Sherlene Shams, MD  Review of Systems;  Patient denies headache, fevers, malaise, unintentional weight loss, skin rash, eye pain, sinus congestion and sinus pain, sore throat, dysphagia,  hemoptysis , cough, dyspnea, wheezing, chest pain, palpitations, orthopnea, edema, abdominal pain, nausea, melena, diarrhea, constipation, flank pain, dysuria, hematuria, urinary  Frequency, nocturia, numbness, tingling, seizures,  Focal weakness, Loss of consciousness,  Tremor, insomnia, depression, anxiety, and suicidal ideation.      Objective:  BP 136/70   Pulse 88   Ht 5\' 4"  (1.626 m)   Wt 149 lb 9.6 oz (67.9 kg)   SpO2 96%   BMI 25.68 kg/m   BP Readings from Last 3 Encounters:  01/09/23 136/70  07/11/22 120/70  06/27/22 120/69    Wt Readings from Last 3 Encounters:  01/09/23 149 lb 9.6 oz (67.9 kg)  11/29/22 144 lb (65.3 kg)  07/11/22 144 lb 12.8 oz (65.7 kg)    Physical Exam  Lab  Results  Component Value Date   HGBA1C 5.5 06/11/2022   HGBA1C 5.6 02/10/2019   HGBA1C 5.4 05/05/2014    Lab Results  Component Value Date   CREATININE 0.67 06/11/2022   CREATININE 0.65 12/29/2021   CREATININE 0.74 06/09/2021    Lab Results  Component Value Date   WBC 6.6 06/11/2022   HGB 14.0 06/11/2022   HCT 42.1 06/11/2022   PLT 273.0 06/11/2022   GLUCOSE 98 06/11/2022   CHOL 193 06/11/2022   TRIG 83.0 06/11/2022   HDL 95.00 06/11/2022   LDLDIRECT 75.0 06/11/2022   LDLCALC 82 06/11/2022   ALT 17 06/11/2022   AST 18 06/11/2022   NA 140 06/11/2022   K 4.4 06/11/2022   CL 104 06/11/2022   CREATININE 0.67 06/11/2022   BUN 12 06/11/2022   CO2 25 06/11/2022   TSH 2.30 06/11/2022   INR 1.1 06/17/2020   HGBA1C 5.5 06/11/2022   MICROALBUR <0.7 06/01/2020    No results found.  Assessment & Plan:  .Primary hypertension -     Comprehensive metabolic panel -     Microalbumin / creatinine urine ratio  Hyperlipidemia LDL goal <70 -     Lipid panel -     LDL cholesterol, direct  Impaired fasting glucose -     Comprehensive metabolic panel -     Hemoglobin A1c  Vitamin D deficiency -     VITAMIN D 25 Hydroxy (Vit-D Deficiency, Fractures)  Encounter for screening mammogram for malignant neoplasm of breast     I provided 30 minutes of face-to-face time during this encounter reviewing patient's last visit with me, patient's  most recent visit with cardiology,  nephrology,  and neurology,  recent surgical and non surgical procedures, previous  labs and imaging studies, counseling on currently addressed issues,  and post visit ordering to diagnostics and therapeutics .   Follow-up: No follow-ups on file.   Sherlene Shams, MD

## 2023-01-10 LAB — LIPID PANEL
Cholesterol: 172 mg/dL (ref 0–200)
HDL: 90.7 mg/dL (ref >39.00)
LDL Cholesterol: 57 mg/dL (ref 0–99)
NonHDL: 80.98
Total CHOL/HDL Ratio: 2
Triglycerides: 118 mg/dL (ref 0.0–149.0)
VLDL: 23.6 mg/dL (ref 0.0–40.0)

## 2023-01-10 LAB — COMPREHENSIVE METABOLIC PANEL
ALT: 14 U/L (ref 0–35)
AST: 17 U/L (ref 0–37)
Albumin: 4.3 g/dL (ref 3.5–5.2)
Alkaline Phosphatase: 55 U/L (ref 39–117)
BUN: 22 mg/dL (ref 6–23)
CO2: 28 meq/L (ref 19–32)
Calcium: 9.5 mg/dL (ref 8.4–10.5)
Chloride: 102 meq/L (ref 96–112)
Creatinine, Ser: 1 mg/dL (ref 0.40–1.20)
GFR: 50.42 mL/min — ABNORMAL LOW (ref >60.00)
Glucose, Bld: 81 mg/dL (ref 70–99)
Potassium: 3.8 meq/L (ref 3.5–5.1)
Sodium: 139 meq/L (ref 135–145)
Total Bilirubin: 0.3 mg/dL (ref 0.2–1.2)
Total Protein: 6.9 g/dL (ref 6.0–8.3)

## 2023-01-10 LAB — MICROALBUMIN / CREATININE URINE RATIO
Creatinine,U: 221.4 mg/dL
Microalb Creat Ratio: 1.1 mg/g (ref 0.0–30.0)
Microalb, Ur: 2.4 mg/dL — ABNORMAL HIGH (ref 0.0–1.9)

## 2023-01-10 LAB — HEMOGLOBIN A1C: Hgb A1c MFr Bld: 5.7 % (ref 4.6–6.5)

## 2023-01-10 LAB — LDL CHOLESTEROL, DIRECT: Direct LDL: 60 mg/dL

## 2023-01-10 LAB — VITAMIN D 25 HYDROXY (VIT D DEFICIENCY, FRACTURES): VITD: 31.32 ng/mL (ref 30.00–100.00)

## 2023-01-10 NOTE — Assessment & Plan Note (Signed)
Resmains asymptomatic with continued participation in regular exercise, stretching and yoga.

## 2023-01-10 NOTE — Assessment & Plan Note (Signed)
Mnaged with Prolia  sicne 2017.l   She has  no history of fractures. Her  T scores have improved into the osteopenia range.  Continue Prolia every 6 months calcium and Vitamin D supplementation  Last vitamin D Lab Results  Component Value Date   VD25OH 31.32 01/09/2023

## 2023-01-10 NOTE — Assessment & Plan Note (Signed)
Recent episodes of panic attacks have resolved with use of lexapro.  She does not fear death,  Only disability from stroke or other disease that would render her unable to care for herself.

## 2023-01-10 NOTE — Assessment & Plan Note (Signed)
Patient is tolerating amlodipine and and taking as as prescribed  Home BP readings have been done about once per week and are  generally < 130/80 .  No changes today

## 2023-01-10 NOTE — Assessment & Plan Note (Signed)
WE reviewed the incidental  findings of abd/pelvic CT scan done in 2022 to evaluate LLQ pain.  .  Patient has been  taking osuvastatin  Lab Results  Component Value Date   CHOL 172 01/09/2023   HDL 90.70 01/09/2023   LDLCALC 57 01/09/2023   LDLDIRECT 60.0 01/09/2023   TRIG 118.0 01/09/2023   CHOLHDL 2 01/09/2023   Lab Results  Component Value Date   ALT 14 01/09/2023   AST 17 01/09/2023   ALKPHOS 55 01/09/2023   BILITOT 0.3 01/09/2023

## 2023-01-10 NOTE — Assessment & Plan Note (Signed)
She remains ambien dependent .  I have reviewed the dangers of prescribing this drug to the elderly with patient.  She has been ambien dependent for over five years, and prior attempts to use an alternative medication  have been unsuccessful resulting in loss of sleep.  She did not sleep  well during a trial of  reducing the dose to 5mg  And is willing to accept the risks of adverse events. Refilled today at the 10 mg dose  

## 2023-01-10 NOTE — Assessment & Plan Note (Signed)
Taking 2000 IUs daily   Last vitamin D Lab Results  Component Value Date   VD25OH 31.32 01/09/2023

## 2023-01-13 DIAGNOSIS — R944 Abnormal results of kidney function studies: Secondary | ICD-10-CM | POA: Insufficient documentation

## 2023-01-13 DIAGNOSIS — N184 Chronic kidney disease, stage 4 (severe): Secondary | ICD-10-CM | POA: Insufficient documentation

## 2023-01-13 NOTE — Assessment & Plan Note (Signed)
With new onset proteinuria.  Advised to return when well hydrated to establish baseline

## 2023-01-13 NOTE — Addendum Note (Signed)
Addended by: Sherlene Shams on: 01/13/2023 04:03 PM   Modules accepted: Orders

## 2023-01-14 ENCOUNTER — Other Ambulatory Visit (INDEPENDENT_AMBULATORY_CARE_PROVIDER_SITE_OTHER): Payer: Medicare PPO

## 2023-01-14 DIAGNOSIS — R944 Abnormal results of kidney function studies: Secondary | ICD-10-CM

## 2023-01-14 LAB — RENAL FUNCTION PANEL
Albumin: 4.4 g/dL (ref 3.5–5.2)
BUN: 14 mg/dL (ref 6–23)
CO2: 29 meq/L (ref 19–32)
Calcium: 8.9 mg/dL (ref 8.4–10.5)
Chloride: 101 meq/L (ref 96–112)
Creatinine, Ser: 0.69 mg/dL (ref 0.40–1.20)
GFR: 77.62 mL/min (ref >60.00)
Glucose, Bld: 95 mg/dL (ref 70–99)
Phosphorus: 3.5 mg/dL (ref 2.3–4.6)
Potassium: 4.3 meq/L (ref 3.5–5.1)
Sodium: 137 meq/L (ref 135–145)

## 2023-01-15 MED ORDER — TELMISARTAN 20 MG PO TABS: 20.0000 mg | ORAL_TABLET | Freq: Every day | ORAL | 0 refills | Status: DC

## 2023-01-15 NOTE — Addendum Note (Signed)
Addended by: Sherlene Shams on: 01/15/2023 09:13 AM   Modules accepted: Orders

## 2023-01-28 ENCOUNTER — Other Ambulatory Visit (INDEPENDENT_AMBULATORY_CARE_PROVIDER_SITE_OTHER): Payer: Medicare PPO

## 2023-01-28 DIAGNOSIS — I10 Essential (primary) hypertension: Secondary | ICD-10-CM

## 2023-01-29 LAB — BASIC METABOLIC PANEL
BUN: 19 mg/dL (ref 6–23)
CO2: 28 meq/L (ref 19–32)
Calcium: 9.7 mg/dL (ref 8.4–10.5)
Chloride: 101 meq/L (ref 96–112)
Creatinine, Ser: 0.95 mg/dL (ref 0.40–1.20)
GFR: 53.6 mL/min — ABNORMAL LOW (ref >60.00)
Glucose, Bld: 103 mg/dL — ABNORMAL HIGH (ref 70–99)
Potassium: 3.9 meq/L (ref 3.5–5.1)
Sodium: 137 meq/L (ref 135–145)

## 2023-02-13 ENCOUNTER — Other Ambulatory Visit: Payer: Self-pay | Admitting: Internal Medicine

## 2023-02-13 DIAGNOSIS — I10 Essential (primary) hypertension: Secondary | ICD-10-CM

## 2023-02-25 ENCOUNTER — Other Ambulatory Visit (INDEPENDENT_AMBULATORY_CARE_PROVIDER_SITE_OTHER): Payer: Medicare PPO

## 2023-02-25 DIAGNOSIS — H903 Sensorineural hearing loss, bilateral: Secondary | ICD-10-CM | POA: Diagnosis not present

## 2023-02-25 DIAGNOSIS — R944 Abnormal results of kidney function studies: Secondary | ICD-10-CM | POA: Diagnosis not present

## 2023-02-25 DIAGNOSIS — H6063 Unspecified chronic otitis externa, bilateral: Secondary | ICD-10-CM | POA: Diagnosis not present

## 2023-02-25 LAB — BASIC METABOLIC PANEL
BUN: 13 mg/dL (ref 6–23)
CO2: 29 meq/L (ref 19–32)
Calcium: 8.7 mg/dL (ref 8.4–10.5)
Chloride: 105 meq/L (ref 96–112)
Creatinine, Ser: 0.68 mg/dL (ref 0.40–1.20)
GFR: 77.83 mL/min (ref >60.00)
Glucose, Bld: 77 mg/dL (ref 70–99)
Potassium: 4.2 meq/L (ref 3.5–5.1)
Sodium: 139 meq/L (ref 135–145)

## 2023-02-27 ENCOUNTER — Encounter: Payer: Self-pay | Admitting: Internal Medicine

## 2023-03-05 ENCOUNTER — Ambulatory Visit: Payer: Medicare PPO | Admitting: Internal Medicine

## 2023-03-13 DIAGNOSIS — H903 Sensorineural hearing loss, bilateral: Secondary | ICD-10-CM | POA: Diagnosis not present

## 2023-04-08 ENCOUNTER — Encounter: Payer: Self-pay | Admitting: Internal Medicine

## 2023-04-09 ENCOUNTER — Encounter: Payer: Self-pay | Admitting: Internal Medicine

## 2023-04-19 DIAGNOSIS — Z961 Presence of intraocular lens: Secondary | ICD-10-CM | POA: Diagnosis not present

## 2023-04-19 DIAGNOSIS — H353131 Nonexudative age-related macular degeneration, bilateral, early dry stage: Secondary | ICD-10-CM | POA: Diagnosis not present

## 2023-04-19 DIAGNOSIS — H04123 Dry eye syndrome of bilateral lacrimal glands: Secondary | ICD-10-CM | POA: Diagnosis not present

## 2023-04-19 DIAGNOSIS — H35372 Puckering of macula, left eye: Secondary | ICD-10-CM | POA: Diagnosis not present

## 2023-04-19 DIAGNOSIS — Z0100 Encounter for examination of eyes and vision without abnormal findings: Secondary | ICD-10-CM | POA: Diagnosis not present

## 2023-05-01 DIAGNOSIS — H903 Sensorineural hearing loss, bilateral: Secondary | ICD-10-CM | POA: Diagnosis not present

## 2023-05-08 DIAGNOSIS — H903 Sensorineural hearing loss, bilateral: Secondary | ICD-10-CM | POA: Diagnosis not present

## 2023-05-13 ENCOUNTER — Other Ambulatory Visit: Payer: Self-pay

## 2023-05-13 MED ORDER — ROSUVASTATIN CALCIUM 10 MG PO TABS: ORAL_TABLET | ORAL | 1 refills | Status: DC

## 2023-05-14 ENCOUNTER — Emergency Department
Admission: EM | Admit: 2023-05-14 | Discharge: 2023-05-14 | Disposition: A | Discharge status: Home / Self Care | Attending: Emergency Medicine | Admitting: Emergency Medicine

## 2023-05-14 ENCOUNTER — Encounter: Payer: Self-pay | Admitting: Intensive Care

## 2023-05-14 ENCOUNTER — Ambulatory Visit
Admission: EM | Admit: 2023-05-14 | Discharge: 2023-05-14 | Disposition: A | Discharge status: Home / Self Care | Attending: Emergency Medicine | Admitting: Emergency Medicine

## 2023-05-14 ENCOUNTER — Other Ambulatory Visit: Payer: Self-pay

## 2023-05-14 ENCOUNTER — Emergency Department

## 2023-05-14 DIAGNOSIS — K5790 Diverticulosis of intestine, part unspecified, without perforation or abscess without bleeding: Secondary | ICD-10-CM | POA: Diagnosis not present

## 2023-05-14 DIAGNOSIS — Z90710 Acquired absence of both cervix and uterus: Secondary | ICD-10-CM | POA: Diagnosis not present

## 2023-05-14 DIAGNOSIS — K5730 Diverticulosis of large intestine without perforation or abscess without bleeding: Secondary | ICD-10-CM | POA: Diagnosis not present

## 2023-05-14 DIAGNOSIS — I700 Atherosclerosis of aorta: Secondary | ICD-10-CM | POA: Diagnosis not present

## 2023-05-14 DIAGNOSIS — R319 Hematuria, unspecified: Secondary | ICD-10-CM | POA: Diagnosis not present

## 2023-05-14 DIAGNOSIS — M545 Low back pain, unspecified: Secondary | ICD-10-CM | POA: Insufficient documentation

## 2023-05-14 DIAGNOSIS — I10 Essential (primary) hypertension: Secondary | ICD-10-CM | POA: Insufficient documentation

## 2023-05-14 DIAGNOSIS — R109 Unspecified abdominal pain: Secondary | ICD-10-CM | POA: Diagnosis not present

## 2023-05-14 LAB — POCT URINALYSIS DIP (MANUAL ENTRY)
Bilirubin, UA: NEGATIVE
Glucose, UA: NEGATIVE mg/dL
Ketones, POC UA: NEGATIVE mg/dL
Leukocytes, UA: NEGATIVE
Nitrite, UA: NEGATIVE
Protein Ur, POC: NEGATIVE mg/dL
Spec Grav, UA: <=1.005 — AB (ref 1.010–1.025)
Urobilinogen, UA: 0.2 U/dL
pH, UA: 5.5 (ref 5.0–8.0)

## 2023-05-14 LAB — COMPREHENSIVE METABOLIC PANEL WITH GFR
ALT: 16 U/L (ref 0–44)
AST: 15 U/L (ref 15–41)
Albumin: 4.2 g/dL (ref 3.5–5.0)
Alkaline Phosphatase: 54 U/L (ref 38–126)
Anion gap: 7 (ref 5–15)
BUN: 18 mg/dL (ref 8–23)
CO2: 26 mmol/L (ref 22–32)
Calcium: 9.7 mg/dL (ref 8.9–10.3)
Chloride: 105 mmol/L (ref 98–111)
Creatinine, Ser: 0.66 mg/dL (ref 0.44–1.00)
GFR, Estimated: >60 mL/min (ref >60)
Glucose, Bld: 121 mg/dL — ABNORMAL HIGH (ref 70–99)
Potassium: 3.9 mmol/L (ref 3.5–5.1)
Sodium: 138 mmol/L (ref 135–145)
Total Bilirubin: 0.5 mg/dL (ref 0.0–1.2)
Total Protein: 7 g/dL (ref 6.5–8.1)

## 2023-05-14 LAB — URINALYSIS, ROUTINE W REFLEX MICROSCOPIC
Bilirubin Urine: NEGATIVE
Glucose, UA: NEGATIVE mg/dL
Hgb urine dipstick: NEGATIVE
Ketones, ur: NEGATIVE mg/dL
Leukocytes,Ua: NEGATIVE
Nitrite: NEGATIVE
Protein, ur: NEGATIVE mg/dL
Specific Gravity, Urine: 1.013 (ref 1.005–1.030)
pH: 5 (ref 5.0–8.0)

## 2023-05-14 LAB — CBC WITH DIFFERENTIAL/PLATELET
Abs Immature Granulocytes: 0.03 10*3/uL (ref 0.00–0.07)
Basophils Absolute: 0 10*3/uL (ref 0.0–0.1)
Basophils Relative: 1 %
Eosinophils Absolute: 0.3 10*3/uL (ref 0.0–0.5)
Eosinophils Relative: 4 %
HCT: 40.8 % (ref 36.0–46.0)
Hemoglobin: 13.3 g/dL (ref 12.0–15.0)
Immature Granulocytes: 0 %
Lymphocytes Relative: 26 %
Lymphs Abs: 2.3 10*3/uL (ref 0.7–4.0)
MCH: 29.8 pg (ref 26.0–34.0)
MCHC: 32.6 g/dL (ref 30.0–36.0)
MCV: 91.3 fL (ref 80.0–100.0)
Monocytes Absolute: 0.7 10*3/uL (ref 0.1–1.0)
Monocytes Relative: 8 %
Neutro Abs: 5.5 10*3/uL (ref 1.7–7.7)
Neutrophils Relative %: 61 %
Platelets: 262 10*3/uL (ref 150–400)
RBC: 4.47 MIL/uL (ref 3.87–5.11)
RDW: 13.4 % (ref 11.5–15.5)
WBC: 8.8 10*3/uL (ref 4.0–10.5)
nRBC: 0 % (ref 0.0–0.2)

## 2023-05-14 MED ORDER — ACETAMINOPHEN 500 MG PO TABS
1000.0000 mg | ORAL_TABLET | Freq: Once | ORAL | Status: AC
  Administered 2023-05-14: 1000 mg via ORAL
  Filled 2023-05-14: qty 2

## 2023-05-14 MED ORDER — KETOROLAC TROMETHAMINE 30 MG/ML IJ SOLN
15.0000 mg | Freq: Once | INTRAMUSCULAR | Status: AC
  Administered 2023-05-14: 15 mg via INTRAMUSCULAR
  Filled 2023-05-14: qty 1

## 2023-05-14 MED ORDER — LIDOCAINE 5 % EX PTCH: 1.0000 | MEDICATED_PATCH | CUTANEOUS | 0 refills | Status: AC

## 2023-05-14 MED ORDER — LIDOCAINE 5 % EX PTCH
1.0000 | MEDICATED_PATCH | CUTANEOUS | Status: DC
  Administered 2023-05-14: 1 via TRANSDERMAL
  Filled 2023-05-14: qty 1

## 2023-05-14 NOTE — Discharge Instructions (Addendum)
 Your CT scan also showed possible gallbladder sludge or stones, since you not having any abdominal pain, jaundice, nausea vomiting, I think is okay for you to follow-up with your primary care doctor outpatient.  If you have any of the symptoms, please come back to the emergency department.

## 2023-05-14 NOTE — ED Provider Notes (Signed)
 Arlander Bellman    CSN: [id number] Arrival date & time: 05/14/23  1651      History   Chief Complaint Chief Complaint  Patient presents with   Back Pain    HPI Laura [name] is a 89 y.o. female.  Patient presents with left mid back pain since yesterday.  She lifted a heavy plant yesterday but states her back did not hurt at that time.  Her back pain has been getting worse.  She took Tylenol this morning.  She denies abdominal pain, dysuria, hematuria, fever, numbness, weakness, paresthesias.  The history is provided by the patient, a relative and medical records.    Past Medical History:  Diagnosis Date   Accidental fall 10/16/2013   Anxiety    Carotid stenosis    Left    COVID-19 virus infection 09/14/2020   Fracture of rib 09/26/2019   High cholesterol    Hypertension    Pt states that her blood pressure is very well controlled   Migraine with aura    Neuropathy    Osteoporosis     Patient Active Problem List   Diagnosis Date Noted   Decreased GFR 01/13/2023   Vitamin D deficiency 06/14/2022   Pain of right midfoot 06/11/2022   Right medial knee pain 06/11/2022   Pulsatile tinnitus 02/02/2022   Hemorrhoid 06/11/2021   History of rib fracture 01/07/2021   Cervical radiculopathy 01/07/2021   Aortic atherosclerosis (HCC) 05/02/2020   Dizziness 12/31/2018   Degenerative joint disease (DJD) of hip 11/29/2018   Bursitis of left hip 05/31/2018   Encounter for preventive health examination 07/31/2016   DDD (degenerative disc disease), lumbar 12/03/2014   Intracranial arachnoid cyst 10/25/2014   Low back pain 08/20/2014   Migraine aura without headache 04/24/2014   Encounter for Medicare annual wellness exam 11/01/2013   S/P hysterectomy with oophorectomy 01/03/2013   HTN (hypertension) 10/19/2012   B12 deficiency 04/16/2012   Generalized anxiety disorder 04/16/2012   Neuropathy of both feet 09/18/2011   Hyperlipidemia LDL goal <70 03/21/2011    Insomnia due to anxiety and fear 03/21/2011   Osteoporosis     Past Surgical History:  Procedure Laterality Date   ABDOMINAL HYSTERECTOMY     secondary to peritonitis and complications    APPENDECTOMY     CATARACT EXTRACTION W/PHACO Left 06/27/2022   Procedure: CATARACT EXTRACTION PHACO AND INTRAOCULAR LENS PLACEMENT (IOC) LEFT 6.36 00:43.1;  Surgeon: Annell Kidney, MD;  Location: [facility];  Service: Ophthalmology;  Laterality: Left;   CATARACT EXTRACTION W/PHACO Right 07/11/2022   Procedure: CATARACT EXTRACTION PHACO AND INTRAOCULAR LENS PLACEMENT (IOC) RIGHT 8.35 00:33.9;  Surgeon: Annell Kidney, MD;  Location: [facility];  Service: Ophthalmology;  Laterality: Right;   TONSILLECTOMY     TUBAL LIGATION     at age 89, vaginal complicated by peritonitis    OB History   No obstetric history on file.      Home Medications    Prior to Admission medications   Medication Sig Start Date End Date Taking? Authorizing Provider  calcium carbonate (OS-CAL) 600 MG TABS Take 600 mg by mouth 2 (two) times daily with a meal.    [provider]  Cholecalciferol (VITAMIN D3) 50 MCG (2000 UT) CAPS Take 1 capsule by mouth daily.    [provider]  Coenzyme Q10 (CO Q 10) 100 MG CAPS Take 1 capsule by mouth daily.    [provider]  Cyanocobalamin 2500 MCG SUBL PLACE 1 TABLET  UNDER THE TONGUE DAILY AS DIRECTED 06/07/16   Thersia Flax, MD  denosumab (PROLIA) 60 MG/ML SOLN injection Inject 60 mg into the skin every 6 (six) months. Administer in upper arm, thigh, or abdomen    [provider]  escitalopram (LEXAPRO) 10 MG tablet TAKE 1 TABLET(10 MG) BY MOUTH DAILY 01/09/23   Tullo, Teresa L, MD  hydrocortisone 2.5 % cream Apply 1 Application topically. 10/11/22   [provider]  magnesium oxide (MAG-OX) 400 MG tablet Take 400 mg by mouth 2 (two) times daily.    [provider]  Multiple Vitamins-Minerals  (PRESERVISION AREDS 2 PO) Take 1 capsule by mouth daily.    [provider]  nitroGLYCERIN (NITROSTAT) 0.4 MG SL tablet Place 1 tablet (0.4 mg total) under the tongue every 5 (five) minutes as needed for chest pain. 08/26/15   Thersia Flax, MD  omeprazole (PRILOSEC) 20 MG capsule Take 1 capsule (20 mg total) by mouth daily. 01/09/23   Thersia Flax, MD  psyllium (METAMUCIL) 58.6 % packet Take 1 packet by mouth daily.    [provider]  Riboflavin 100 MG CAPS Take 1 capsule by mouth 2 (two) times daily.     [provider]  rosuvastatin (CRESTOR) 10 MG tablet TAKE 1 TABLET(10 MG) BY MOUTH DAILY 05/13/23   Tullo, Teresa L, MD  telmisartan (MICARDIS) 20 MG tablet TAKE 1 TABLET(20 MG) BY MOUTH DAILY 02/15/23   Tullo, Teresa L, MD  zolpidem (AMBIEN) 10 MG tablet TAKE 1 TABLET BY MOUTH AT BEDTIME 01/09/23   Thersia Flax, MD    Family History Family History  Adopted: Yes  Problem Relation Age of Onset   Breast cancer Mother 43   Breast cancer Sister 30       2 times   Cancer Daughter        Breast    Breast cancer Daughter 71    Social History Social History   Tobacco Use   Smoking status: Former    Current packs/day: 0.00    Average packs/day: 1 pack/day for 9.0 years (9.0 ttl pk-yrs)    Types: Cigarettes    Start date: 87    Quit date: 1965    Years since quitting: 60.3   Smokeless tobacco: Never  Vaping Use   Vaping status: Never Used  Substance Use Topics   Alcohol use: Yes    Alcohol/week: 14.0 standard drinks of alcohol    Types: 14 Glasses of wine per week    Comment: 2 glasses of wine per day.   Drug use: No     Allergies   Erythromycin   Review of Systems Review of Systems  Constitutional:  Negative for chills and fever.  Gastrointestinal:  Negative for abdominal pain.  Genitourinary:  Negative for dysuria and hematuria.  Musculoskeletal:  Positive for back pain. Negative for gait problem.  Skin:  Negative for color change,  rash and wound.  Neurological:  Negative for weakness and numbness.     Physical Exam Triage Vital Signs ED Triage Vitals  Encounter Vitals Group     BP      Systolic BP Percentile      Diastolic BP Percentile      Pulse      Resp      Temp      Temp src      SpO2      Weight      Height      Head Circumference  Peak Flow      Pain Score      Pain Loc      Pain Education      Exclude from Growth Chart    No data found.  Updated Vital Signs BP 133/71   Pulse 97   Temp 97.7 F (36.5 C)   Resp 18   SpO2 97%   Visual Acuity Right Eye Distance:   Left Eye Distance:   Bilateral Distance:    Right Eye Near:   Left Eye Near:    Bilateral Near:     Physical Exam Constitutional:      General: She is not in acute distress. HENT:     Mouth/Throat:     Mouth: Mucous membranes are moist.  Cardiovascular:     Rate and Rhythm: Normal rate and regular rhythm.     Heart sounds: Normal heart sounds.  Pulmonary:     Effort: Pulmonary effort is normal. No respiratory distress.     Breath sounds: Normal breath sounds.  Abdominal:     General: Bowel sounds are normal.     Palpations: Abdomen is soft.     Tenderness: There is no abdominal tenderness. There is left CVA tenderness. There is no right CVA tenderness, guarding or rebound.  Skin:    General: Skin is warm and dry.     Findings: No bruising, erythema, lesion or rash.  Neurological:     General: No focal deficit present.     Mental Status: She is alert.     Sensory: No sensory deficit.     Motor: No weakness.     Gait: Gait normal.      UC Treatments / Results  Labs (all labs ordered are listed, but only abnormal results are displayed) Labs Reviewed  POCT URINALYSIS DIP (MANUAL ENTRY) - Abnormal; Notable for the following components:      Result Value   Spec Grav, UA <=1.005 (*)    Blood, UA trace-intact (*)    All other components within normal limits    EKG   Radiology No results  found.  Procedures Procedures (including critical care time)  Medications Ordered in UC Medications - No data to display  Initial Impression / Assessment and Plan / UC Course  I have reviewed the triage vital signs and the nursing notes.  Pertinent labs & imaging results that were available during my care of the patient were reviewed by me and considered in my medical decision making (see chart for details).   Left flank pain.  Afebrile and vital signs are stable.  Patient is acutely uncomfortable.  Left CVA tenderness.  Urine has trace blood but no indication of infection.  Patient reports history of abnormal kidney function a few months ago which improved at her last PCP visit in January.  She is concerned for possible kidney issue.  Sending her to the ED for evaluation.  She is agreeable to this.  She is accompanied by her daughter who will take her to [facility] ED.  Final Clinical Impressions(s) / UC Diagnoses   Final diagnoses:  Acute left flank pain     Discharge Instructions      Go to the emergency department for evaluation of your left flank pain.      ED Prescriptions   None    PDMP not reviewed this encounter.   Mickie Bail, NP 05/14/23 1736

## 2023-05-14 NOTE — ED Triage Notes (Signed)
 Patient c/o left sided back pain X2 days. Sent by UC and was told she had a little amount of blood in urine.  Denies trouble urinating   Denies injury

## 2023-05-14 NOTE — Discharge Instructions (Signed)
 Go to the emergency department for evaluation of your left flank pain.

## 2023-05-14 NOTE — ED Triage Notes (Addendum)
 Patient to Urgent Care with complaints of left sided flank pain that started yesterday. Pain worse when reaching forward/ standing up. Denies hx of kidney stones.   Reports she lifted a heavy plant into a plant pot yesterday but denies any known injury.

## 2023-05-14 NOTE — ED Provider Notes (Signed)
 [facility] Provider Note    Event Date/Time   First MD Initiated Contact with Patient 05/14/23 1855     (approximate)   History   Back Pain   HPI  Laura [name] is a 89 y.o. female with history of hyperlipidemia, hypertension, neuropathy, presenting with left lower back pain for the last day.  Went to urgent care today, was noted to have some blood in her urine, sent in for further workup for possible kidney issues.  She denies any dysuria or hematuria, no prior history of kidney stones, no nausea, vomiting, diarrhea, abdominal pain, chest pain, shortness of breath, fever.  Denies any recent trauma or falls although daughter says that patient may have strained her back from picking up a very heavy pot yesterday.  Patient denies any incontinence, no weakness or numbness.  Is ambulating appropriately.  No saddle anesthesia.  Independent history obtained from daughter as above.  On independent review, she went to urgent care today, was noted to have left CVA tenderness, was noted to have trace of blood in her urine dip.     Physical Exam   Triage Vital Signs: ED Triage Vitals [05/14/23 1812]  Encounter Vitals Group     BP 139/61     Systolic BP Percentile      Diastolic BP Percentile      Pulse Rate 88     Resp 16     Temp 98.1 F (36.7 C)     Temp Source Oral     SpO2 95 %     Weight 145 lb (65.8 kg)     Height 5\' 3"  (1.6 m)     Head Circumference      Peak Flow      Pain Score 9     Pain Loc      Pain Education      Exclude from Growth Chart     Most recent vital signs: Vitals:   05/14/23 1812 05/14/23 1939  BP: 139/61 136/77  Pulse: 88 80  Resp: 16 18  Temp: 98.1 F (36.7 C)   SpO2: 95% 96%     General: Awake, no distress.  CV:  Good peripheral perfusion.  Resp:  Normal effort.  Abd:  No distention.  Soft nontender Other:  Mild left CVA tenderness, tender to palpation at her left lower paralumbar region.  No midline spinal  tenderness, no saddle anesthesia, no focal weakness or numbness.   ED Results / Procedures / Treatments   Labs (all labs ordered are listed, but only abnormal results are displayed) Labs Reviewed  COMPREHENSIVE METABOLIC PANEL WITH GFR - Abnormal; Notable for the following components:      Result Value   Glucose, Bld 121 (*)    All other components within normal limits  URINALYSIS, ROUTINE W REFLEX MICROSCOPIC - Abnormal; Notable for the following components:   Color, Urine YELLOW (*)    APPearance CLEAR (*)    All other components within normal limits  URINE CULTURE  CBC WITH DIFFERENTIAL/PLATELET     RADIOLOGY CT scan on my independent interpretation without obvious hydronephrosis   PROCEDURES:  Critical Care performed: No  Procedures   MEDICATIONS ORDERED IN ED: Medications  lidocaine (LIDODERM) 5 % 1 patch (1 patch Transdermal Patch Applied 05/14/23 1933)  acetaminophen (TYLENOL) tablet 1,000 mg (1,000 mg Oral Given 05/14/23 1933)  ketorolac (TORADOL) 30 MG/ML injection 15 mg (15 mg Intramuscular Given 05/14/23 1933)     IMPRESSION /  MDM / ASSESSMENT AND PLAN / ED COURSE  I reviewed the triage vital signs and the nursing notes.                              Differential diagnosis includes, but is not limited to, UTI, pyelonephritis, nephrolithiasis, musculoskeletal strain, sprain, contusion.  No midline spinal tenderness or reported trauma or falls to suggest fracture, dislocation, no saddle anesthesia, focal weakness or numbness or incontinence suggest cauda equina.  Will get labs, UA, CT renal stone.  Will give her Tylenol, Toradol, Lidoderm patch.  Reassess.  Shared decision making done with patient and daughter and they are agreeable to plan.  Patient's presentation is most consistent with acute presentation with potential threat to life or bodily function.  Independent review of labs and imaging below.  On reassessment patient is feeling better, ambulatory.   Shared decision making done with patient and daughter, discussed imaging and lab results including incidental findings with patient, they are agreeable plan for discharge and outpatient management.  Will give her prescription for Lidoderm patches, instructed to take Tylenol as needed for the pain.  Discussed soft tissue attenuation gallbladder that could be secondary to sludge versus biliary stones, given that she has no right upper quadrant pain, jaundice, nausea or vomiting at this time, I leave it is okay for her to follow-up outpatient.  Family and patient are agreeable with this plan.  Otherwise considered but no indication for inpatient admission at this time, she is safe for outpatient management.  Will discharge with strict return precautions.  Clinical Course as of 05/14/23 2146  Tue May 14, 2023  1610 Independent review of labs, UA is not consistent with UTI, no hematuria noted, no leukocytosis, electrolytes are severely deranged, creatinine is normal, LFTs are normal. [TT]  2119 CT Renal Stone Study IMPRESSION: 1. No obstructing renal stones or hydronephrosis. 2. Soft tissue attenuation process in the gallbladder. Ultrasound correlation recommended. 3. Diverticulosis. 4. Aortic atherosclerosis.   [TT]    Clinical Course User Index [TT] Drenda Gentle Richard Champion, MD     FINAL CLINICAL IMPRESSION(S) / ED DIAGNOSES   Final diagnoses:  Acute left-sided low back pain, unspecified whether sciatica present  Diverticulosis     Rx / DC Orders   ED Discharge Orders          Ordered    lidocaine (LIDODERM) 5 %  Every 24 hours        05/14/23 2144             Note:  This document was prepared using Dragon voice recognition software and may include unintentional dictation errors.    Shane Darling, MD 05/14/23 [phone]

## 2023-05-14 NOTE — ED Notes (Signed)
 Patient transported to CT

## 2023-05-15 LAB — URINE CULTURE: Culture: <10000 — AB

## 2023-05-20 DIAGNOSIS — H903 Sensorineural hearing loss, bilateral: Secondary | ICD-10-CM | POA: Diagnosis not present

## 2023-05-23 ENCOUNTER — Other Ambulatory Visit (HOSPITAL_COMMUNITY): Payer: Self-pay

## 2023-05-23 ENCOUNTER — Telehealth: Payer: Self-pay

## 2023-05-23 ENCOUNTER — Encounter: Payer: Self-pay | Admitting: Internal Medicine

## 2023-05-23 ENCOUNTER — Ambulatory Visit: Admitting: Internal Medicine

## 2023-05-23 VITALS — BP 130/68 | HR 67 | Ht 63.0 in | Wt 152.0 lb

## 2023-05-23 DIAGNOSIS — E785 Hyperlipidemia, unspecified: Secondary | ICD-10-CM | POA: Diagnosis not present

## 2023-05-23 DIAGNOSIS — K828 Other specified diseases of gallbladder: Secondary | ICD-10-CM

## 2023-05-23 MED ORDER — ESCITALOPRAM OXALATE 10 MG PO TABS: ORAL_TABLET | ORAL | 1 refills | Status: DC

## 2023-05-23 NOTE — Assessment & Plan Note (Signed)
 Suspending statin for 3 months due to reports of heavy feeling in legs

## 2023-05-23 NOTE — Progress Notes (Signed)
 Subjective:  Patient ID: Laura [name], female    DOB: 1932-06-20  Age: 89 y.o. MRN: [id number]  CC: The primary encounter diagnosis was Gallbladder mass. A diagnosis of Hyperlipidemia LDL goal <70 was also pertinent to this visit.   HPI Laura [name] presents for  Chief Complaint  Patient presents with   Hospitalization Follow-up    ED follow up. Discuss the need for US    ER follow up:  to sent over by urgent care to ER  April 15 for left CVA pain and trace  hematuria by POC UA.   In ER urinalysis was normal and  CT stone protocol negative for stones.  However GB appeared abnormal: sludge, stones or mass suspected and U/s recommended   2) legs feel heavy when walking , denies pain .  Started after starting telmisartan . Has been exercising less for the last several months    Outpatient Medications Prior to Visit  Medication Sig Dispense Refill   calcium  carbonate (OS-CAL) 600 MG TABS Take 600 mg by mouth 2 (two) times daily with a meal.     Cholecalciferol (VITAMIN D3) 50 MCG (2000 UT) CAPS Take 1 capsule by mouth daily.     Coenzyme Q10 (CO Q 10) 100 MG CAPS Take 1 capsule by mouth daily.     Cyanocobalamin  2500 MCG SUBL PLACE 1 TABLET UNDER THE TONGUE DAILY AS DIRECTED 90 tablet 1   denosumab  (PROLIA ) 60 MG/ML SOLN injection Inject 60 mg into the skin every 6 (six) months. Administer in upper arm, thigh, or abdomen     hydrocortisone 2.5 % cream Apply 1 Application topically.     lidocaine  (LIDODERM ) 5 % Place 1 patch onto the skin daily for 10 days. Remove & Discard patch within 12 hours or as directed by MD 10 patch 0   magnesium oxide (MAG-OX) 400 MG tablet Take 400 mg by mouth 2 (two) times daily.     Multiple Vitamins-Minerals (PRESERVISION AREDS 2 PO) Take 1 capsule by mouth daily.     nitroGLYCERIN  (NITROSTAT ) 0.4 MG SL tablet Place 1 tablet (0.4 mg total) under the tongue every 5 (five) minutes as needed for chest pain. 50 tablet 3   psyllium (METAMUCIL) 58.6 % packet  Take 1 packet by mouth daily.     Riboflavin 100 MG CAPS Take 1 capsule by mouth 2 (two) times daily.      rosuvastatin  (CRESTOR ) 10 MG tablet TAKE 1 TABLET(10 MG) BY MOUTH DAILY 90 tablet 1   telmisartan  (MICARDIS ) 20 MG tablet TAKE 1 TABLET(20 MG) BY MOUTH DAILY 90 tablet 1   zolpidem  (AMBIEN ) 10 MG tablet TAKE 1 TABLET BY MOUTH AT BEDTIME 30 tablet 5   escitalopram  (LEXAPRO ) 10 MG tablet TAKE 1 TABLET(10 MG) BY MOUTH DAILY 90 tablet 1   omeprazole  (PRILOSEC) 20 MG capsule Take 1 capsule (20 mg total) by mouth daily. (Patient not taking: Reported on 05/23/2023) 30 capsule 3   Facility-Administered Medications Prior to Visit  Medication Dose Route Frequency Provider Last Rate Last Admin   denosumab  (PROLIA ) injection 60 mg  60 mg Subcutaneous Once Zoey Gilkeson L, MD       [START ON 06/11/2023] denosumab  (PROLIA ) injection 60 mg  60 mg Subcutaneous Once Izaiah Tabb L, MD        Review of Systems;  Patient denies headache, fevers, malaise, unintentional weight loss, skin rash, eye pain, sinus congestion and sinus pain, sore throat, dysphagia,  hemoptysis , cough, dyspnea, wheezing, chest pain, palpitations,  orthopnea, edema, abdominal pain, nausea, melena, diarrhea, constipation, flank pain, dysuria, hematuria, urinary  Frequency, nocturia, numbness, tingling, seizures,  Focal weakness, Loss of consciousness,  Tremor, insomnia, depression, anxiety, and suicidal ideation.      Objective:  BP 130/68   Pulse 67   Ht 5\' 3"  (1.6 m)   Wt 152 lb (68.9 kg)   SpO2 97%   BMI 26.93 kg/m   BP Readings from Last 3 Encounters:  05/23/23 130/68  05/14/23 131/84  05/14/23 133/71    Wt Readings from Last 3 Encounters:  05/23/23 152 lb (68.9 kg)  05/14/23 145 lb (65.8 kg)  01/09/23 149 lb 9.6 oz (67.9 kg)    Physical Exam Vitals reviewed.  Constitutional:      General: She is not in acute distress.    Appearance: Normal appearance. She is normal weight. She is not ill-appearing,  toxic-appearing or diaphoretic.  HENT:     Head: Normocephalic.  Eyes:     General: No scleral icterus.       Right eye: No discharge.        Left eye: No discharge.     Conjunctiva/sclera: Conjunctivae normal.  Cardiovascular:     Rate and Rhythm: Normal rate and regular rhythm.     Pulses: Normal pulses.     Heart sounds: Normal heart sounds.  Pulmonary:     Effort: Pulmonary effort is normal. No respiratory distress.     Breath sounds: Normal breath sounds.  Musculoskeletal:        General: Normal range of motion.  Skin:    General: Skin is warm and dry.  Neurological:     General: No focal deficit present.     Mental Status: She is alert and oriented to person, place, and time. Mental status is at baseline.  Psychiatric:        Mood and Affect: Mood normal.        Behavior: Behavior normal.        Thought Content: Thought content normal.        Judgment: Judgment normal.     Lab Results  Component Value Date   HGBA1C 5.7 01/09/2023   HGBA1C 5.5 06/11/2022   HGBA1C 5.6 02/10/2019    Lab Results  Component Value Date   CREATININE 0.66 05/14/2023   CREATININE 0.68 02/25/2023   CREATININE 0.95 01/28/2023    Lab Results  Component Value Date   WBC 8.8 05/14/2023   HGB 13.3 05/14/2023   HCT 40.8 05/14/2023   PLT 262 05/14/2023   GLUCOSE 121 (H) 05/14/2023   CHOL 172 01/09/2023   TRIG 118.0 01/09/2023   HDL 90.70 01/09/2023   LDLDIRECT 60.0 01/09/2023   LDLCALC 57 01/09/2023   ALT 16 05/14/2023   AST 15 05/14/2023   NA 138 05/14/2023   K 3.9 05/14/2023   CL 105 05/14/2023   CREATININE 0.66 05/14/2023   BUN 18 05/14/2023   CO2 26 05/14/2023   TSH 2.30 06/11/2022   INR 1.1 06/17/2020   HGBA1C 5.7 01/09/2023   MICROALBUR 2.4 (H) 01/09/2023    CT Renal Stone Study Result Date: 05/14/2023 CLINICAL DATA:  Flank pain. EXAM: CT ABDOMEN AND PELVIS WITHOUT CONTRAST TECHNIQUE: Multidetector CT imaging of the abdomen and pelvis was performed following the  standard protocol without IV contrast. RADIATION DOSE REDUCTION: This exam was performed according to the departmental dose-optimization program which includes automated exposure control, adjustment of the mA and/or kV according to patient size and/or use of iterative reconstruction  technique. COMPARISON:  03/18/2020. FINDINGS: Lower chest: Dependent bibasilar subsegmental atelectasis. No pleural or pericardial effusions. Hepatobiliary: No hepatic parenchymal abnormalities. No biliary ductal dilatation. There is soft tissue attenuation process in the gallbladder. This could represent stones, sludge or mass. Ultrasound correlation recommended. Pancreas: Unremarkable. No pancreatic ductal dilatation or surrounding inflammatory changes. Spleen: Normal in size without focal abnormality. Adrenals/Urinary Tract: Adrenal glands are unremarkable. Kidneys are normal, without renal calculi, focal lesion, or hydronephrosis. Bladder is unremarkable. Stomach/Bowel: Stomach is within normal limits. Appendix not seen and no evidence of appendicitis. No evidence of bowel wall thickening, distention, or inflammatory changes. Diverticulosis of the descending and sigmoid colon. Vascular/Lymphatic: Aortic atherosclerosis. No enlarged abdominal or pelvic lymph nodes. Reproductive: Status post hysterectomy. No adnexal masses. Other: No abdominal wall hernia or abnormality. No abdominopelvic ascites. Musculoskeletal: No acute or significant osseous findings. IMPRESSION: 1. No obstructing renal stones or hydronephrosis. 2. Soft tissue attenuation process in the gallbladder. Ultrasound correlation recommended. 3. Diverticulosis. 4. Aortic atherosclerosis. Electronically Signed   By: Sydell Eva M.D.   On: 05/14/2023 21:00    Assessment & Plan:  .Gallbladder mass Assessment & Plan: Incidental finding on  non contrasted CT renal.  She is asymptomatic .  Abd ultrasound ordered   Orders: -     US  ABDOMEN LIMITED RUQ (LIVER/GB);  Future  Hyperlipidemia LDL goal <70 Assessment & Plan: Suspending statin for 3 months due to reports of heavy feeling in legs    Other orders -     Escitalopram  Oxalate; TAKE 1 TABLET(10 MG) BY MOUTH DAILY  Dispense: 90 tablet; Refill: 1     I spent 34 minutes on the day of this face to face encounter reviewing patient's   recent ER  visit , relevant surgical and non surgical procedures, recent  labs and imaging studies,  reviewing the assessment and plan with patient, and post visit ordering and reviewing of  diagnostics and therapeutics with patient  .   Follow-up: No follow-ups on file.   Thersia Flax, MD

## 2023-05-23 NOTE — Telephone Encounter (Signed)
 Laura [name]

## 2023-05-23 NOTE — Assessment & Plan Note (Addendum)
 Incidental finding on  non contrasted CT renal.  She is asymptomatic .  Abd ultrasound ordered

## 2023-05-23 NOTE — Telephone Encounter (Signed)
 Pt ready for scheduling for PROLIA  on or after : 06/11/23  Option# 1: Buy/Bill (Office supplied medication)  Out-of-pocket cost due at time of clinic visit: $40  Number of injection/visits approved: 2  Primary: [employer] Prolia  co-insurance: $40 Admin fee co-insurance: 0%  Secondary: --- Prolia  co-insurance:  Admin fee co-insurance:   Medical Benefit Details: Date Benefits were checked: 05/16/23 Deductible: NO/ Coinsurance: $40/ Admin Fee: 0%  Prior Auth: APPROVED PA# [id number] Expiration Date: 01/30/23-01/29/24  # of doses approved: 2 ----------------------------------------------------------------------- Option# 2- Med Obtained from pharmacy:  Pharmacy benefit: Copay $64 (Paid to pharmacy) Admin Fee: 0% (Pay at clinic)  Prior Auth: N/A PA# Expiration Date:   # of doses approved:   If patient wants fill through the pharmacy benefit please send prescription to: [employer], and include estimated need by date in rx notes. Pharmacy will ship medication directly to the office.  Patient NOT eligible for Prolia  Copay Card. Copay Card can make patient's cost as little as $25. Link to apply: [email or web address]  ** This summary of benefits is an estimation of the patient's out-of-pocket cost. Exact cost may very based on individual plan coverage.

## 2023-05-23 NOTE — Telephone Encounter (Signed)
 Copied from CRM [id number]. Topic: General - Other >> May 23, 2023  3:20 PM Deaijah H wrote: Reason for CRM: [employer] called in to advise office to disregard fax on insurance verification that was faxed on 05/16/2023. New Case number [id number]

## 2023-05-23 NOTE — Patient Instructions (Addendum)
 Suspend the rosuvastatin  for 3 months to see if the heavy feeling in your legs  improves.   if  not , resume the medication.Aaron Aas  if the feeling resolves, restart the rosuvastatin  to see if it was a coincidence .   Abdominal  ultrasound of gallbladder has been ordered to rule out mass.  If you aren't called by next Thursday,  let me know

## 2023-05-27 ENCOUNTER — Inpatient Hospital Stay: Admitting: Internal Medicine

## 2023-05-29 ENCOUNTER — Ambulatory Visit
Admission: RE | Admit: 2023-05-29 | Discharge: 2023-05-29 | Disposition: A | Discharge status: Home / Self Care | Source: Ambulatory Visit | Attending: Internal Medicine | Admitting: Internal Medicine

## 2023-05-29 DIAGNOSIS — K828 Other specified diseases of gallbladder: Secondary | ICD-10-CM | POA: Diagnosis not present

## 2023-05-29 DIAGNOSIS — K8020 Calculus of gallbladder without cholecystitis without obstruction: Secondary | ICD-10-CM | POA: Diagnosis not present

## 2023-05-29 DIAGNOSIS — R1900 Intra-abdominal and pelvic swelling, mass and lump, unspecified site: Secondary | ICD-10-CM | POA: Diagnosis not present

## 2023-05-30 ENCOUNTER — Encounter: Payer: Self-pay | Admitting: Internal Medicine

## 2023-06-03 ENCOUNTER — Other Ambulatory Visit: Payer: Self-pay | Admitting: *Deleted

## 2023-06-03 DIAGNOSIS — E559 Vitamin D deficiency, unspecified: Secondary | ICD-10-CM

## 2023-06-03 DIAGNOSIS — M810 Age-related osteoporosis without current pathological fracture: Secondary | ICD-10-CM

## 2023-06-11 ENCOUNTER — Ambulatory Visit

## 2023-06-11 DIAGNOSIS — M810 Age-related osteoporosis without current pathological fracture: Secondary | ICD-10-CM

## 2023-06-11 MED ORDER — DENOSUMAB 60 MG/ML ~~LOC~~ SOSY
60.0000 mg | PREFILLED_SYRINGE | Freq: Once | SUBCUTANEOUS | Status: AC
  Administered 2023-12-31: 60 mg via SUBCUTANEOUS

## 2023-06-11 NOTE — Progress Notes (Signed)
 Patient presented for Prolia  injection to her left arm  patient voiced no concerns nor showed any signs of distress during injection

## 2023-07-02 ENCOUNTER — Encounter: Payer: Self-pay | Admitting: Internal Medicine

## 2023-07-02 DIAGNOSIS — R2689 Other abnormalities of gait and mobility: Secondary | ICD-10-CM

## 2023-07-03 MED ORDER — ZOLPIDEM TARTRATE 10 MG PO TABS: ORAL_TABLET | ORAL | 5 refills | Status: DC

## 2023-07-03 MED ORDER — ROSUVASTATIN CALCIUM 10 MG PO TABS: ORAL_TABLET | ORAL | 1 refills | Status: AC

## 2023-07-10 ENCOUNTER — Ambulatory Visit: Attending: Internal Medicine | Admitting: Physical Therapy

## 2023-07-10 ENCOUNTER — Other Ambulatory Visit: Payer: Self-pay

## 2023-07-10 DIAGNOSIS — R262 Difficulty in walking, not elsewhere classified: Secondary | ICD-10-CM | POA: Insufficient documentation

## 2023-07-10 DIAGNOSIS — R2689 Other abnormalities of gait and mobility: Secondary | ICD-10-CM | POA: Diagnosis not present

## 2023-07-10 DIAGNOSIS — M1611 Unilateral primary osteoarthritis, right hip: Secondary | ICD-10-CM | POA: Diagnosis not present

## 2023-07-10 DIAGNOSIS — M25551 Pain in right hip: Secondary | ICD-10-CM | POA: Diagnosis not present

## 2023-07-10 DIAGNOSIS — R269 Unspecified abnormalities of gait and mobility: Secondary | ICD-10-CM | POA: Insufficient documentation

## 2023-07-10 NOTE — Therapy (Signed)
 OUTPATIENT PHYSICAL THERAPY NEURO EVALUATION   Patient Name: Laura [name] MRN: [id number] DOB:Jun 03, 1932, 89 y.o., female Today's Date: 07/10/2023   PCP:    Thersia Flax, MD   REFERRING PROVIDER:    Thersia Flax, MD    END OF SESSION:  PT End of Session - 07/10/23 1354     Visit Number 1    Number of Visits 24    Date for PT Re-Evaluation 10/02/23    PT Start Time 1359    PT Stop Time 1439    PT Time Calculation (min) 40 min    Equipment Utilized During Treatment Gait belt    Activity Tolerance Patient tolerated treatment well    Behavior During Therapy [facility] for tasks assessed/performed             Past Medical History:  Diagnosis Date   Accidental fall 10/16/2013   Anxiety    Carotid stenosis    Left    COVID-19 virus infection 09/14/2020   Fracture of rib 09/26/2019   High cholesterol    Hypertension    Pt states that her blood pressure is very well controlled   Migraine with aura    Neuropathy    Osteoporosis    Past Surgical History:  Procedure Laterality Date   ABDOMINAL HYSTERECTOMY     secondary to peritonitis and complications    APPENDECTOMY     CATARACT EXTRACTION W/PHACO Left 06/27/2022   Procedure: CATARACT EXTRACTION PHACO AND INTRAOCULAR LENS PLACEMENT (IOC) LEFT 6.36 00:43.1;  Surgeon: Annell Kidney, MD;  Location: [facility];  Service: Ophthalmology;  Laterality: Left;   CATARACT EXTRACTION W/PHACO Right 07/11/2022   Procedure: CATARACT EXTRACTION PHACO AND INTRAOCULAR LENS PLACEMENT (IOC) RIGHT 8.35 00:33.9;  Surgeon: Annell Kidney, MD;  Location: [facility];  Service: Ophthalmology;  Laterality: Right;   TONSILLECTOMY     TUBAL LIGATION     at age 74, vaginal complicated by peritonitis   Patient Active Problem List   Diagnosis Date Noted   Gallbladder mass 05/23/2023   Decreased GFR 01/13/2023   Vitamin D  deficiency 06/14/2022   Pain of right midfoot 06/11/2022   Right medial knee pain  06/11/2022   Pulsatile tinnitus 02/02/2022   Hemorrhoid 06/11/2021   History of rib fracture 01/07/2021   Cervical radiculopathy 01/07/2021   Aortic atherosclerosis (HCC) 05/02/2020   Dizziness 12/31/2018   Degenerative joint disease (DJD) of hip 11/29/2018   Bursitis of left hip 05/31/2018   Encounter for preventive health examination 07/31/2016   DDD (degenerative disc disease), lumbar 12/03/2014   Intracranial arachnoid cyst 10/25/2014   Low back pain 08/20/2014   Migraine aura without headache 04/24/2014   Encounter for Medicare annual wellness exam 11/01/2013   S/P hysterectomy with oophorectomy 01/03/2013   HTN (hypertension) 10/19/2012   B12 deficiency 04/16/2012   Generalized anxiety disorder 04/16/2012   Neuropathy of both feet 09/18/2011   Hyperlipidemia LDL goal <70 03/21/2011   Insomnia due to anxiety and fear 03/21/2011   Osteoporosis     ONSET DATE: `6 months.   REFERRING DIAG:  Diagnosis  R26.89 (ICD-10-CM) - Balance problem    THERAPY DIAG:  Abnormality of gait and mobility  Difficulty in walking, not elsewhere classified  Balance disorder  Rationale for Evaluation and Treatment: Rehabilitation  SUBJECTIVE:  SUBJECTIVE STATEMENT:  Pt states that she has had BPPV in the past. Does not have dizzness with spinning sensation, but has difficulty with maintaining straight path when walking slow. Finds herself veering to the R and L.  Does Yoga regularly, but feels a little off balance compared to 4-5 years ago.  Does have hx of Migraines  with aura and mild aphasia following that passes after a few minute. >4 years since migraine with aphasia.   Pt accompanied by: self  PERTINENT HISTORY:  Pt with hx of BPPV, Migraines, Back, pain, knee pain, DDD, and neuropathy  presents to PT with increased balance deficits and difficulty with balance for long distance ambulation.  PAIN:  Are you having pain? No a little ache in the R knee. Does not rate.   PRECAUTIONS: Fall  RED FLAGS: None   WEIGHT BEARING RESTRICTIONS: No  FALLS: Has patient fallen in last 6 months? No  LIVING ENVIRONMENT: Lives with: lives alone daughter lives in town Lives in: House/apartment Stairs: Yes: External: 3-5 steps; on right going up, on left going up, and bilateral but cannot reach both Has following equipment at home: walking stick of long distances.   PLOF: Independent, Independent with basic ADLs, and Independent with household mobility without device  PATIENT GOALS: Be able to feel comfortable and confident with walking.  OBJECTIVE:  Note: Objective measures were completed at Evaluation unless otherwise noted.  DIAGNOSTIC FINDINGS: none recent.   COGNITION: Overall cognitive status: Within functional limits for tasks assessed   SENSATION: Light touch: Impaired  hx of neuropathy in BLE. In the soles of feel and slightly on the top of the foot.   COORDINATION: Grossly WFL with formal testing.    POSTURE: rounded shoulders  LOWER EXTREMITY ROM:     Grossly WFL  LOWER EXTREMITY MMT:    MMT Right Eval Left Eval  Hip flexion 4 4  Hip extension    Hip abduction 4+ 4+  Hip adduction 5 5  Hip internal rotation    Hip external rotation    Knee flexion 4+ 4+  Knee extension 5 5  Ankle dorsiflexion 4+ 4+  Ankle plantarflexion    Ankle inversion    Ankle eversion    (Blank rows = not tested)  BED MOBILITY:  Not tested  TRANSFERS: Sit to stand: Complete Independence  Assistive device utilized: None     Stand to sit: Complete Independence  Assistive device utilized: None     Chair to chair: Complete Independence  Assistive device utilized: None       RAMP:  Not tested  CURB:  Not tested  STAIRS: Findings: Level of Assistance: SBA, Stair  Negotiation Technique: Alternating Pattern  with Bilateral Rails, Number of Stairs: 4, Height of Stairs: 6   , and Comments:   GAIT: Findings: Gait Characteristics: decreased pelvic rotation, step through pattern, and wide BOS, Distance walked: >51ft, Assistive device utilized:None, Level of assistance: Complete Independence, and Comments:    FUNCTIONAL TESTS:  30 seconds chair stand test: 16 mild SOB  6 minute walk test:  to be completed.  10 meter walk test: 7.9 sec and 8.3  Mini-BESTest: 19 Functional gait assessment: 20   [facility] PT Assessment - 07/10/23 0001       Mini-BESTest   Sit To Stand Normal: Comes to stand without use of hands and stabilizes independently.    Rise to Toes Normal: Stable for 3 s with maximum height.    Stand on one leg (left) Moderate: <  20 s    Stand on one leg (right) Moderate: < 20 s    Stand on one leg - lowest score 1    Compensatory Stepping Correction - Forward Moderate: More than one step is required to recover equilibrium    Compensatory Stepping Correction - Backward No step, OR would fall if not caught, OR falls spontaneously.    Compensatory Stepping Correction - Left Lateral Moderate: Several steps to recover equilibrium    Compensatory Stepping Correction - Right Lateral Moderate: Several steps to recover equilibrium    Stepping Corredtion Lateral - lowest score 1    Stance - Feet together, eyes open, firm surface  Normal: 30s    Stance - Feet together, eyes closed, foam surface  Moderate: < 30s    Incline - Eyes Closed Moderate: Stands independently < 30s OR aligns with surface    Change in Gait Speed Normal: Significantly changes walkling speed without imbalance    Walk with head turns - Horizontal Moderate: performs head turns with reduction in gait speed.    Walk with pivot turns Normal: Turns with feet close FAST (< 3 steps) with good balance.    Step over obstacles Normal: Able to step over box with minimal change of gait speed and with  good balance.    Timed UP & GO with Dual Task Moderate: Dual Task affects either counting OR walking (>10%) when compared to the TUG without Dual Task.    Mini-BEST total score 19      Functional Gait  Assessment   Gait Level Surface Walks 20 ft in less than 5.5 sec, no assistive devices, good speed, no evidence for imbalance, normal gait pattern, deviates no more than 6 in outside of the 12 in walkway width.    Change in Gait Speed Able to smoothly change walking speed without loss of balance or gait deviation. Deviate no more than 6 in outside of the 12 in walkway width.    Gait with Horizontal Head Turns Performs head turns smoothly with slight change in gait velocity (eg, minor disruption to smooth gait path), deviates 6-10 in outside 12 in walkway width, or uses an assistive device.    Gait with Vertical Head Turns Performs head turns with no change in gait. Deviates no more than 6 in outside 12 in walkway width.    Gait and Pivot Turn Turns slowly, requires verbal cueing, or requires several small steps to catch balance following turn and stop    Step Over Obstacle Is able to step over 2 stacked shoe boxes taped together (9 in total height) without changing gait speed. No evidence of imbalance.    Gait with Narrow Base of Support Ambulates 4-7 steps.    Gait with Eyes Closed Cannot walk 20 ft without assistance, severe gait deviations or imbalance, deviates greater than 15 in outside 12 in walkway width or will not attempt task.    Ambulating Backwards Walks 20 ft, uses assistive device, slower speed, mild gait deviations, deviates 6-10 in outside 12 in walkway width.    Steps Alternating feet, must use rail.    Total Score 20             PATIENT SURVEYS:  ABC scale 88%  TREATMENT DATE: 07/10/2023     PATIENT EDUCATION: Education details: POC. HEP. Pt  educated throughout session about proper posture and technique with exercises. Improved exercise technique, movement at target joints, use of target muscles after min verbal, visual, tactile cues.  Person educated: Patient Education method: Explanation, Demonstration, and Handouts Education comprehension: verbalized understanding and returned demonstration  HOME EXERCISE PROGRAM: Access Code: [email or web address] URL: [email or web address] Date: 07/10/2023 Prepared by: Aurora Lees  Exercises - Feet Together Balance at Counter Top Eyes Closed  - 1 x daily - 4 x weekly - 3 sets - 3 reps - 30 sec  hold - Standing Tandem Balance with Counter Support  - 1 x daily - 4 x weekly - 3 sets - 4 reps - 20 sec  hold - Standing Single Leg Stance with Counter Support  - 1 x daily - 4 x weekly - 3 sets - 4 reps - 10 sec  hold  GOALS: Goals reviewed with patient? Yes  SHORT TERM GOALS: Target date: 08/14/2023    Patient will be independent in home exercise program to improve strength/mobility for better functional independence with ADLs. Baseline: provided on 6/11 Goal status: INITIAL   LONG TERM GOALS: Target date: 10/02/2023    Patient will increase ABC score by equal to or greater than  6 points   to demonstrate statistically significant improvement in mobility and quality of life.  Baseline: 88% Goal status: INITIAL  2.  Patient (> 9 years old) will increase 30 sec sit to stand by 4 repetitions  indicating an increased LE strength and improved balance. Baseline: 16 Goal status: INITIAL  3.  Patient will increase FGA score to >22 points to demonstrate decreased fall risk during functional activities Baseline: 20 Goal status: INITIAL  4.  Patient will increase MiniBest test  10 meter walk test to >1.83m/s as to improve gait speed for better community ambulation and to reduce fall risk.  Baseline: 19 Goal status: INITIAL    5.  Patient will increase 6 min walk test by >11ft  to  demonstrate reduced fall risk and improved dynamic gait balance for better safety with community/home ambulation.   Baseline: to be completed  Goal status: INITIAL   ASSESSMENT:  CLINICAL IMPRESSION: Patient is an 89 y.o. Female who was seen today for physical therapy evaluation and treatment for balance deficits and neuropathy. Pt also has hx of BPPV, which still needs to be ruled out for affecting balance deficits. Increased fall risk evidenced by decreased score of ABC, MiniBest and FGA. Greatest difficulty with balance and gait in narrow BOS and eyes closed. Pt will benefit from skilled PT to address balance deficits and determine affects of Vertigo on unsteady feeling with ambulation; to  allow improved safety and   OBJECTIVE IMPAIRMENTS: Abnormal gait, cardiopulmonary status limiting activity, decreased activity tolerance, decreased balance, decreased coordination, decreased endurance, decreased knowledge of use of DME, decreased mobility, difficulty walking, decreased strength, hypomobility, and impaired flexibility.   ACTIVITY LIMITATIONS: carrying, lifting, bending, standing, squatting, stairs, transfers, and locomotion level  PARTICIPATION LIMITATIONS: cleaning, laundry, and community activity  PERSONAL FACTORS: Age and 1-2 comorbidities: chronic joint pain. HTN are also affecting patient's functional outcome.   REHAB POTENTIAL: Excellent  CLINICAL DECISION MAKING: Stable/uncomplicated  EVALUATION COMPLEXITY: Low  PLAN:  PT FREQUENCY: 1-2x/week  PT DURATION: 12 weeks  PLANNED INTERVENTIONS: 97164- PT Re-evaluation, 97750- Physical Performance Testing, 97110-Therapeutic exercises, 97530- Therapeutic activity, [id number]- Neuromuscular re-education, 97535- Self Care, 54098- Manual therapy, [id number]- Gait training, [phone]- Electrical  stimulation (unattended), [phone]- Electrical stimulation (manual), Patient/Family education, Balance training, Stair training, Joint mobilization, Joint  manipulation, Vestibular training, DME instructions, Cryotherapy, and Moist heat  PLAN FOR NEXT SESSION:  Vestibular Screen.  6 min walk test.  Review and expand HEP.    Barbara Book, PT 07/10/2023, 1:55 PM

## 2023-07-16 ENCOUNTER — Ambulatory Visit: Admitting: Physical Therapy

## 2023-07-16 DIAGNOSIS — R269 Unspecified abnormalities of gait and mobility: Secondary | ICD-10-CM | POA: Diagnosis not present

## 2023-07-16 DIAGNOSIS — M1611 Unilateral primary osteoarthritis, right hip: Secondary | ICD-10-CM | POA: Diagnosis not present

## 2023-07-16 DIAGNOSIS — R262 Difficulty in walking, not elsewhere classified: Secondary | ICD-10-CM

## 2023-07-16 DIAGNOSIS — M25551 Pain in right hip: Secondary | ICD-10-CM | POA: Diagnosis not present

## 2023-07-16 DIAGNOSIS — R2689 Other abnormalities of gait and mobility: Secondary | ICD-10-CM

## 2023-07-16 NOTE — Therapy (Addendum)
 OUTPATIENT PHYSICAL THERAPY NEURO Treatment    Patient Name: Laura [name] MRN: [id number] DOB:1932/07/17, 89 y.o., female Today's Date: 07/16/2023   PCP:    Marylynn Verneita CROME, MD   REFERRING PROVIDER:    Marylynn Verneita CROME, MD    END OF SESSION:  PT End of Session - 07/16/23 1313     Visit Number 2    Number of Visits 24    Date for PT Re-Evaluation 10/02/23    PT Start Time 1316    PT Stop Time 1356    PT Time Calculation (min) 40 min    Equipment Utilized During Treatment Gait belt    Activity Tolerance Patient tolerated treatment well    Behavior During Therapy [facility] for tasks assessed/performed          Past Medical History:  Diagnosis Date   Accidental fall 10/16/2013   Anxiety    Carotid stenosis    Left    COVID-19 virus infection 09/14/2020   Fracture of rib 09/26/2019   High cholesterol    Hypertension    Pt states that her blood pressure is very well controlled   Migraine with aura    Neuropathy    Osteoporosis    Past Surgical History:  Procedure Laterality Date   ABDOMINAL HYSTERECTOMY     secondary to peritonitis and complications    APPENDECTOMY     CATARACT EXTRACTION W/PHACO Left 06/27/2022   Procedure: CATARACT EXTRACTION PHACO AND INTRAOCULAR LENS PLACEMENT (IOC) LEFT 6.36 00:43.1;  Surgeon: Mittie Gaskin, MD;  Location: [facility];  Service: Ophthalmology;  Laterality: Left;   CATARACT EXTRACTION W/PHACO Right 07/11/2022   Procedure: CATARACT EXTRACTION PHACO AND INTRAOCULAR LENS PLACEMENT (IOC) RIGHT 8.35 00:33.9;  Surgeon: Mittie Gaskin, MD;  Location: [facility];  Service: Ophthalmology;  Laterality: Right;   TONSILLECTOMY     TUBAL LIGATION     at age 72, vaginal complicated by peritonitis   Patient Active Problem List   Diagnosis Date Noted   Gallbladder mass 05/23/2023   Decreased GFR 01/13/2023   Vitamin D  deficiency 06/14/2022   Pain of right midfoot 06/11/2022   Right medial knee pain 06/11/2022    Pulsatile tinnitus 02/02/2022   Hemorrhoid 06/11/2021   History of rib fracture 01/07/2021   Cervical radiculopathy 01/07/2021   Aortic atherosclerosis (HCC) 05/02/2020   Dizziness 12/31/2018   Degenerative joint disease (DJD) of hip 11/29/2018   Bursitis of left hip 05/31/2018   Encounter for preventive health examination 07/31/2016   DDD (degenerative disc disease), lumbar 12/03/2014   Intracranial arachnoid cyst 10/25/2014   Low back pain 08/20/2014   Migraine aura without headache 04/24/2014   Encounter for Medicare annual wellness exam 11/01/2013   S/P hysterectomy with oophorectomy 01/03/2013   HTN (hypertension) 10/19/2012   B12 deficiency 04/16/2012   Generalized anxiety disorder 04/16/2012   Neuropathy of both feet 09/18/2011   Hyperlipidemia LDL goal <70 03/21/2011   Insomnia due to anxiety and fear 03/21/2011   Osteoporosis     ONSET DATE: `6 months.   REFERRING DIAG:  Diagnosis  R26.89 (ICD-10-CM) - Balance problem    THERAPY DIAG:  Abnormality of gait and mobility  Difficulty in walking, not elsewhere classified  Balance disorder  Rationale for Evaluation and Treatment: Rehabilitation  SUBJECTIVE:  SUBJECTIVE STATEMENT:  Reports that she had a good weekend. No pain. No dizziness, no falls, trips or stumbles since Last PT treatment.  States hx of central canal BPPV that PT daughter has resolved in the past. Also states dizziness when standing and turning to the L quickly from dinning Room table.   From Eval: Pt states that she has had BPPV in the past. Does not have dizzness with spinning sensation, but has difficulty with maintaining straight path when walking slow. Finds herself veering to the R and L.  Does Yoga regularly, but feels a little off balance compared to  4-5 years ago.  Does have hx of Migraines  with aura and mild aphasia following that passes after a few minute. >4 years since migraine with aphasia.   Pt accompanied by: self  PERTINENT HISTORY:  Pt with hx of BPPV, Migraines, Back, pain, knee pain, DDD, and neuropathy presents to PT with increased balance deficits and difficulty with balance for long distance ambulation.  PAIN:  Are you having pain? No a little ache in the R knee. Does not rate.   PRECAUTIONS: Fall  RED FLAGS: None   WEIGHT BEARING RESTRICTIONS: No  FALLS: Has patient fallen in last 6 months? No  LIVING ENVIRONMENT: Lives with: lives alone daughter lives in town Lives in: House/apartment Stairs: Yes: External: 3-5 steps; on right going up, on left going up, and bilateral but cannot reach both Has following equipment at home: walking stick of long distances.   PLOF: Independent, Independent with basic ADLs, and Independent with household mobility without device  PATIENT GOALS: Be able to feel comfortable and confident with walking.  OBJECTIVE:  Note: Objective measures were completed at Evaluation unless otherwise noted.  DIAGNOSTIC FINDINGS: none recent.   COGNITION: Overall cognitive status: Within functional limits for tasks assessed   SENSATION: Light touch: Impaired  hx of neuropathy in BLE. In the soles of feel and slightly on the top of the foot.   COORDINATION: Grossly WFL with formal testing.    POSTURE: rounded shoulders  LOWER EXTREMITY ROM:     Grossly WFL  LOWER EXTREMITY MMT:    MMT Right Eval Left Eval  Hip flexion 4 4  Hip extension    Hip abduction 4+ 4+  Hip adduction 5 5  Hip internal rotation    Hip external rotation    Knee flexion 4+ 4+  Knee extension 5 5  Ankle dorsiflexion 4+ 4+  Ankle plantarflexion    Ankle inversion    Ankle eversion    (Blank rows = not tested)  BED MOBILITY:  Not tested  TRANSFERS: Sit to stand: Complete Independence  Assistive  device utilized: None     Stand to sit: Complete Independence  Assistive device utilized: None     Chair to chair: Complete Independence  Assistive device utilized: None       RAMP:  Not tested  CURB:  Not tested  STAIRS: Findings: Level of Assistance: SBA, Stair Negotiation Technique: Alternating Pattern  with Bilateral Rails, Number of Stairs: 4, Height of Stairs: 6   , and Comments:   GAIT: Findings: Gait Characteristics: decreased pelvic rotation, step through pattern, and wide BOS, Distance walked: >66ft, Assistive device utilized:None, Level of assistance: Complete Independence, and Comments:    FUNCTIONAL TESTS:  30 seconds chair stand test: 16 mild SOB  6 minute walk test:  to be completed.  10 meter walk test: 7.9 sec and 8.3  Mini-BESTest: 19 Functional gait assessment: 20  PATIENT SURVEYS:  ABC scale 88%                                                                                                                              TREATMENT DATE: 07/16/2023  6 Min Walk Test:  Instructed patient to ambulate as quickly and as safely as possible for 6 minutes using LRAD. Patient was allowed to take standing rest breaks without stopping the test, but if the patient required a sitting rest break the clock would be stopped and the test would be over.  Results: 1696 feet (516.9 meters) using no AD without assist. Results indicate that the patient has reduced endurance with ambulation compared to age matched norms.  Age Matched Norms: 69-69 yo M: 4 F: 25, 66-79 yo M: 26 F: 471, 64-89 yo M: 417 F: 392 MDC: 58.21 meters (190.98 feet) or 50 meters (ANPTA Core Set of Outcome Measures for Adults with Neurologic Conditions, 2018)  Vestibular screen:  VOR 1 30 sec.  VOR 2 x 30 sec  Diagonal VOR performed 30 sec each.  No s/s of vertigo or dizziness.    HEP review.  Narrow standing with eyes closed 2 x 15 bil  Tandem stance 2 x 20 sec bil  SLS 2 x 20 sec bil  Performed  without UE support and required min assist for balance. Instructed pt to perform these tasks with light UE support at home on counter.   Pt reports hx of central canal vertigo.  Pt instructed pt in BBQ roll for management of BPPV in central canal. Pt noted mild s/s lasting ~ 30 sec with transition from prone to lying the R side.  Pt then performs quick sit>stand with turn to the L, Which can cause s/s. States no s/s on this day.    PATIENT EDUCATION: Education details: POC. HEP. Pt educated throughout session about proper posture and technique with exercises. Improved exercise technique, movement at target joints, use of target muscles after min verbal, visual, tactile cues. Safety with use of UE support for HEP to improve safety and confidence of high level balance tasks.   Person educated: Patient Education method: Explanation, Demonstration, and Handouts Education comprehension: verbalized understanding and returned demonstration  HOME EXERCISE PROGRAM: Access Code: [email or web address] URL: [email or web address] Date: 07/10/2023 Prepared by: Massie Dollar  Exercises - Feet Together Balance at Counter Top Eyes Closed  - 1 x daily - 4 x weekly - 3 sets - 3 reps - 30 sec  hold - Standing Tandem Balance with Counter Support  - 1 x daily - 4 x weekly - 3 sets - 4 reps - 20 sec  hold - Standing Single Leg Stance with Counter Support  - 1 x daily - 4 x weekly - 3 sets - 4 reps - 10 sec  hold  GOALS: Goals reviewed with patient? Yes  SHORT TERM GOALS: Target date: 08/14/2023    Patient will be independent in home exercise program  to improve strength/mobility for better functional independence with ADLs. Baseline: provided on 6/11 Goal status: INITIAL   LONG TERM GOALS: Target date: 10/02/2023    Patient will increase ABC score by equal to or greater than  6 points   to demonstrate statistically significant improvement in mobility and quality of life.  Baseline: 88% Goal status:  INITIAL  2.  Patient (> 63 years old) will increase 30 sec sit to stand by 4 repetitions  indicating an increased LE strength and improved balance. Baseline: 16 Goal status: INITIAL  3.  Patient will increase FGA score to >22 points to demonstrate decreased fall risk during functional activities Baseline: 20 Goal status: INITIAL  4.  Patient will increase MiniBest test  10 meter walk test to >1.28m/s as to improve gait speed for better community ambulation and to reduce fall risk.  Baseline: 19 Goal status: INITIAL    5.  Patient will increase 6 min walk test by >139ft  to demonstrate reduced fall risk and improved dynamic gait balance for better safety with community/home ambulation.   Baseline: 1696 feet Goal status: INITIAL   ASSESSMENT:  CLINICAL IMPRESSION: Patient is an 89 y.o. Female who was seen today for physical therapy treatment for balance deficits and neuropathy. PT performed [place] roll for central canal BPPV, mild s/s. Pt noted reduced dizziness symptomatic movement upon completion. Reviewed HEP and education provided for use of UE support at this time for safety and improved motor control with high level balance tasks. Pt will benefit from skilled PT to address balance deficits and determine affects of Vertigo on unsteady feeling with ambulation; to  allow improved safety and improved overall QoL.   OBJECTIVE IMPAIRMENTS: Abnormal gait, cardiopulmonary status limiting activity, decreased activity tolerance, decreased balance, decreased coordination, decreased endurance, decreased knowledge of use of DME, decreased mobility, difficulty walking, decreased strength, hypomobility, and impaired flexibility.   ACTIVITY LIMITATIONS: carrying, lifting, bending, standing, squatting, stairs, transfers, and locomotion level  PARTICIPATION LIMITATIONS: cleaning, laundry, and community activity  PERSONAL FACTORS: Age and 1-2 comorbidities: chronic joint pain. HTN are also  affecting patient's functional outcome.   REHAB POTENTIAL: Excellent  CLINICAL DECISION MAKING: Stable/uncomplicated  EVALUATION COMPLEXITY: Low  PLAN:  PT FREQUENCY: 1-2x/week  PT DURATION: 12 weeks  PLANNED INTERVENTIONS: 97164- PT Re-evaluation, 97750- Physical Performance Testing, 97110-Therapeutic exercises, 97530- Therapeutic activity, [id number]- Neuromuscular re-education, 97535- Self Care, 02859- Manual therapy, [phone]- Gait training, [phone]- Electrical stimulation (unattended), [phone]- Electrical stimulation (manual), Patient/Family education, Balance training, Stair training, Joint mobilization, Joint manipulation, Vestibular training, DME instructions, Cryotherapy, and Moist heat  PLAN FOR NEXT SESSION:  Vestibular Screen.  6 min walk test.  Review and expand HEP.    Massie FORBES Dollar, PT 07/16/2023, 1:15 PM

## 2023-07-18 ENCOUNTER — Ambulatory Visit

## 2023-07-23 ENCOUNTER — Ambulatory Visit: Admitting: Physical Therapy

## 2023-07-23 DIAGNOSIS — R2689 Other abnormalities of gait and mobility: Secondary | ICD-10-CM

## 2023-07-23 DIAGNOSIS — R262 Difficulty in walking, not elsewhere classified: Secondary | ICD-10-CM

## 2023-07-23 DIAGNOSIS — M25551 Pain in right hip: Secondary | ICD-10-CM

## 2023-07-23 DIAGNOSIS — R269 Unspecified abnormalities of gait and mobility: Secondary | ICD-10-CM | POA: Diagnosis not present

## 2023-07-23 DIAGNOSIS — M1611 Unilateral primary osteoarthritis, right hip: Secondary | ICD-10-CM | POA: Diagnosis not present

## 2023-07-23 NOTE — Therapy (Addendum)
 OUTPATIENT PHYSICAL THERAPY NEURO Treatment    Patient Name: Laura [name] MRN: [id number] DOB:1932/06/30, 89 y.o., female Today's Date: 07/23/2023   PCP:    Marylynn Verneita CROME, MD   REFERRING PROVIDER:    Marylynn Verneita CROME, MD    END OF SESSION:  PT End of Session - 07/23/23 1306     Visit Number 3    Number of Visits 24    Date for PT Re-Evaluation 10/02/23    PT Start Time 1318    PT Stop Time 1358    PT Time Calculation (min) 40 min    Equipment Utilized During Treatment Gait belt    Activity Tolerance Patient tolerated treatment well    Behavior During Therapy [facility] for tasks assessed/performed          Past Medical History:  Diagnosis Date   Accidental fall 10/16/2013   Anxiety    Carotid stenosis    Left    COVID-19 virus infection 09/14/2020   Fracture of rib 09/26/2019   High cholesterol    Hypertension    Pt states that her blood pressure is very well controlled   Migraine with aura    Neuropathy    Osteoporosis    Past Surgical History:  Procedure Laterality Date   ABDOMINAL HYSTERECTOMY     secondary to peritonitis and complications    APPENDECTOMY     CATARACT EXTRACTION W/PHACO Left 06/27/2022   Procedure: CATARACT EXTRACTION PHACO AND INTRAOCULAR LENS PLACEMENT (IOC) LEFT 6.36 00:43.1;  Surgeon: Mittie Gaskin, MD;  Location: [facility];  Service: Ophthalmology;  Laterality: Left;   CATARACT EXTRACTION W/PHACO Right 07/11/2022   Procedure: CATARACT EXTRACTION PHACO AND INTRAOCULAR LENS PLACEMENT (IOC) RIGHT 8.35 00:33.9;  Surgeon: Mittie Gaskin, MD;  Location: [facility];  Service: Ophthalmology;  Laterality: Right;   TONSILLECTOMY     TUBAL LIGATION     at age 18, vaginal complicated by peritonitis   Patient Active Problem List   Diagnosis Date Noted   Gallbladder mass 05/23/2023   Decreased GFR 01/13/2023   Vitamin D  deficiency 06/14/2022   Pain of right midfoot 06/11/2022   Right medial knee pain 06/11/2022    Pulsatile tinnitus 02/02/2022   Hemorrhoid 06/11/2021   History of rib fracture 01/07/2021   Cervical radiculopathy 01/07/2021   Aortic atherosclerosis (HCC) 05/02/2020   Dizziness 12/31/2018   Degenerative joint disease (DJD) of hip 11/29/2018   Bursitis of left hip 05/31/2018   Encounter for preventive health examination 07/31/2016   DDD (degenerative disc disease), lumbar 12/03/2014   Intracranial arachnoid cyst 10/25/2014   Low back pain 08/20/2014   Migraine aura without headache 04/24/2014   Encounter for Medicare annual wellness exam 11/01/2013   S/P hysterectomy with oophorectomy 01/03/2013   HTN (hypertension) 10/19/2012   B12 deficiency 04/16/2012   Generalized anxiety disorder 04/16/2012   Neuropathy of both feet 09/18/2011   Hyperlipidemia LDL goal <70 03/21/2011   Insomnia due to anxiety and fear 03/21/2011   Osteoporosis     ONSET DATE: `6 months.   REFERRING DIAG:  Diagnosis  R26.89 (ICD-10-CM) - Balance problem    THERAPY DIAG:  Abnormality of gait and mobility  Difficulty in walking, not elsewhere classified  Balance disorder  Pain in right hip  Primary osteoarthritis of right hip  Rationale for Evaluation and Treatment: Rehabilitation  SUBJECTIVE:  SUBJECTIVE STATEMENT:  07/23/2023: Reports that she spent time with family over the last week, so she was a little less consistent with HEP.  Reports that dizziness has been about the same since last session. But slightly improved with standing and turning to the L.  Also states that she feels like her balance is improving despite no reduction in dizziness.     From Eval: Pt states that she has had BPPV in the past. Does not have dizzness with spinning sensation, but has difficulty with maintaining straight path  when walking slow. Finds herself veering to the R and L.  Does Yoga regularly, but feels a little off balance compared to 4-5 years ago.  Does have hx of Migraines  with aura and mild aphasia following that passes after a few minute. >4 years since migraine with aphasia.   Pt accompanied by: self  PERTINENT HISTORY:  Pt with hx of BPPV, Migraines, Back, pain, knee pain, DDD, and neuropathy presents to PT with increased balance deficits and difficulty with balance for long distance ambulation.  PAIN:  Are you having pain? No a little ache in the R knee. Does not rate.   PRECAUTIONS: Fall  RED FLAGS: None   WEIGHT BEARING RESTRICTIONS: No  FALLS: Has patient fallen in last 6 months? No  LIVING ENVIRONMENT: Lives with: lives alone daughter lives in town Lives in: House/apartment Stairs: Yes: External: 3-5 steps; on right going up, on left going up, and bilateral but cannot reach both Has following equipment at home: walking stick of long distances.   PLOF: Independent, Independent with basic ADLs, and Independent with household mobility without device  PATIENT GOALS: Be able to feel comfortable and confident with walking.  OBJECTIVE:  Note: Objective measures were completed at Evaluation unless otherwise noted.  DIAGNOSTIC FINDINGS: none recent.   COGNITION: Overall cognitive status: Within functional limits for tasks assessed   SENSATION: Light touch: Impaired  hx of neuropathy in BLE. In the soles of feel and slightly on the top of the foot.   COORDINATION: Grossly WFL with formal testing.    POSTURE: rounded shoulders  LOWER EXTREMITY ROM:     Grossly WFL  LOWER EXTREMITY MMT:    MMT Right Eval Left Eval  Hip flexion 4 4  Hip extension    Hip abduction 4+ 4+  Hip adduction 5 5  Hip internal rotation    Hip external rotation    Knee flexion 4+ 4+  Knee extension 5 5  Ankle dorsiflexion 4+ 4+  Ankle plantarflexion    Ankle inversion    Ankle eversion     (Blank rows = not tested)  BED MOBILITY:  Not tested  TRANSFERS: Sit to stand: Complete Independence  Assistive device utilized: None     Stand to sit: Complete Independence  Assistive device utilized: None     Chair to chair: Complete Independence  Assistive device utilized: None       RAMP:  Not tested  CURB:  Not tested  STAIRS: Findings: Level of Assistance: SBA, Stair Negotiation Technique: Alternating Pattern  with Bilateral Rails, Number of Stairs: 4, Height of Stairs: 6   , and Comments:   GAIT: Findings: Gait Characteristics: decreased pelvic rotation, step through pattern, and wide BOS, Distance walked: >43ft, Assistive device utilized:None, Level of assistance: Complete Independence, and Comments:    FUNCTIONAL TESTS:  30 seconds chair stand test: 16 mild SOB  6 minute walk test:  to be completed.  10 meter walk test:  7.9 sec and 8.3  Mini-BESTest: 19 Functional gait assessment: 20     PATIENT SURVEYS:  ABC scale 88%                                                                                                                              TREATMENT DATE: 07/23/2023   Gait without resistance x 569ft  Tandem eyes open 2 x 20 sec   Airex pad  Normal BOS/Narrow BOS 2 x 30 sec each Normal BOS eyes open/eyes closed 2 x 20 sec  Narrow BOS eyes open/etes closed 2 x 20 sec  Normal BOS head turns x 10  Normal BOS head nods x 10   Agility ladder:  1 foot in each space x 8 laps difficulty with increased step length and decreased speed.  SLS for 3 sec hold in each space x 2 laps  Side stepping R and L x 4 laps  Side stepping, in/out x 2 laps.   CGA for safety throughout except with SLS hold requiring min assist from PT.   PATIENT EDUCATION: Education details: POC. HEP. Pt educated throughout session about proper posture and technique with exercises. Improved exercise technique, movement at target joints, use of target muscles after min verbal, visual, tactile  cues. Safety with use of UE support for HEP to improve safety and confidence of high level balance tasks.   Person educated: Patient Education method: Explanation, Demonstration, and Handouts Education comprehension: verbalized understanding and returned demonstration  HOME EXERCISE PROGRAM: Access Code: [email or web address] URL: [email or web address] Date: 07/10/2023 Prepared by: Massie Dollar  Exercises - Feet Together Balance at Counter Top Eyes Closed  - 1 x daily - 4 x weekly - 3 sets - 3 reps - 30 sec  hold - Standing Tandem Balance with Counter Support  - 1 x daily - 4 x weekly - 3 sets - 4 reps - 20 sec  hold - Standing Single Leg Stance with Counter Support  - 1 x daily - 4 x weekly - 3 sets - 4 reps - 10 sec  hold  GOALS: Goals reviewed with patient? Yes  SHORT TERM GOALS: Target date: 08/14/2023    Patient will be independent in home exercise program to improve strength/mobility for better functional independence with ADLs. Baseline: provided on 6/11 Goal status: INITIAL   LONG TERM GOALS: Target date: 10/02/2023    Patient will increase ABC score by equal to or greater than  6 points   to demonstrate statistically significant improvement in mobility and quality of life.  Baseline: 88% Goal status: INITIAL  2.  Patient (> 75 years old) will increase 30 sec sit to stand by 4 repetitions  indicating an increased LE strength and improved balance. Baseline: 16 Goal status: INITIAL  3.  Patient will increase FGA score to >22 points to demonstrate decreased fall risk during functional activities Baseline: 20 Goal status: INITIAL  4.  Patient will increase MiniBest test  10 meter  walk test to >1.43m/s as to improve gait speed for better community ambulation and to reduce fall risk.  Baseline: 19 Goal status: INITIAL    5.  Patient will increase 6 min walk test by >157ft  to demonstrate reduced fall risk and improved dynamic gait balance for better safety with  community/home ambulation.   Baseline: 1696 feet Goal status: INITIAL   ASSESSMENT:  CLINICAL IMPRESSION: Patient is an 89 y.o. Female who was seen today for physical therapy treatment for balance deficits and neuropathy. PT treatment continues to focus on improved static and dynamic balance with emphasis on improved balance with narrow stance, and improved step length. Min assist provided for SLS, but improved control with increased repetitions.  Pt will benefit from skilled PT to address balance deficits and determine affects of Vertigo on unsteady feeling with ambulation; to  allow improved safety and improved overall QoL.   OBJECTIVE IMPAIRMENTS: Abnormal gait, cardiopulmonary status limiting activity, decreased activity tolerance, decreased balance, decreased coordination, decreased endurance, decreased knowledge of use of DME, decreased mobility, difficulty walking, decreased strength, hypomobility, and impaired flexibility.   ACTIVITY LIMITATIONS: carrying, lifting, bending, standing, squatting, stairs, transfers, and locomotion level  PARTICIPATION LIMITATIONS: cleaning, laundry, and community activity  PERSONAL FACTORS: Age and 1-2 comorbidities: chronic joint pain. HTN are also affecting patient's functional outcome.   REHAB POTENTIAL: Excellent  CLINICAL DECISION MAKING: Stable/uncomplicated  EVALUATION COMPLEXITY: Low  PLAN:  PT FREQUENCY: 1-2x/week  PT DURATION: 12 weeks  PLANNED INTERVENTIONS: 97164- PT Re-evaluation, 97750- Physical Performance Testing, 97110-Therapeutic exercises, 97530- Therapeutic activity, [id number]- Neuromuscular re-education, 97535- Self Care, 02859- Manual therapy, [phone]- Gait training, [phone]- Electrical stimulation (unattended), [phone]- Electrical stimulation (manual), Patient/Family education, Balance training, Stair training, Joint mobilization, Joint manipulation, Vestibular training, DME instructions, Cryotherapy, and Moist heat  PLAN FOR NEXT  SESSION:   Continue high level dynamic balance forcing decreased speed of movement.  SLS and narrow BOS tasks.    Massie FORBES Dollar, PT 07/23/2023, 1:07 PM

## 2023-07-25 ENCOUNTER — Ambulatory Visit: Admitting: Physical Therapy

## 2023-07-25 DIAGNOSIS — R2689 Other abnormalities of gait and mobility: Secondary | ICD-10-CM | POA: Diagnosis not present

## 2023-07-25 DIAGNOSIS — M1611 Unilateral primary osteoarthritis, right hip: Secondary | ICD-10-CM | POA: Diagnosis not present

## 2023-07-25 DIAGNOSIS — M25551 Pain in right hip: Secondary | ICD-10-CM

## 2023-07-25 DIAGNOSIS — R269 Unspecified abnormalities of gait and mobility: Secondary | ICD-10-CM | POA: Diagnosis not present

## 2023-07-25 DIAGNOSIS — R262 Difficulty in walking, not elsewhere classified: Secondary | ICD-10-CM | POA: Diagnosis not present

## 2023-07-25 NOTE — Therapy (Addendum)
 OUTPATIENT PHYSICAL THERAPY NEURO Treatment    Patient Name: PALYN SCRIMA MRN: [id number] DOB:17-Oct-1932, 89 y.o., female Today's Date: 07/25/2023   PCP:    Marylynn Verneita CROME, MD   REFERRING PROVIDER:    Marylynn Verneita CROME, MD    END OF SESSION:  PT End of Session - 07/25/23 1622     Visit Number 4    Number of Visits 24    Date for PT Re-Evaluation 10/02/23    PT Start Time 1621    PT Stop Time 1701    PT Time Calculation (min) 40 min    Equipment Utilized During Treatment Gait belt    Activity Tolerance Patient tolerated treatment well    Behavior During Therapy [facility] for tasks assessed/performed          Past Medical History:  Diagnosis Date   Accidental fall 10/16/2013   Anxiety    Carotid stenosis    Left    COVID-19 virus infection 09/14/2020   Fracture of rib 09/26/2019   High cholesterol    Hypertension    Pt states that her blood pressure is very well controlled   Migraine with aura    Neuropathy    Osteoporosis    Past Surgical History:  Procedure Laterality Date   ABDOMINAL HYSTERECTOMY     secondary to peritonitis and complications    APPENDECTOMY     CATARACT EXTRACTION W/PHACO Left 06/27/2022   Procedure: CATARACT EXTRACTION PHACO AND INTRAOCULAR LENS PLACEMENT (IOC) LEFT 6.36 00:43.1;  Surgeon: Mittie Gaskin, MD;  Location: [facility];  Service: Ophthalmology;  Laterality: Left;   CATARACT EXTRACTION W/PHACO Right 07/11/2022   Procedure: CATARACT EXTRACTION PHACO AND INTRAOCULAR LENS PLACEMENT (IOC) RIGHT 8.35 00:33.9;  Surgeon: Mittie Gaskin, MD;  Location: [facility];  Service: Ophthalmology;  Laterality: Right;   TONSILLECTOMY     TUBAL LIGATION     at age 55, vaginal complicated by peritonitis   Patient Active Problem List   Diagnosis Date Noted   Gallbladder mass 05/23/2023   Decreased GFR 01/13/2023   Vitamin D  deficiency 06/14/2022   Pain of right midfoot 06/11/2022   Right medial knee pain 06/11/2022    Pulsatile tinnitus 02/02/2022   Hemorrhoid 06/11/2021   History of rib fracture 01/07/2021   Cervical radiculopathy 01/07/2021   Aortic atherosclerosis (HCC) 05/02/2020   Dizziness 12/31/2018   Degenerative joint disease (DJD) of hip 11/29/2018   Bursitis of left hip 05/31/2018   Encounter for preventive health examination 07/31/2016   DDD (degenerative disc disease), lumbar 12/03/2014   Intracranial arachnoid cyst 10/25/2014   Low back pain 08/20/2014   Migraine aura without headache 04/24/2014   Encounter for Medicare annual wellness exam 11/01/2013   S/P hysterectomy with oophorectomy 01/03/2013   HTN (hypertension) 10/19/2012   B12 deficiency 04/16/2012   Generalized anxiety disorder 04/16/2012   Neuropathy of both feet 09/18/2011   Hyperlipidemia LDL goal <70 03/21/2011   Insomnia due to anxiety and fear 03/21/2011   Osteoporosis     ONSET DATE: `6 months.   REFERRING DIAG:  Diagnosis  R26.89 (ICD-10-CM) - Balance problem    THERAPY DIAG:  Abnormality of gait and mobility  Difficulty in walking, not elsewhere classified  Balance disorder  Pain in right hip  Primary osteoarthritis of right hip  Rationale for Evaluation and Treatment: Rehabilitation  SUBJECTIVE:  SUBJECTIVE STATEMENT:   07/25/2023: Reports that she had a busy Day. Getting ready to go the [facility]. Including house hold chores.  Also went shoe shopping. States that she mostly wears sandals without back. And wants to trial balance training in sandals.     From Eval: Pt states that she has had BPPV in the past. Does not have dizzness with spinning sensation, but has difficulty with maintaining straight path when walking slow. Finds herself veering to the R and L.  Does Yoga regularly, but feels a little off balance  compared to 4-5 years ago.  Does have hx of Migraines  with aura and mild aphasia following that passes after a few minute. >4 years since migraine with aphasia.   Pt accompanied by: self  PERTINENT HISTORY:  Pt with hx of BPPV, Migraines, Back, pain, knee pain, DDD, and neuropathy presents to PT with increased balance deficits and difficulty with balance for long distance ambulation.  PAIN:  Are you having pain? No a little ache in the R knee. Does not rate.   PRECAUTIONS: Fall  RED FLAGS: None   WEIGHT BEARING RESTRICTIONS: No  FALLS: Has patient fallen in last 6 months? No  LIVING ENVIRONMENT: Lives with: lives alone daughter lives in town Lives in: House/apartment Stairs: Yes: External: 3-5 steps; on right going up, on left going up, and bilateral but cannot reach both Has following equipment at home: walking stick of long distances.   PLOF: Independent, Independent with basic ADLs, and Independent with household mobility without device  PATIENT GOALS: Be able to feel comfortable and confident with walking.  OBJECTIVE:  Note: Objective measures were completed at Evaluation unless otherwise noted.  DIAGNOSTIC FINDINGS: none recent.   COGNITION: Overall cognitive status: Within functional limits for tasks assessed   SENSATION: Light touch: Impaired  hx of neuropathy in BLE. In the soles of feel and slightly on the top of the foot.   COORDINATION: Grossly WFL with formal testing.    POSTURE: rounded shoulders  LOWER EXTREMITY ROM:     Grossly WFL  LOWER EXTREMITY MMT:    MMT Right Eval Left Eval  Hip flexion 4 4  Hip extension    Hip abduction 4+ 4+  Hip adduction 5 5  Hip internal rotation    Hip external rotation    Knee flexion 4+ 4+  Knee extension 5 5  Ankle dorsiflexion 4+ 4+  Ankle plantarflexion    Ankle inversion    Ankle eversion    (Blank rows = not tested)  BED MOBILITY:  Not tested  TRANSFERS: Sit to stand: Complete Independence   Assistive device utilized: None     Stand to sit: Complete Independence  Assistive device utilized: None     Chair to chair: Complete Independence  Assistive device utilized: None       RAMP:  Not tested  CURB:  Not tested  STAIRS: Findings: Level of Assistance: SBA, Stair Negotiation Technique: Alternating Pattern  with Bilateral Rails, Number of Stairs: 4, Height of Stairs: 6   , and Comments:   GAIT: Findings: Gait Characteristics: decreased pelvic rotation, step through pattern, and wide BOS, Distance walked: >64ft, Assistive device utilized:None, Level of assistance: Complete Independence, and Comments:    FUNCTIONAL TESTS:  30 seconds chair stand test: 16 mild SOB  6 minute walk test:  to be completed.  10 meter walk test: 7.9 sec and 8.3  Mini-BESTest: 19 Functional gait assessment: 20     PATIENT SURVEYS:  ABC  scale 88%                                                                                                                              TREATMENT DATE: 07/25/2023   Heel raise on bolster 2 x 12 Toe raise on bolster 2 x 12   Obstacle course x 4 CCW and x 4 CW. Step over bolsters. Up/down aerobic step, tandem walk on airex beam, weave through 5 cones.   Side stepping on wide airex beam x 4 bil Side step across wide airex pad with squat to touch each cone x 5 on the floor performed x 1 bil   Forward step up 6inch step x 12 bil  Lateral stair ascent x 3 bil, 4 steps each   Gait with 4# AW x 546ft with min cues for improved toe of on each step initially.   Throughout session, instruction from PT for decreased speed to allow improved activation and use of ankle and hip stabilizers for improved COM control. No reports of dizziness throughout session. CGA for safety throughout except dynamic balance training on airex beam, requiring min assist from PT.   PATIENT EDUCATION: Education details: POC. HEP. Pt educated throughout session about proper posture and  technique with exercises. Improved exercise technique, movement at target joints, use of target muscles after min verbal, visual, tactile cues. Safety with use of UE support for HEP to improve safety and confidence of high level balance tasks.   Person educated: Patient Education method: Explanation, Demonstration, and Handouts Education comprehension: verbalized understanding and returned demonstration  HOME EXERCISE PROGRAM: Access Code: [email or web address] URL: [email or web address] Date: 07/10/2023 Prepared by: Massie Dollar  Exercises - Feet Together Balance at Counter Top Eyes Closed  - 1 x daily - 4 x weekly - 3 sets - 3 reps - 30 sec  hold - Standing Tandem Balance with Counter Support  - 1 x daily - 4 x weekly - 3 sets - 4 reps - 20 sec  hold - Standing Single Leg Stance with Counter Support  - 1 x daily - 4 x weekly - 3 sets - 4 reps - 10 sec  hold  GOALS: Goals reviewed with patient? Yes  SHORT TERM GOALS: Target date: 08/14/2023    Patient will be independent in home exercise program to improve strength/mobility for better functional independence with ADLs. Baseline: provided on 6/11 Goal status: INITIAL   LONG TERM GOALS: Target date: 10/02/2023    Patient will increase ABC score by equal to or greater than  6 points   to demonstrate statistically significant improvement in mobility and quality of life.  Baseline: 88% Goal status: INITIAL  2.  Patient (> 35 years old) will increase 30 sec sit to stand by 4 repetitions  indicating an increased LE strength and improved balance. Baseline: 16 Goal status: INITIAL  3.  Patient will increase FGA score to >22 points to demonstrate decreased fall risk during functional activities  Baseline: 20 Goal status: INITIAL  4.  Patient will increase MiniBest test  10 meter walk test to >1.22m/s as to improve gait speed for better community ambulation and to reduce fall risk.  Baseline: 19 Goal status: INITIAL    5.   Patient will increase 6 min walk test by >112ft  to demonstrate reduced fall risk and improved dynamic gait balance for better safety with community/home ambulation.   Baseline: 1696 feet Goal status: INITIAL   ASSESSMENT:  CLINICAL IMPRESSION: Patient is an 89 y.o. Female who was seen today for physical therapy treatment for balance deficits and neuropathy. PT treatment continues to focus on improved dynamic balance with emphasis on improved righting reactions on unlevel surface and decrease speed of movement to allow proper activation of ankle and hip musculature.  Difficulty with tandem and side stepping on airex beam, will benefit from continued use.   Pt will benefit from skilled PT to address balance deficits and determine affects of Vertigo on unsteady feeling with ambulation; to  allow improved safety and improved overall QoL.   OBJECTIVE IMPAIRMENTS: Abnormal gait, cardiopulmonary status limiting activity, decreased activity tolerance, decreased balance, decreased coordination, decreased endurance, decreased knowledge of use of DME, decreased mobility, difficulty walking, decreased strength, hypomobility, and impaired flexibility.   ACTIVITY LIMITATIONS: carrying, lifting, bending, standing, squatting, stairs, transfers, and locomotion level  PARTICIPATION LIMITATIONS: cleaning, laundry, and community activity  PERSONAL FACTORS: Age and 1-2 comorbidities: chronic joint pain. HTN are also affecting patient's functional outcome.   REHAB POTENTIAL: Excellent  CLINICAL DECISION MAKING: Stable/uncomplicated  EVALUATION COMPLEXITY: Low  PLAN:  PT FREQUENCY: 1-2x/week  PT DURATION: 12 weeks  PLANNED INTERVENTIONS: 97164- PT Re-evaluation, 97750- Physical Performance Testing, 97110-Therapeutic exercises, 97530- Therapeutic activity, [id number]- Neuromuscular re-education, 97535- Self Care, [id number]- Manual therapy, [phone]- Gait training, [phone]- Electrical stimulation (unattended), [phone]-  Electrical stimulation (manual), Patient/Family education, Balance training, Stair training, Joint mobilization, Joint manipulation, Vestibular training, DME instructions, Cryotherapy, and Moist heat  PLAN FOR NEXT SESSION:   Continue high level dynamic balance forcing decreased speed of movement.  SLS and narrow BOS tasks.  Airex beam with blaze pods.    Massie FORBES Dollar, PT 07/25/2023, 4:23 PM

## 2023-07-30 ENCOUNTER — Ambulatory Visit: Attending: Internal Medicine | Admitting: Physical Therapy

## 2023-07-30 DIAGNOSIS — R269 Unspecified abnormalities of gait and mobility: Secondary | ICD-10-CM | POA: Diagnosis not present

## 2023-07-30 DIAGNOSIS — R262 Difficulty in walking, not elsewhere classified: Secondary | ICD-10-CM | POA: Insufficient documentation

## 2023-07-30 DIAGNOSIS — M25551 Pain in right hip: Secondary | ICD-10-CM | POA: Diagnosis not present

## 2023-07-30 DIAGNOSIS — R2689 Other abnormalities of gait and mobility: Secondary | ICD-10-CM | POA: Insufficient documentation

## 2023-07-30 NOTE — Therapy (Signed)
 OUTPATIENT PHYSICAL THERAPY NEURO Treatment    Patient Name: Laura [name] MRN: [id number] DOB:February 09, 1932, 89 y.o., female Today's Date: 07/30/2023   PCP:    Marylynn Verneita CROME, MD   REFERRING PROVIDER:    Marylynn Verneita CROME, MD    END OF SESSION:  PT End of Session - 07/30/23 1312     Visit Number 5    Number of Visits 24    Date for PT Re-Evaluation 10/02/23    PT Start Time 1318    PT Stop Time 1358    PT Time Calculation (min) 40 min    Equipment Utilized During Treatment Gait belt    Activity Tolerance Patient tolerated treatment well    Behavior During Therapy [facility] for tasks assessed/performed          Past Medical History:  Diagnosis Date   Accidental fall 10/16/2013   Anxiety    Carotid stenosis    Left    COVID-19 virus infection 09/14/2020   Fracture of rib 09/26/2019   High cholesterol    Hypertension    Pt states that her blood pressure is very well controlled   Migraine with aura    Neuropathy    Osteoporosis    Past Surgical History:  Procedure Laterality Date   ABDOMINAL HYSTERECTOMY     secondary to peritonitis and complications    APPENDECTOMY     CATARACT EXTRACTION W/PHACO Left 06/27/2022   Procedure: CATARACT EXTRACTION PHACO AND INTRAOCULAR LENS PLACEMENT (IOC) LEFT 6.36 00:43.1;  Surgeon: Mittie Gaskin, MD;  Location: [facility];  Service: Ophthalmology;  Laterality: Left;   CATARACT EXTRACTION W/PHACO Right 07/11/2022   Procedure: CATARACT EXTRACTION PHACO AND INTRAOCULAR LENS PLACEMENT (IOC) RIGHT 8.35 00:33.9;  Surgeon: Mittie Gaskin, MD;  Location: [facility];  Service: Ophthalmology;  Laterality: Right;   TONSILLECTOMY     TUBAL LIGATION     at age 34, vaginal complicated by peritonitis   Patient Active Problem List   Diagnosis Date Noted   Gallbladder mass 05/23/2023   Decreased GFR 01/13/2023   Vitamin D  deficiency 06/14/2022   Pain of right midfoot 06/11/2022   Right medial knee pain 06/11/2022    Pulsatile tinnitus 02/02/2022   Hemorrhoid 06/11/2021   History of rib fracture 01/07/2021   Cervical radiculopathy 01/07/2021   Aortic atherosclerosis (HCC) 05/02/2020   Dizziness 12/31/2018   Degenerative joint disease (DJD) of hip 11/29/2018   Bursitis of left hip 05/31/2018   Encounter for preventive health examination 07/31/2016   DDD (degenerative disc disease), lumbar 12/03/2014   Intracranial arachnoid cyst 10/25/2014   Low back pain 08/20/2014   Migraine aura without headache 04/24/2014   Encounter for Medicare annual wellness exam 11/01/2013   S/P hysterectomy with oophorectomy 01/03/2013   HTN (hypertension) 10/19/2012   B12 deficiency 04/16/2012   Generalized anxiety disorder 04/16/2012   Neuropathy of both feet 09/18/2011   Hyperlipidemia LDL goal <70 03/21/2011   Insomnia due to anxiety and fear 03/21/2011   Osteoporosis     ONSET DATE: `6 months.   REFERRING DIAG:  Diagnosis  R26.89 (ICD-10-CM) - Balance problem    THERAPY DIAG:  Abnormality of gait and mobility  Difficulty in walking, not elsewhere classified  Balance disorder  Rationale for Evaluation and Treatment: Rehabilitation  SUBJECTIVE:  SUBJECTIVE STATEMENT:   07/30/2023: Reports that she wen to Yoga this AM, and her legs are now tired. No pain.  States that she she had a good weekend.  Requesting to wear sandals in sessoin toady, as this is the shoes that she normally wears.    From Eval: Pt states that she has had BPPV in the past. Does not have dizzness with spinning sensation, but has difficulty with maintaining straight path when walking slow. Finds herself veering to the R and L.  Does Yoga regularly, but feels a little off balance compared to 4-5 years ago.  Does have hx of Migraines  with aura and  mild aphasia following that passes after a few minute. >4 years since migraine with aphasia.   Pt accompanied by: self  PERTINENT HISTORY:  Pt with hx of BPPV, Migraines, Back, pain, knee pain, DDD, and neuropathy presents to PT with increased balance deficits and difficulty with balance for long distance ambulation.  PAIN:  Are you having pain? No a little ache in the R knee. Does not rate.   PRECAUTIONS: Fall  RED FLAGS: None   WEIGHT BEARING RESTRICTIONS: No  FALLS: Has patient fallen in last 6 months? No  LIVING ENVIRONMENT: Lives with: lives alone daughter lives in town Lives in: House/apartment Stairs: Yes: External: 3-5 steps; on right going up, on left going up, and bilateral but cannot reach both Has following equipment at home: walking stick of long distances.   PLOF: Independent, Independent with basic ADLs, and Independent with household mobility without device  PATIENT GOALS: Be able to feel comfortable and confident with walking.  OBJECTIVE:  Note: Objective measures were completed at Evaluation unless otherwise noted.  DIAGNOSTIC FINDINGS: none recent.   COGNITION: Overall cognitive status: Within functional limits for tasks assessed   SENSATION: Light touch: Impaired  hx of neuropathy in BLE. In the soles of feel and slightly on the top of the foot.   COORDINATION: Grossly WFL with formal testing.    POSTURE: rounded shoulders  LOWER EXTREMITY ROM:     Grossly WFL  LOWER EXTREMITY MMT:    MMT Right Eval Left Eval  Hip flexion 4 4  Hip extension    Hip abduction 4+ 4+  Hip adduction 5 5  Hip internal rotation    Hip external rotation    Knee flexion 4+ 4+  Knee extension 5 5  Ankle dorsiflexion 4+ 4+  Ankle plantarflexion    Ankle inversion    Ankle eversion    (Blank rows = not tested)  BED MOBILITY:  Not tested  TRANSFERS: Sit to stand: Complete Independence  Assistive device utilized: None     Stand to sit: Complete  Independence  Assistive device utilized: None     Chair to chair: Complete Independence  Assistive device utilized: None       RAMP:  Not tested  CURB:  Not tested  STAIRS: Findings: Level of Assistance: SBA, Stair Negotiation Technique: Alternating Pattern  with Bilateral Rails, Number of Stairs: 4, Height of Stairs: 6   , and Comments:   GAIT: Findings: Gait Characteristics: decreased pelvic rotation, step through pattern, and wide BOS, Distance walked: >61ft, Assistive device utilized:None, Level of assistance: Complete Independence, and Comments:    FUNCTIONAL TESTS:  30 seconds chair stand test: 16 mild SOB  6 minute walk test:  to be completed.  10 meter walk test: 7.9 sec and 8.3  Mini-BESTest: 19 Functional gait assessment: 20     PATIENT  SURVEYS:  ABC scale 88%                                                                                                                              TREATMENT DATE: 07/30/2023  NMR: 1 foot on 6 inch step:  Static hold  2 x 20 sec. Bil   Head turn x 30 sec bil   Standing on airex pad: 1 foot on 6 inch step 2 x 30 sec  Feet together eyes open 2 x 45 sec; Feet together eyes closed 2 x 20 sec; min assist when eyes closed to reduce posterior bias.   Standing on airex beam:  Static hold: x 20 sec  Holding bball x 10 sec.  Diagonal raise with bball x 6 bil with min assist to prevent posterior bias.   Side stepping on airex beam. X 5 bil with no UE support CGA only.  Static stance on airex beam eyes closed 2 x 10  Gait without resistance x 147ft. No veering noted in straight path or turns.  Gait will ball toss in hall 2 x 122ft no LOB noted throughout.    CGA for safety throughout except dynamic balance training on airex beam, requiring intermittent min assist from PT to prevent posterior LOB. Cues for use of ankle strategy from PT to correct posterior and lateral LOB intermittently    PATIENT EDUCATION: Education details: POC.  HEP. Pt educated throughout session about proper posture and technique with exercises. Improved exercise technique, movement at target joints, use of target muscles after min verbal, visual, tactile cues. Safety with use of UE support for HEP to improve safety and confidence of high level balance tasks.   Person educated: Patient Education method: Explanation, Demonstration, and Handouts Education comprehension: verbalized understanding and returned demonstration  HOME EXERCISE PROGRAM: Access Code: [email or web address] URL: [email or web address] Date: 07/10/2023 Prepared by: Massie Dollar  Exercises - Feet Together Balance at Counter Top Eyes Closed  - 1 x daily - 4 x weekly - 3 sets - 3 reps - 30 sec  hold - Standing Tandem Balance with Counter Support  - 1 x daily - 4 x weekly - 3 sets - 4 reps - 20 sec  hold - Standing Single Leg Stance with Counter Support  - 1 x daily - 4 x weekly - 3 sets - 4 reps - 10 sec  hold  GOALS: Goals reviewed with patient? Yes  SHORT TERM GOALS: Target date: 08/14/2023    Patient will be independent in home exercise program to improve strength/mobility for better functional independence with ADLs. Baseline: provided on 6/11 Goal status: INITIAL   LONG TERM GOALS: Target date: 10/02/2023    Patient will increase ABC score by equal to or greater than  6 points   to demonstrate statistically significant improvement in mobility and quality of life.  Baseline: 88% Goal status: INITIAL  2.  Patient (> 59 years old) will increase 30 sec sit  to stand by 4 repetitions  indicating an increased LE strength and improved balance. Baseline: 16 Goal status: INITIAL  3.  Patient will increase FGA score to >22 points to demonstrate decreased fall risk during functional activities Baseline: 20 Goal status: INITIAL  4.  Patient will increase MiniBest test  10 meter walk test to >1.36m/s as to improve gait speed for better community ambulation and to reduce  fall risk.  Baseline: 19 Goal status: INITIAL    5.  Patient will increase 6 min walk test by >165ft  to demonstrate reduced fall risk and improved dynamic gait balance for better safety with community/home ambulation.   Baseline: 1696 feet Goal status: INITIAL   ASSESSMENT:  CLINICAL IMPRESSION: Patient is an 89 y.o. Female who was seen today for physical therapy treatment for balance deficits and neuropathy. PT treatment focused on improved righting reactions and use of hip strategy to prevent lateral and posterior LOB on unlevel surface and reduced visual input. Able to improve COM control with increased repetitions on unlevel surface. No overt dizziness within PT session on this day and on veering in gait.  Pt will benefit from skilled PT to address balance deficits and determine affects of Vertigo on unsteady feeling with ambulation; to  allow improved safety and improved overall QoL.   OBJECTIVE IMPAIRMENTS: Abnormal gait, cardiopulmonary status limiting activity, decreased activity tolerance, decreased balance, decreased coordination, decreased endurance, decreased knowledge of use of DME, decreased mobility, difficulty walking, decreased strength, hypomobility, and impaired flexibility.   ACTIVITY LIMITATIONS: carrying, lifting, bending, standing, squatting, stairs, transfers, and locomotion level  PARTICIPATION LIMITATIONS: cleaning, laundry, and community activity  PERSONAL FACTORS: Age and 1-2 comorbidities: chronic joint pain. HTN are also affecting patient's functional outcome.   REHAB POTENTIAL: Excellent  CLINICAL DECISION MAKING: Stable/uncomplicated  EVALUATION COMPLEXITY: Low  PLAN:  PT FREQUENCY: 1-2x/week  PT DURATION: 12 weeks  PLANNED INTERVENTIONS: 97164- PT Re-evaluation, 97750- Physical Performance Testing, 97110-Therapeutic exercises, 97530- Therapeutic activity, [id number]- Neuromuscular re-education, 97535- Self Care, 02859- Manual therapy, [phone]- Gait  training, [phone]- Electrical stimulation (unattended), [phone]- Electrical stimulation (manual), Patient/Family education, Balance training, Stair training, Joint mobilization, Joint manipulation, Vestibular training, DME instructions, Cryotherapy, and Moist heat  PLAN FOR NEXT SESSION:   Continue high level dynamic balance forcing decreased speed of movement.  SLS and narrow BOS tasks.  Airex beam with blaze pods.    Massie FORBES Dollar, PT 07/30/2023, 1:13 PM

## 2023-07-31 ENCOUNTER — Other Ambulatory Visit: Payer: Self-pay | Admitting: Internal Medicine

## 2023-07-31 DIAGNOSIS — I10 Essential (primary) hypertension: Secondary | ICD-10-CM

## 2023-08-01 ENCOUNTER — Ambulatory Visit: Admitting: Physical Therapy

## 2023-08-01 DIAGNOSIS — R269 Unspecified abnormalities of gait and mobility: Secondary | ICD-10-CM | POA: Diagnosis not present

## 2023-08-01 DIAGNOSIS — R2689 Other abnormalities of gait and mobility: Secondary | ICD-10-CM | POA: Diagnosis not present

## 2023-08-01 DIAGNOSIS — M25551 Pain in right hip: Secondary | ICD-10-CM | POA: Diagnosis not present

## 2023-08-01 DIAGNOSIS — R262 Difficulty in walking, not elsewhere classified: Secondary | ICD-10-CM

## 2023-08-01 NOTE — Therapy (Signed)
 OUTPATIENT PHYSICAL THERAPY NEURO Treatment    Patient Name: Laura [name] MRN: [id number] DOB:11-09-1932, 89 y.o., female Today's Date: 08/01/2023   PCP:    Marylynn Verneita CROME, MD   REFERRING PROVIDER:    Marylynn Verneita CROME, MD    END OF SESSION:  PT End of Session - 08/01/23 1452     Visit Number 6    Number of Visits 24    Date for PT Re-Evaluation 10/02/23    Progress Note Due on Visit 10    PT Start Time 1449    PT Stop Time 1530    PT Time Calculation (min) 41 min    Equipment Utilized During Treatment Gait belt    Activity Tolerance Patient tolerated treatment well    Behavior During Therapy [facility] for tasks assessed/performed          Past Medical History:  Diagnosis Date   Accidental fall 10/16/2013   Anxiety    Carotid stenosis    Left    COVID-19 virus infection 09/14/2020   Fracture of rib 09/26/2019   High cholesterol    Hypertension    Pt states that her blood pressure is very well controlled   Migraine with aura    Neuropathy    Osteoporosis    Past Surgical History:  Procedure Laterality Date   ABDOMINAL HYSTERECTOMY     secondary to peritonitis and complications    APPENDECTOMY     CATARACT EXTRACTION W/PHACO Left 06/27/2022   Procedure: CATARACT EXTRACTION PHACO AND INTRAOCULAR LENS PLACEMENT (IOC) LEFT 6.36 00:43.1;  Surgeon: Mittie Gaskin, MD;  Location: [facility];  Service: Ophthalmology;  Laterality: Left;   CATARACT EXTRACTION W/PHACO Right 07/11/2022   Procedure: CATARACT EXTRACTION PHACO AND INTRAOCULAR LENS PLACEMENT (IOC) RIGHT 8.35 00:33.9;  Surgeon: Mittie Gaskin, MD;  Location: [facility];  Service: Ophthalmology;  Laterality: Right;   TONSILLECTOMY     TUBAL LIGATION     at age 35, vaginal complicated by peritonitis   Patient Active Problem List   Diagnosis Date Noted   Gallbladder mass 05/23/2023   Decreased GFR 01/13/2023   Vitamin D  deficiency 06/14/2022   Pain of right midfoot 06/11/2022    Right medial knee pain 06/11/2022   Pulsatile tinnitus 02/02/2022   Hemorrhoid 06/11/2021   History of rib fracture 01/07/2021   Cervical radiculopathy 01/07/2021   Aortic atherosclerosis (HCC) 05/02/2020   Dizziness 12/31/2018   Degenerative joint disease (DJD) of hip 11/29/2018   Bursitis of left hip 05/31/2018   Encounter for preventive health examination 07/31/2016   DDD (degenerative disc disease), lumbar 12/03/2014   Intracranial arachnoid cyst 10/25/2014   Low back pain 08/20/2014   Migraine aura without headache 04/24/2014   Encounter for Medicare annual wellness exam 11/01/2013   S/P hysterectomy with oophorectomy 01/03/2013   HTN (hypertension) 10/19/2012   B12 deficiency 04/16/2012   Generalized anxiety disorder 04/16/2012   Neuropathy of both feet 09/18/2011   Hyperlipidemia LDL goal <70 03/21/2011   Insomnia due to anxiety and fear 03/21/2011   Osteoporosis     ONSET DATE: `6 months.   REFERRING DIAG:  Diagnosis  R26.89 (ICD-10-CM) - Balance problem    THERAPY DIAG:  Abnormality of gait and mobility  Difficulty in walking, not elsewhere classified  Balance disorder  Rationale for Evaluation and Treatment: Rehabilitation  SUBJECTIVE:  SUBJECTIVE STATEMENT:   08/01/2023: Patient reports no falls or loss of balance since last session.  Patient reports she has been doing well with no new updates to report at this time.  She is going out of town between now and her next session.  From Eval: Pt states that she has had BPPV in the past. Does not have dizzness with spinning sensation, but has difficulty with maintaining straight path when walking slow. Finds herself veering to the R and L.  Does Yoga regularly, but feels a little off balance compared to 4-5 years ago.  Does have  hx of Migraines  with aura and mild aphasia following that passes after a few minute. >4 years since migraine with aphasia.   Pt accompanied by: self  PERTINENT HISTORY:  Pt with hx of BPPV, Migraines, Back, pain, knee pain, DDD, and neuropathy presents to PT with increased balance deficits and difficulty with balance for long distance ambulation.  PAIN:  Are you having pain? No a little ache in the R knee. Does not rate.   PRECAUTIONS: Fall  RED FLAGS: None   WEIGHT BEARING RESTRICTIONS: No  FALLS: Has patient fallen in last 6 months? No  LIVING ENVIRONMENT: Lives with: lives alone daughter lives in town Lives in: House/apartment Stairs: Yes: External: 3-5 steps; on right going up, on left going up, and bilateral but cannot reach both Has following equipment at home: walking stick of long distances.   PLOF: Independent, Independent with basic ADLs, and Independent with household mobility without device  PATIENT GOALS: Be able to feel comfortable and confident with walking.  OBJECTIVE:  Note: Objective measures were completed at Evaluation unless otherwise noted.  DIAGNOSTIC FINDINGS: none recent.   COGNITION: Overall cognitive status: Within functional limits for tasks assessed   SENSATION: Light touch: Impaired  hx of neuropathy in BLE. In the soles of feel and slightly on the top of the foot.   COORDINATION: Grossly WFL with formal testing.    POSTURE: rounded shoulders  LOWER EXTREMITY ROM:     Grossly WFL  LOWER EXTREMITY MMT:    MMT Right Eval Left Eval  Hip flexion 4 4  Hip extension    Hip abduction 4+ 4+  Hip adduction 5 5  Hip internal rotation    Hip external rotation    Knee flexion 4+ 4+  Knee extension 5 5  Ankle dorsiflexion 4+ 4+  Ankle plantarflexion    Ankle inversion    Ankle eversion    (Blank rows = not tested)  BED MOBILITY:  Not tested  TRANSFERS: Sit to stand: Complete Independence  Assistive device utilized: None      Stand to sit: Complete Independence  Assistive device utilized: None     Chair to chair: Complete Independence  Assistive device utilized: None       RAMP:  Not tested  CURB:  Not tested  STAIRS: Findings: Level of Assistance: SBA, Stair Negotiation Technique: Alternating Pattern  with Bilateral Rails, Number of Stairs: 4, Height of Stairs: 6   , and Comments:   GAIT: Findings: Gait Characteristics: decreased pelvic rotation, step through pattern, and wide BOS, Distance walked: >7ft, Assistive device utilized:None, Level of assistance: Complete Independence, and Comments:    FUNCTIONAL TESTS:  30 seconds chair stand test: 16 mild SOB  6 minute walk test:  to be completed.  10 meter walk test: 7.9 sec and 8.3  Mini-BESTest: 19 Functional gait assessment: 20     PATIENT SURVEYS:  ABC  scale 88%                                                                                                                              TREATMENT DATE: 08/01/2023  NMR: To facilitate reeducation of movement, balance, posture, coordination, and/or proprioception/kinesthetic sense.   On airex pad with adducted stance -Static hold  2 x 20 sec. Bil   -Head turn x 30 sec lateral and x 30 seconds vertical head turns.  Patient instructed to perform head turns slowly.  Step on / off airex pad 2 x 10 ea laterally stepping - When patient speaking patient has higher tendency of feet catching on Airex pad when stepping on and off with this activity.  Standing on airex pad:  1 foot on airex - other on step 2 x 30 sec Step taps form airex 2 x 10  -cues for foot clearance on eccentric portion of movement   Walk across red mat with AW under it to provide uneven surface x 10 laps at slow pace, x 10 laps at faster/ normal walking pace, no overt LOB and demonstrated improvement with practice   Stance on incline board with ball toss perturbation x 2-3 min - Working on anterior weight shift and prevention of  posterior loss of balance  Stance on foam incline ( wedge) with ball toss perturbation 2-3 min, a few post LOB but improvement with practice    CGA for safety throughout except dynamic balance training on airex beam, requiring intermittent min assist from PT to prevent posterior LOB. Cues for use of ankle strategy from PT to correct posterior and lateral LOB intermittently    PATIENT EDUCATION: Education details: POC. HEP. Pt educated throughout session about proper posture and technique with exercises. Improved exercise technique, movement at target joints, use of target muscles after min verbal, visual, tactile cues. Safety with use of UE support for HEP to improve safety and confidence of high level balance tasks.   Person educated: Patient Education method: Explanation, Demonstration, and Handouts Education comprehension: verbalized understanding and returned demonstration  HOME EXERCISE PROGRAM: Access Code: [email or web address] URL: [email or web address] Date: 07/10/2023 Prepared by: Massie Dollar  Exercises - Feet Together Balance at Counter Top Eyes Closed  - 1 x daily - 4 x weekly - 3 sets - 3 reps - 30 sec  hold - Standing Tandem Balance with Counter Support  - 1 x daily - 4 x weekly - 3 sets - 4 reps - 20 sec  hold - Standing Single Leg Stance with Counter Support  - 1 x daily - 4 x weekly - 3 sets - 4 reps - 10 sec  hold  GOALS: Goals reviewed with patient? Yes  SHORT TERM GOALS: Target date: 08/14/2023    Patient will be independent in home exercise program to improve strength/mobility for better functional independence with ADLs. Baseline: provided on 6/11 Goal status: INITIAL   LONG TERM GOALS: Target date: 10/02/2023  Patient will increase ABC score by equal to or greater than  6 points   to demonstrate statistically significant improvement in mobility and quality of life.  Baseline: 88% Goal status: INITIAL  2.  Patient (> 58 years old) will increase  30 sec sit to stand by 4 repetitions  indicating an increased LE strength and improved balance. Baseline: 16 Goal status: INITIAL  3.  Patient will increase FGA score to >22 points to demonstrate decreased fall risk during functional activities Baseline: 20 Goal status: INITIAL  4.  Patient will increase MiniBest test  10 meter walk test to >1.72m/s as to improve gait speed for better community ambulation and to reduce fall risk.  Baseline: 19 Goal status: INITIAL    5.  Patient will increase 6 min walk test by >123ft  to demonstrate reduced fall risk and improved dynamic gait balance for better safety with community/home ambulation.   Baseline: 1696 feet Goal status: INITIAL   ASSESSMENT:  CLINICAL IMPRESSION:  Patient is an 89 y.o. Female who was seen today for physical therapy treatment for balance deficits and neuropathy. PT treatment focused on improved righting reactions and use of hip strategy and available ankle strategies to prevent lateral and posterior LOB on unlevel surface and reduced visual input.  Patient also challenged with ambulating on red mat in order to stimulate cobblestone and uneven pathways at a higher difficulty level via various ankle weights placed under the mat.  Pt will benefit from skilled PT to address balance deficits and determine affects of Vertigo on unsteady feeling with ambulation; to  allow improved safety and improved overall QoL.   OBJECTIVE IMPAIRMENTS: Abnormal gait, cardiopulmonary status limiting activity, decreased activity tolerance, decreased balance, decreased coordination, decreased endurance, decreased knowledge of use of DME, decreased mobility, difficulty walking, decreased strength, hypomobility, and impaired flexibility.   ACTIVITY LIMITATIONS: carrying, lifting, bending, standing, squatting, stairs, transfers, and locomotion level  PARTICIPATION LIMITATIONS: cleaning, laundry, and community activity  PERSONAL FACTORS: Age and 1-2  comorbidities: chronic joint pain. HTN are also affecting patient's functional outcome.   REHAB POTENTIAL: Excellent  CLINICAL DECISION MAKING: Stable/uncomplicated  EVALUATION COMPLEXITY: Low  PLAN:  PT FREQUENCY: 1-2x/week  PT DURATION: 12 weeks  PLANNED INTERVENTIONS: 97164- PT Re-evaluation, 97750- Physical Performance Testing, 97110-Therapeutic exercises, 97530- Therapeutic activity, [id number]- Neuromuscular re-education, 97535- Self Care, [id number]- Manual therapy, [phone]- Gait training, [phone]- Electrical stimulation (unattended), [phone]- Electrical stimulation (manual), Patient/Family education, Balance training, Stair training, Joint mobilization, Joint manipulation, Vestibular training, DME instructions, Cryotherapy, and Moist heat  PLAN FOR NEXT SESSION:  Continue high level dynamic balance forcing decreased speed of movement.  SLS and narrow BOS tasks.  Airex beam with blaze pods.   Lonni KATHEE Gainer, PT 08/01/2023, 4:47 PM

## 2023-08-05 ENCOUNTER — Ambulatory Visit: Admitting: Physical Therapy

## 2023-08-06 ENCOUNTER — Ambulatory Visit: Admitting: Physical Therapy

## 2023-08-08 ENCOUNTER — Ambulatory Visit: Admitting: Physical Therapy

## 2023-08-13 ENCOUNTER — Ambulatory Visit: Admitting: Physical Therapy

## 2023-08-15 ENCOUNTER — Ambulatory Visit: Admitting: Physical Therapy

## 2023-08-20 ENCOUNTER — Ambulatory Visit: Admitting: Physical Therapy

## 2023-08-20 DIAGNOSIS — M25551 Pain in right hip: Secondary | ICD-10-CM | POA: Diagnosis not present

## 2023-08-20 DIAGNOSIS — R2689 Other abnormalities of gait and mobility: Secondary | ICD-10-CM

## 2023-08-20 DIAGNOSIS — R269 Unspecified abnormalities of gait and mobility: Secondary | ICD-10-CM

## 2023-08-20 DIAGNOSIS — R262 Difficulty in walking, not elsewhere classified: Secondary | ICD-10-CM | POA: Diagnosis not present

## 2023-08-20 NOTE — Therapy (Unsigned)
 OUTPATIENT PHYSICAL THERAPY NEURO Treatment /  Discharge Assessment   Patient Name: Laura [name] MRN: [id number] DOB:[birth year], 89 y.o., female Today's Date: 08/20/2023   PCP:    Marylynn Verneita CROME, MD   REFERRING PROVIDER:    Marylynn Verneita CROME, MD    END OF SESSION:  PT End of Session - 08/20/23 1535     Visit Number 7    Number of Visits 24    Date for PT Re-Evaluation 10/02/23    Progress Note Due on Visit 10    PT Start Time 1535    PT Stop Time 1615    PT Time Calculation (min) 40 min    Equipment Utilized During Treatment Gait belt    Activity Tolerance Patient tolerated treatment well    Behavior During Therapy [facility] for tasks assessed/performed          Past Medical History:  Diagnosis Date   Accidental fall 10/16/2013   Anxiety    Carotid stenosis    Left    COVID-19 virus infection 09/14/2020   Fracture of rib 09/26/2019   High cholesterol    Hypertension    Pt states that her blood pressure is very well controlled   Migraine with aura    Neuropathy    Osteoporosis    Past Surgical History:  Procedure Laterality Date   ABDOMINAL HYSTERECTOMY     secondary to peritonitis and complications    APPENDECTOMY     CATARACT EXTRACTION W/PHACO Left 06/27/2022   Procedure: CATARACT EXTRACTION PHACO AND INTRAOCULAR LENS PLACEMENT (IOC) LEFT 6.36 00:43.1;  Surgeon: Mittie Gaskin, MD;  Location: [facility];  Service: Ophthalmology;  Laterality: Left;   CATARACT EXTRACTION W/PHACO Right 07/11/2022   Procedure: CATARACT EXTRACTION PHACO AND INTRAOCULAR LENS PLACEMENT (IOC) RIGHT 8.35 00:33.9;  Surgeon: Mittie Gaskin, MD;  Location: [facility];  Service: Ophthalmology;  Laterality: Right;   TONSILLECTOMY     TUBAL LIGATION     at age 23, vaginal complicated by peritonitis   Patient Active Problem List   Diagnosis Date Noted   Gallbladder mass 05/23/2023   Decreased GFR 01/13/2023   Vitamin D  deficiency 06/14/2022   Pain of  right midfoot 06/11/2022   Right medial knee pain 06/11/2022   Pulsatile tinnitus 02/02/2022   Hemorrhoid 06/11/2021   History of rib fracture 01/07/2021   Cervical radiculopathy 01/07/2021   Aortic atherosclerosis (HCC) 05/02/2020   Dizziness 12/31/2018   Degenerative joint disease (DJD) of hip 11/29/2018   Bursitis of left hip 05/31/2018   Encounter for preventive health examination 07/31/2016   DDD (degenerative disc disease), lumbar 12/03/2014   Intracranial arachnoid cyst 10/25/2014   Low back pain 08/20/2014   Migraine aura without headache 04/24/2014   Encounter for Medicare annual wellness exam 11/01/2013   S/P hysterectomy with oophorectomy 01/03/2013   HTN (hypertension) 10/19/2012   B12 deficiency 04/16/2012   Generalized anxiety disorder 04/16/2012   Neuropathy of both feet 09/18/2011   Hyperlipidemia LDL goal <70 03/21/2011   Insomnia due to anxiety and fear 03/21/2011   Osteoporosis     ONSET DATE: `6 months.   REFERRING DIAG:  Diagnosis  R26.89 (ICD-10-CM) - Balance problem    THERAPY DIAG:  Abnormality of gait and mobility  Pain in right hip  Difficulty in walking, not elsewhere classified  Balance disorder  Rationale for Evaluation and Treatment: Rehabilitation  SUBJECTIVE:  SUBJECTIVE STATEMENT:   08/20/2023:  Pt reports that she is doing well. Is returning following family vacation to [place] in [place] . Had a wonderful time. States that she was able to go on multiple hikes for >2 miles every day and was able to dance and spin without significant vertigo s/s.   From Eval: Pt states that she has had BPPV in the past. Does not have dizzness with spinning sensation, but has difficulty with maintaining straight path when walking slow. Finds herself veering to the R and  L.  Does Yoga regularly, but feels a little off balance compared to 4-5 years ago.  Does have hx of Migraines  with aura and mild aphasia following that passes after a few minute. >4 years since migraine with aphasia.   Pt accompanied by: self  PERTINENT HISTORY:  Pt with hx of BPPV, Migraines, Back, pain, knee pain, DDD, and neuropathy presents to PT with increased balance deficits and difficulty with balance for long distance ambulation.  PAIN:  Are you having pain? No a little ache in the R knee. Does not rate.   PRECAUTIONS: Fall  RED FLAGS: None   WEIGHT BEARING RESTRICTIONS: No  FALLS: Has patient fallen in last 6 months? No  LIVING ENVIRONMENT: Lives with: lives alone daughter lives in town Lives in: House/apartment Stairs: Yes: External: 3-5 steps; on right going up, on left going up, and bilateral but cannot reach both Has following equipment at home: walking stick of long distances.   PLOF: Independent, Independent with basic ADLs, and Independent with household mobility without device  PATIENT GOALS: Be able to feel comfortable and confident with walking.  OBJECTIVE:  Note: Objective measures were completed at Evaluation unless otherwise noted.  DIAGNOSTIC FINDINGS: none recent.   COGNITION: Overall cognitive status: Within functional limits for tasks assessed   SENSATION: Light touch: Impaired  hx of neuropathy in BLE. In the soles of feel and slightly on the top of the foot.   COORDINATION: Grossly WFL with formal testing.    POSTURE: rounded shoulders  LOWER EXTREMITY ROM:     Grossly WFL  LOWER EXTREMITY MMT:    MMT Right Eval Left Eval  Hip flexion 4 4  Hip extension    Hip abduction 4+ 4+  Hip adduction 5 5  Hip internal rotation    Hip external rotation    Knee flexion 4+ 4+  Knee extension 5 5  Ankle dorsiflexion 4+ 4+  Ankle plantarflexion    Ankle inversion    Ankle eversion    (Blank rows = not tested)  BED MOBILITY:  Not  tested  TRANSFERS: Sit to stand: Complete Independence  Assistive device utilized: None     Stand to sit: Complete Independence  Assistive device utilized: None     Chair to chair: Complete Independence  Assistive device utilized: None       RAMP:  Not tested  CURB:  Not tested  STAIRS: Findings: Level of Assistance: SBA, Stair Negotiation Technique: Alternating Pattern  with Bilateral Rails, Number of Stairs: 4, Height of Stairs: 6   , and Comments:   GAIT: Findings: Gait Characteristics: decreased pelvic rotation, step through pattern, and wide BOS, Distance walked: >24ft, Assistive device utilized:None, Level of assistance: Complete Independence, and Comments:    FUNCTIONAL TESTS:  See GOALS    PATIENT SURVEYS:  ABC scale 88%  TREATMENT DATE: 08/20/2023    [facility] PT Assessment - 08/20/23 0001       Mini-BESTest   Sit To Stand Normal: Comes to stand without use of hands and stabilizes independently.    Rise to Toes Normal: Stable for 3 s with maximum height.    Stand on one leg (left) Moderate: < 20 s    Stand on one leg (right) Moderate: < 20 s    Stand on one leg - lowest score 1    Compensatory Stepping Correction - Forward Normal: Recovers independently with a single, large step (second realignement is allowed).    Compensatory Stepping Correction - Backward Moderate: More than one step is required to recover equilibrium    Compensatory Stepping Correction - Left Lateral Moderate: Several steps to recover equilibrium    Compensatory Stepping Correction - Right Lateral Moderate: Several steps to recover equilibrium    Stepping Corredtion Lateral - lowest score 1    Stance - Feet together, eyes open, firm surface  Normal: 30s    Stance - Feet together, eyes closed, foam surface  Moderate: < 30s    Incline - Eyes Closed Moderate: Stands independently  < 30s OR aligns with surface    Change in Gait Speed Normal: Significantly changes walkling speed without imbalance    Walk with head turns - Horizontal Normal: performs head turns with no change in gait speed and good balance    Walk with pivot turns Normal: Turns with feet close FAST (< 3 steps) with good balance.    Step over obstacles Normal: Able to step over box with minimal change of gait speed and with good balance.    Timed UP & GO with Dual Task Moderate: Dual Task affects either counting OR walking (>10%) when compared to the TUG without Dual Task.    Mini-BEST total score 22      Functional Gait  Assessment   Gait Level Surface Walks 20 ft in less than 5.5 sec, no assistive devices, good speed, no evidence for imbalance, normal gait pattern, deviates no more than 6 in outside of the 12 in walkway width.    Change in Gait Speed Able to smoothly change walking speed without loss of balance or gait deviation. Deviate no more than 6 in outside of the 12 in walkway width.    Gait with Horizontal Head Turns Performs head turns smoothly with no change in gait. Deviates no more than 6 in outside 12 in walkway width    Gait with Vertical Head Turns Performs head turns with no change in gait. Deviates no more than 6 in outside 12 in walkway width.    Gait and Pivot Turn Pivot turns safely in greater than 3 sec and stops with no loss of balance, or pivot turns safely within 3 sec and stops with mild imbalance, requires small steps to catch balance.    Step Over Obstacle Is able to step over 2 stacked shoe boxes taped together (9 in total height) without changing gait speed. No evidence of imbalance.    Gait with Narrow Base of Support Ambulates 4-7 steps.    Gait with Eyes Closed Walks 20 ft, uses assistive device, slower speed, mild gait deviations, deviates 6-10 in outside 12 in walkway width. Ambulates 20 ft in less than 9 sec but greater than 7 sec.    Ambulating Backwards Walks 20 ft, no  assistive devices, good speed, no evidence for imbalance, normal gait    Steps Alternating feet,  no rail.    Total Score 26         6 Min Walk Test:  Instructed patient to ambulate as quickly and as safely as possible for 6 minutes using LRAD. Patient was allowed to take standing rest breaks without stopping the test, but if the patient required a sitting rest break the clock would be stopped and the test would be over.  Results: 1735 ft  Results indicate that the patient has reduced endurance with ambulation compared to age matched norms.  Age Matched Norms: 58-69 yo M: 67 F: 70, 26-79 yo M: 24 F: 471, 32-89 yo M: 417 F: 392 MDC: 58.21 meters (190.98 feet) or 50 meters (ANPTA Core Set of Outcome Measures for Adults with Neurologic Conditions, 2018)  CGA for safety throughout except dynamic balance training on airex beam, requiring intermittent min assist from PT to prevent posterior LOB. Cues for use of ankle strategy from PT to correct posterior and lateral LOB intermittently    PATIENT EDUCATION: Education details: POC. HEP. Pt educated throughout session about proper posture and technique with exercises. Improved exercise technique, movement at target joints, use of target muscles after min verbal, visual, tactile cues. Safety with use of UE support for HEP to improve safety and confidence of high level balance tasks.   Person educated: Patient Education method: Explanation, Demonstration, and Handouts Education comprehension: verbalized understanding and returned demonstration  HOME EXERCISE PROGRAM: Access Code: [email or web address] URL: [email or web address] Date: 07/10/2023 Prepared by: Massie Dollar  Exercises - Feet Together Balance at Counter Top Eyes Closed  - 1 x daily - 4 x weekly - 3 sets - 3 reps - 30 sec  hold - Standing Tandem Balance with Counter Support  - 1 x daily - 4 x weekly - 3 sets - 4 reps - 20 sec  hold - Standing Single Leg Stance with Counter  Support  - 1 x daily - 4 x weekly - 3 sets - 4 reps - 10 sec  hold  GOALS: Goals reviewed with patient? Yes  SHORT TERM GOALS: Target date: 08/14/2023    Patient will be independent in home exercise program to improve strength/mobility for better functional independence with ADLs. Baseline: provided on 6/11 Goal status: MET   LONG TERM GOALS: Target date: 10/02/2023    Patient will increase ABC score by equal to or greater than  6 points   to demonstrate statistically significant improvement in mobility and quality of life.  Baseline: 88% 7/22: 94% Goal status: MET  2.  Patient (> 64 years old) will increase 30 sec sit to stand by 4 repetitions  indicating an increased LE strength and improved balance. Baseline: 16 7/22: 17 Goal status: IN PROGRESS  3.  Patient will increase FGA score to >22 points to demonstrate decreased fall risk during functional activities Baseline: 20 7/22: 26:  Goal status: MET  4.  Patient will increase MiniBest >20 as to improve gait speed for better community ambulation and to reduce fall risk.  Baseline: 19 7/22: 22 Goal status: MET  5.  Patient will increase 6 min walk test by >149ft  to demonstrate reduced fall risk and improved dynamic gait balance for better safety with community/home ambulation.   Baseline: 1696 ft    7/22: 1735 ft Goal status: IN PROGRESS   ASSESSMENT:  CLINICAL IMPRESSION:  Patient is an 89 y.o. Female who was seen today for physical therapy treatment for balance deficits and neuropathy. Pt is returning PT following family vacation. States  that her balance has improved with reduced dizziness overall; but still has occasional lateral LOB veering. PT re-assessed Goals has pt reports that she is ready to transition to home management of balance balance. Pt has met 3 of 5 LTGs. With FGA and Mini-BEST indicating low fall risk and improved 6 min walk test and 30 sec sit<>stand. Due to improved balance, safety with mobility,  and reduced vertigo, pt will no longer require skilled to PT to address deficits.      OBJECTIVE IMPAIRMENTS: Abnormal gait, cardiopulmonary status limiting activity, decreased activity tolerance, decreased balance, decreased coordination, decreased endurance, decreased knowledge of use of DME, decreased mobility, difficulty walking, decreased strength, hypomobility, and impaired flexibility.   ACTIVITY LIMITATIONS: carrying, lifting, bending, standing, squatting, stairs, transfers, and locomotion level  PARTICIPATION LIMITATIONS: cleaning, laundry, and community activity  PERSONAL FACTORS: Age and 1-2 comorbidities: chronic joint pain. HTN are also affecting patient's functional outcome.   REHAB POTENTIAL: Excellent  CLINICAL DECISION MAKING: Stable/uncomplicated  EVALUATION COMPLEXITY: Low  PLAN:  PT FREQUENCY: 1-2x/week  PT DURATION: 12 weeks  PLANNED INTERVENTIONS: 97164- PT Re-evaluation, 97750- Physical Performance Testing, 97110-Therapeutic exercises, 97530- Therapeutic activity, 97112- Neuromuscular re-education, 97535- Self Care, [id number]- Manual therapy, [phone]- Gait training, [phone]- Electrical stimulation (unattended), [phone]- Electrical stimulation (manual), Patient/Family education, Balance training, Stair training, Joint mobilization, Joint manipulation, Vestibular training, DME instructions, Cryotherapy, and Moist heat  PLAN FOR NEXT SESSION:    N/a    Massie FORBES Dollar, PT 08/20/2023, 3:35 PM

## 2023-08-22 ENCOUNTER — Ambulatory Visit: Admitting: Physical Therapy

## 2023-08-27 ENCOUNTER — Ambulatory Visit: Admitting: Physical Therapy

## 2023-08-28 ENCOUNTER — Ambulatory Visit: Admitting: Internal Medicine

## 2023-08-28 ENCOUNTER — Encounter: Payer: Self-pay | Admitting: Internal Medicine

## 2023-08-28 VITALS — BP 114/66 | HR 81 | Ht 63.0 in | Wt 151.6 lb

## 2023-08-28 DIAGNOSIS — R5383 Other fatigue: Secondary | ICD-10-CM | POA: Diagnosis not present

## 2023-08-28 DIAGNOSIS — E559 Vitamin D deficiency, unspecified: Secondary | ICD-10-CM

## 2023-08-28 DIAGNOSIS — I10 Essential (primary) hypertension: Secondary | ICD-10-CM | POA: Diagnosis not present

## 2023-08-28 DIAGNOSIS — F5105 Insomnia due to other mental disorder: Secondary | ICD-10-CM

## 2023-08-28 DIAGNOSIS — R110 Nausea: Secondary | ICD-10-CM | POA: Insufficient documentation

## 2023-08-28 DIAGNOSIS — M25561 Pain in right knee: Secondary | ICD-10-CM

## 2023-08-28 DIAGNOSIS — M810 Age-related osteoporosis without current pathological fracture: Secondary | ICD-10-CM

## 2023-08-28 DIAGNOSIS — R7301 Impaired fasting glucose: Secondary | ICD-10-CM | POA: Diagnosis not present

## 2023-08-28 DIAGNOSIS — R42 Dizziness and giddiness: Secondary | ICD-10-CM

## 2023-08-28 DIAGNOSIS — E785 Hyperlipidemia, unspecified: Secondary | ICD-10-CM

## 2023-08-28 DIAGNOSIS — G5793 Unspecified mononeuropathy of bilateral lower limbs: Secondary | ICD-10-CM

## 2023-08-28 DIAGNOSIS — F409 Phobic anxiety disorder, unspecified: Secondary | ICD-10-CM

## 2023-08-28 MED ORDER — ESCITALOPRAM OXALATE 10 MG PO TABS: ORAL_TABLET | ORAL | 1 refills | Status: AC

## 2023-08-28 MED ORDER — OMEPRAZOLE 20 MG PO CPDR: 20.0000 mg | DELAYED_RELEASE_CAPSULE | Freq: Every day | ORAL | Status: DC

## 2023-08-28 MED ORDER — TELMISARTAN 20 MG PO TABS: 20.0000 mg | ORAL_TABLET | Freq: Every day | ORAL | 1 refills | Status: AC

## 2023-08-28 NOTE — Progress Notes (Signed)
 Subjective:  Patient ID: Laura [name], female    DOB: 12-16-1932  Age: 89 y.o. MRN: [id number]  CC: The primary encounter diagnosis was Hyperlipidemia LDL goal <70. Diagnoses of Primary hypertension, Impaired fasting glucose, Fatigue, unspecified type, Age-related osteoporosis without current pathological fracture, Vitamin D  deficiency, Right medial knee pain, Nausea, Vertigo, Neuropathy of both feet, and Insomnia due to anxiety and fear were also pertinent to this visit.   HPI Taron Conrey Lurz presents for  Chief Complaint  Patient presents with   Medical Management of Chronic Issues    6 month follow up    1) gait unsteadiness: completed PT  last week.  felt that it helped but worried that her horizontal vertigo will return and worries how she will deal with it.   SABRA Has not had it since spring .  Legs still feel heavy   2) osteoprosis Last prolia  injection May 2025.  Last DEXA   3) HTN:  Patient is taking telmisartan  as prescribed and notes no adverse effects.  Home BP readings have been done about once per week and are  generally < 130/80 .  She is avoiding added salt in her diet and walking regularly about 3 times per week for exercise  .   4) abdomen feels uneasy all the time.  Denies pain ,  but feels mildly nauseated,   for the past month or so.     Outpatient Medications Prior to Visit  Medication Sig Dispense Refill   calcium  carbonate (OS-CAL) 600 MG TABS Take 600 mg by mouth 2 (two) times daily with a meal.     Cholecalciferol (VITAMIN D3) 50 MCG (2000 UT) CAPS Take 1 capsule by mouth daily.     Coenzyme Q10 (CO Q 10) 100 MG CAPS Take 1 capsule by mouth daily.     Cyanocobalamin  2500 MCG SUBL PLACE 1 TABLET UNDER THE TONGUE DAILY AS DIRECTED 90 tablet 1   denosumab  (PROLIA ) 60 MG/ML SOLN injection Inject 60 mg into the skin every 6 (six) months. Administer in upper arm, thigh, or abdomen     hydrocortisone 2.5 % cream Apply 1 Application topically.     magnesium oxide  (MAG-OX) 400 MG tablet Take 400 mg by mouth 2 (two) times daily.     Multiple Vitamins-Minerals (PRESERVISION AREDS 2 PO) Take 1 capsule by mouth daily.     nitroGLYCERIN  (NITROSTAT ) 0.4 MG SL tablet Place 1 tablet (0.4 mg total) under the tongue every 5 (five) minutes as needed for chest pain. 50 tablet 3   psyllium (METAMUCIL) 58.6 % packet Take 1 packet by mouth daily.     Riboflavin 100 MG CAPS Take 1 capsule by mouth 2 (two) times daily.      rosuvastatin  (CRESTOR ) 10 MG tablet TAKE 1 TABLET(10 MG) BY MOUTH DAILY 90 tablet 1   zolpidem  (AMBIEN ) 10 MG tablet TAKE 1 TABLET BY MOUTH AT BEDTIME 30 tablet 5   escitalopram  (LEXAPRO ) 10 MG tablet TAKE 1 TABLET(10 MG) BY MOUTH DAILY 90 tablet 1   omeprazole  (PRILOSEC) 20 MG capsule Take 1 capsule (20 mg total) by mouth daily. 30 capsule 3   telmisartan  (MICARDIS ) 20 MG tablet TAKE 1 TABLET BY MOUTH EVERY DAY 90 tablet 0   Facility-Administered Medications Prior to Visit  Medication Dose Route Frequency Provider Last Rate Last Admin   denosumab  (PROLIA ) injection 60 mg  60 mg Subcutaneous Once Eural Holzschuh L, MD       [START ON 12/30/2023] denosumab  (PROLIA )  injection 60 mg  60 mg Subcutaneous Once Serria Sloma L, MD        Review of Systems;  Patient denies headache, fevers, malaise, unintentional weight loss, skin rash, eye pain, sinus congestion and sinus pain, sore throat, dysphagia,  hemoptysis , cough, dyspnea, wheezing, chest pain, palpitations, orthopnea, edema, abdominal pain, nausea, melena, diarrhea, constipation, flank pain, dysuria, hematuria, urinary  Frequency, nocturia, numbness, tingling, seizures,  Focal weakness, Loss of consciousness,  Tremor, insomnia, depression, anxiety, and suicidal ideation.      Objective:  BP 114/66   Pulse 81   Ht 5' 3 (1.6 m)   Wt 151 lb 9.6 oz (68.8 kg)   SpO2 96%   BMI 26.85 kg/m   BP Readings from Last 3 Encounters:  08/28/23 114/66  05/23/23 130/68  05/14/23 131/84    Wt Readings  from Last 3 Encounters:  08/28/23 151 lb 9.6 oz (68.8 kg)  05/23/23 152 lb (68.9 kg)  05/14/23 145 lb (65.8 kg)    Physical Exam Vitals reviewed.  Constitutional:      General: She is not in acute distress.    Appearance: Normal appearance. She is normal weight. She is not ill-appearing, toxic-appearing or diaphoretic.  HENT:     Head: Normocephalic.  Eyes:     General: No scleral icterus.       Right eye: No discharge.        Left eye: No discharge.     Conjunctiva/sclera: Conjunctivae normal.  Cardiovascular:     Rate and Rhythm: Normal rate and regular rhythm.     Heart sounds: Normal heart sounds.  Pulmonary:     Effort: Pulmonary effort is normal. No respiratory distress.     Breath sounds: Normal breath sounds.  Musculoskeletal:        General: Normal range of motion.  Skin:    General: Skin is warm and dry.  Neurological:     General: No focal deficit present.     Mental Status: She is alert and oriented to person, place, and time. Mental status is at baseline.  Psychiatric:        Mood and Affect: Mood normal.        Behavior: Behavior normal.        Thought Content: Thought content normal.        Judgment: Judgment normal.    Lab Results  Component Value Date   HGBA1C 5.7 01/09/2023   HGBA1C 5.5 06/11/2022   HGBA1C 5.6 02/10/2019    Lab Results  Component Value Date   CREATININE 0.66 05/14/2023   CREATININE 0.68 02/25/2023   CREATININE 0.95 01/28/2023    Lab Results  Component Value Date   WBC 8.8 05/14/2023   HGB 13.3 05/14/2023   HCT 40.8 05/14/2023   PLT 262 05/14/2023   GLUCOSE 121 (H) 05/14/2023   CHOL 172 01/09/2023   TRIG 118.0 01/09/2023   HDL 90.70 01/09/2023   LDLDIRECT 60.0 01/09/2023   LDLCALC 57 01/09/2023   ALT 16 05/14/2023   AST 15 05/14/2023   NA 138 05/14/2023   K 3.9 05/14/2023   CL 105 05/14/2023   CREATININE 0.66 05/14/2023   BUN 18 05/14/2023   CO2 26 05/14/2023   TSH 2.30 06/11/2022   INR 1.1 06/17/2020   HGBA1C  5.7 01/09/2023    US  Abdomen Limited RUQ (LIVER/GB) Result Date: 05/29/2023 CLINICAL DATA:  Gallbladder mass seen on recent CT. EXAM: ULTRASOUND ABDOMEN LIMITED RIGHT UPPER QUADRANT COMPARISON:  CT abdomen and pelvis  May 14, 2023 FINDINGS: Gallbladder: Gallstones are identified, largest measures 1.1 cm. No definite gallbladder mass mass is identified. No wall thickening visualized. No sonographic Murphy sign noted by sonographer. Common bile duct: Diameter: 3.9 mm Liver: No focal lesion identified. Within normal limits in parenchymal echogenicity. Portal vein is patent on color Doppler imaging with normal direction of blood flow towards the liver. Other: None. IMPRESSION: Cholelithiasis without sonographic evidence of acute cholecystitis. No definite gallbladder mass identified. Electronically Signed   By: Craig Farr M.D.   On: 05/29/2023 09:51    Assessment & Plan:  .Hyperlipidemia LDL goal <70 -     Lipid panel -     LDL cholesterol, direct  Primary hypertension Assessment & Plan: Patient is toleratingtelmisartan  and and taking as as prescribed  Home BP readings have been done about once per week and are  generally < 130/80 .  No changes today  Orders: -     Telmisartan ; Take 1 tablet (20 mg total) by mouth daily.  Dispense: 90 tablet; Refill: 1 -     Comprehensive metabolic panel with GFR -     Microalbumin / creatinine urine ratio  Impaired fasting glucose -     Comprehensive metabolic panel with GFR -     Hemoglobin A1c  Fatigue, unspecified type -     TSH  Age-related osteoporosis without current pathological fracture Assessment & Plan: Mnaged with Prolia   since 2017. Last dose May 2025   She has  no history of fractures. Her  T scores have improved into the osteopenia range.  Continue Prolia  every 6 months calcium  and Vitamin D  supplementation and repeat DEXA (last one 2021)   Last vitamin D   Orders: -     DG Bone Density; Future  Vitamin D  deficiency -     VITAMIN  D 25 Hydroxy (Vit-D Deficiency, Fractures)  Right medial knee pain Assessment & Plan: With pain and swelling occurring in the popliteal space at times.  Defers orthopedic evaluation.     Nausea Assessment & Plan: Mild and  persistent for the past month.  Resume omeprazole  20 mg daily, if no improvement n 3 weeks will obtain CT abd and pelvis to rule out malignancy   Vertigo Assessment & Plan: Occurring 2-3 times per year.     Neuropathy of both feet Assessment & Plan: Idiopathic,  Since 2013.  Screening labs done and b12 deficiency identified and treated,  Medication deferred.  She reports legs feeling heavy.     Insomnia due to anxiety and fear Assessment & Plan: She remains ambien  dependent .  I have reviewed the dangers of prescribing this drug to the elderly with patient.  She has been ambien  dependent for over five years, and prior attempts to use an alternative medication  have been unsuccessful resulting in loss of sleep.  She did not sleep  well during a trial of  reducing the dose to 5mg   And is willing to accept the risks of adverse events. Refilled today at the 10 mg dose    Other orders -     Escitalopram  Oxalate; TAKE 1 TABLET(10 MG) BY MOUTH DAILY  Dispense: 90 tablet; Refill: 1 -     Omeprazole ; Take 1 capsule (20 mg total) by mouth daily.     I spent 34 minutes on the day of this face to face encounter reviewing patient's  most recent visit with neurology,  prior non surgical procedures,  labs and imaging studies,  reviewing the assessment  and plan with patient, and post visit ordering and reviewing of  diagnostics and therapeutics with patient  .   Follow-up: No follow-ups on file.   Verneita LITTIE Kettering, MD

## 2023-08-28 NOTE — Assessment & Plan Note (Signed)
She remains ambien dependent .  I have reviewed the dangers of prescribing this drug to the elderly with patient.  She has been ambien dependent for over five years, and prior attempts to use an alternative medication  have been unsuccessful resulting in loss of sleep.  She did not sleep  well during a trial of  reducing the dose to 5mg  And is willing to accept the risks of adverse events. Refilled today at the 10 mg dose  

## 2023-08-28 NOTE — Assessment & Plan Note (Signed)
 Patient is toleratingtelmisartan  and and taking as as prescribed  Home BP readings have been done about once per week and are  generally < 130/80 .  No changes today

## 2023-08-28 NOTE — Assessment & Plan Note (Signed)
 Occurring 2-3 times per year.

## 2023-08-28 NOTE — Assessment & Plan Note (Signed)
 Mild and  persistent for the past month.  Resume omeprazole  20 mg daily, if no improvement n 3 weeks will obtain CT abd and pelvis to rule out malignancy

## 2023-08-28 NOTE — Assessment & Plan Note (Signed)
 Idiopathic,  Since 2013.  Screening labs done and b12 deficiency identified and treated,  Medication deferred.  She reports legs feeling heavy.

## 2023-08-28 NOTE — Telephone Encounter (Signed)
 Disregard pt sent another message saying that she seen her summary

## 2023-08-28 NOTE — Assessment & Plan Note (Signed)
 With pain and swelling occurring in the popliteal space at times.  Defers orthopedic evaluation.

## 2023-08-28 NOTE — Assessment & Plan Note (Signed)
 Mnaged with Prolia   since 2017. Last dose May 2025   She has  no history of fractures. Her  T scores have improved into the osteopenia range.  Continue Prolia  every 6 months calcium  and Vitamin D  supplementation and repeat DEXA (last one 2021)   Last vitamin D 

## 2023-08-28 NOTE — Patient Instructions (Addendum)
  Your  DEXA scan  been ordered.  You are encouraged (required!) to call to schedule your own  appointment at [facility]  , and their phone number is [phone]   Please resume omeprazole  once daily on an empty stomach to  see if the nausea resolves. If it does not after 3 weeks,  let me know because I will order a CT abd and pelvis

## 2023-08-29 ENCOUNTER — Ambulatory Visit

## 2023-08-29 LAB — LIPID PANEL
Cholesterol: 183 mg/dL (ref 0–200)
HDL: 87.3 mg/dL (ref >39.00)
LDL Cholesterol: 70 mg/dL (ref 0–99)
NonHDL: 95.31
Total CHOL/HDL Ratio: 2
Triglycerides: 126 mg/dL (ref 0.0–149.0)
VLDL: 25.2 mg/dL (ref 0.0–40.0)

## 2023-08-29 LAB — COMPREHENSIVE METABOLIC PANEL WITH GFR
ALT: 16 U/L (ref 0–35)
AST: 16 U/L (ref 0–37)
Albumin: 4.5 g/dL (ref 3.5–5.2)
Alkaline Phosphatase: 55 U/L (ref 39–117)
BUN: 11 mg/dL (ref 6–23)
CO2: 29 meq/L (ref 19–32)
Calcium: 9.1 mg/dL (ref 8.4–10.5)
Chloride: 104 meq/L (ref 96–112)
Creatinine, Ser: 0.75 mg/dL (ref 0.40–1.20)
GFR: 70.89 mL/min (ref >60.00)
Glucose, Bld: 75 mg/dL (ref 70–99)
Potassium: 4.5 meq/L (ref 3.5–5.1)
Sodium: 139 meq/L (ref 135–145)
Total Bilirubin: 0.3 mg/dL (ref 0.2–1.2)
Total Protein: 6.7 g/dL (ref 6.0–8.3)

## 2023-08-29 LAB — VITAMIN D 25 HYDROXY (VIT D DEFICIENCY, FRACTURES): VITD: 30.81 ng/mL (ref 30.00–100.00)

## 2023-08-29 LAB — HEMOGLOBIN A1C: Hgb A1c MFr Bld: 5.7 % (ref 4.6–6.5)

## 2023-08-29 LAB — TSH: TSH: 2.8 u[IU]/mL (ref 0.35–5.50)

## 2023-08-29 LAB — MICROALBUMIN / CREATININE URINE RATIO
Creatinine,U: 31 mg/dL
Microalb Creat Ratio: UNDETERMINED mg/g (ref 0.0–30.0)
Microalb, Ur: <0.7 mg/dL

## 2023-08-29 LAB — LDL CHOLESTEROL, DIRECT: Direct LDL: 72 mg/dL

## 2023-09-01 ENCOUNTER — Ambulatory Visit: Payer: Self-pay | Admitting: Internal Medicine

## 2023-09-03 ENCOUNTER — Ambulatory Visit: Admitting: Physical Therapy

## 2023-09-05 ENCOUNTER — Ambulatory Visit: Admitting: Physical Therapy

## 2023-09-10 ENCOUNTER — Ambulatory Visit: Admitting: Physical Therapy

## 2023-09-12 ENCOUNTER — Ambulatory Visit: Admitting: Physical Therapy

## 2023-09-17 ENCOUNTER — Ambulatory Visit: Admitting: Physical Therapy

## 2023-09-19 ENCOUNTER — Ambulatory Visit: Admitting: Physical Therapy

## 2023-09-24 ENCOUNTER — Ambulatory Visit: Admitting: Physical Therapy

## 2023-09-26 ENCOUNTER — Ambulatory Visit: Admitting: Physical Therapy

## 2023-09-30 ENCOUNTER — Encounter: Payer: Self-pay | Admitting: Internal Medicine

## 2023-10-01 ENCOUNTER — Other Ambulatory Visit: Payer: Self-pay | Admitting: Internal Medicine

## 2023-10-01 ENCOUNTER — Ambulatory Visit: Admitting: Physical Therapy

## 2023-10-01 ENCOUNTER — Other Ambulatory Visit (INDEPENDENT_AMBULATORY_CARE_PROVIDER_SITE_OTHER)

## 2023-10-01 DIAGNOSIS — R1031 Right lower quadrant pain: Secondary | ICD-10-CM

## 2023-10-02 LAB — URINALYSIS, ROUTINE W REFLEX MICROSCOPIC
Bilirubin Urine: NEGATIVE
Hgb urine dipstick: NEGATIVE
Ketones, ur: 15 — AB
Nitrite: NEGATIVE
Specific Gravity, Urine: 1.015 (ref 1.000–1.030)
Total Protein, Urine: NEGATIVE
Urine Glucose: NEGATIVE
Urobilinogen, UA: 0.2 (ref 0.0–1.0)
pH: 6.5 (ref 5.0–8.0)

## 2023-10-02 LAB — URINE CULTURE
MICRO NUMBER:: 16910236
Result:: NO GROWTH
SPECIMEN QUALITY:: ADEQUATE

## 2023-10-03 ENCOUNTER — Ambulatory Visit: Admitting: Physical Therapy

## 2023-10-03 ENCOUNTER — Ambulatory Visit: Payer: Self-pay | Admitting: Internal Medicine

## 2023-10-08 ENCOUNTER — Ambulatory Visit: Admitting: Physical Therapy

## 2023-10-09 ENCOUNTER — Ambulatory Visit
Admission: RE | Admit: 2023-10-09 | Discharge: 2023-10-09 | Disposition: A | Discharge status: Home / Self Care | Source: Ambulatory Visit | Attending: Internal Medicine | Admitting: Internal Medicine

## 2023-10-09 DIAGNOSIS — K8020 Calculus of gallbladder without cholecystitis without obstruction: Secondary | ICD-10-CM | POA: Diagnosis not present

## 2023-10-09 DIAGNOSIS — R1031 Right lower quadrant pain: Secondary | ICD-10-CM | POA: Insufficient documentation

## 2023-10-09 DIAGNOSIS — R140 Abdominal distension (gaseous): Secondary | ICD-10-CM | POA: Diagnosis not present

## 2023-10-09 DIAGNOSIS — K5730 Diverticulosis of large intestine without perforation or abscess without bleeding: Secondary | ICD-10-CM | POA: Diagnosis not present

## 2023-10-09 MED ORDER — IOHEXOL 300 MG/ML  SOLN
80.0000 mL | Freq: Once | INTRAMUSCULAR | Status: AC | PRN
  Administered 2023-10-09: 80 mL via INTRAVENOUS

## 2023-10-10 ENCOUNTER — Ambulatory Visit: Admitting: Physical Therapy

## 2023-10-10 DIAGNOSIS — D2261 Melanocytic nevi of right upper limb, including shoulder: Secondary | ICD-10-CM | POA: Diagnosis not present

## 2023-10-10 DIAGNOSIS — D2271 Melanocytic nevi of right lower limb, including hip: Secondary | ICD-10-CM | POA: Diagnosis not present

## 2023-10-10 DIAGNOSIS — L570 Actinic keratosis: Secondary | ICD-10-CM | POA: Diagnosis not present

## 2023-10-10 DIAGNOSIS — D2272 Melanocytic nevi of left lower limb, including hip: Secondary | ICD-10-CM | POA: Diagnosis not present

## 2023-10-10 DIAGNOSIS — D225 Melanocytic nevi of trunk: Secondary | ICD-10-CM | POA: Diagnosis not present

## 2023-10-10 DIAGNOSIS — L218 Other seborrheic dermatitis: Secondary | ICD-10-CM | POA: Diagnosis not present

## 2023-10-10 DIAGNOSIS — L821 Other seborrheic keratosis: Secondary | ICD-10-CM | POA: Diagnosis not present

## 2023-10-10 DIAGNOSIS — D2262 Melanocytic nevi of left upper limb, including shoulder: Secondary | ICD-10-CM | POA: Diagnosis not present

## 2023-10-14 ENCOUNTER — Ambulatory Visit
Admission: RE | Admit: 2023-10-14 | Discharge: 2023-10-14 | Disposition: A | Discharge status: Home / Self Care | Source: Ambulatory Visit | Attending: Internal Medicine | Admitting: Internal Medicine

## 2023-10-14 ENCOUNTER — Encounter: Payer: Self-pay | Admitting: Internal Medicine

## 2023-10-14 ENCOUNTER — Ambulatory Visit: Payer: Self-pay | Admitting: Internal Medicine

## 2023-10-14 DIAGNOSIS — M810 Age-related osteoporosis without current pathological fracture: Secondary | ICD-10-CM | POA: Diagnosis not present

## 2023-10-14 DIAGNOSIS — K828 Other specified diseases of gallbladder: Secondary | ICD-10-CM

## 2023-10-14 DIAGNOSIS — M8589 Other specified disorders of bone density and structure, multiple sites: Secondary | ICD-10-CM | POA: Diagnosis not present

## 2023-10-14 DIAGNOSIS — Z780 Asymptomatic menopausal state: Secondary | ICD-10-CM | POA: Diagnosis not present

## 2023-10-14 NOTE — Telephone Encounter (Signed)
 Pt inquiring about ct results. Informed pt results were finalized earlier this morning and had not been review yet

## 2023-10-14 NOTE — Assessment & Plan Note (Signed)
 No GB  mass was seen on April ultrasound or September 2025 CT

## 2023-10-15 ENCOUNTER — Ambulatory Visit: Admitting: Physical Therapy

## 2023-10-17 ENCOUNTER — Ambulatory Visit: Admitting: Physical Therapy

## 2023-11-11 ENCOUNTER — Telehealth: Payer: Self-pay

## 2023-11-11 DIAGNOSIS — M810 Age-related osteoporosis without current pathological fracture: Secondary | ICD-10-CM

## 2023-11-11 NOTE — Telephone Encounter (Signed)
 Prolia  VOB initiated via [email or web address]  Next Prolia  inj DUE: 12/12/23

## 2023-11-12 ENCOUNTER — Other Ambulatory Visit (HOSPITAL_COMMUNITY): Payer: Self-pay

## 2023-11-12 NOTE — Telephone Encounter (Signed)
 Pt ready for scheduling for PROLIA  on or after : 12/12/23  Option# 1: Buy/Bill (Office supplied medication)  Out-of-pocket cost due at time of clinic visit: $357 [facility] will not release benefits, following Medicare Advantage guideline)  Number of injection/visits approved: 2  Primary: [employer] Prolia  co-insurance: 20% Admin fee co-insurance: 20%  Secondary: --- Prolia  co-insurance:  Admin fee co-insurance:   Medical Benefit Details: Date Benefits were checked: 11/11/23 Deductible: NO/ Coinsurance: 20%/ Admin Fee: 20%  Prior Auth: APPROVED PA# [id number] Expiration Date: 01/30/23-01/29/24  # of doses approved: 2 ----------------------------------------------------------------------- Option# 2- Med Obtained from pharmacy:  Pharmacy benefit: Copay $64 (Paid to pharmacy) Admin Fee: 20% (Pay at clinic)  Prior Auth: N/A PA# Expiration Date:   # of doses approved:   If patient wants fill through the pharmacy benefit please send prescription to: [facility], and include estimated need by date in rx notes. Pharmacy will ship medication directly to the office.  Patient NOT eligible for Prolia  Copay Card. Copay Card can make patient's cost as little as $25. Link to apply: [email or web address]  ** This summary of benefits is an estimation of the patient's out-of-pocket cost. Exact cost may very based on individual plan coverage.

## 2023-11-12 NOTE — Telephone Encounter (Signed)
 SABRA

## 2023-12-10 ENCOUNTER — Other Ambulatory Visit: Payer: Self-pay

## 2023-12-10 MED ORDER — DENOSUMAB 60 MG/ML ~~LOC~~ SOSY
60.0000 mg | PREFILLED_SYRINGE | SUBCUTANEOUS | 1 refills | Status: AC
  Filled 2023-12-17 – 2023-12-23 (×2): qty 1, 180d supply, fill #0

## 2023-12-10 NOTE — Addendum Note (Signed)
 Addended by: BRIEN SHARENE RAMAN on: 12/10/2023 11:21 AM   Modules accepted: Orders

## 2023-12-10 NOTE — Progress Notes (Signed)
 See benefits encounter from 11/11/23. Copay is $64.

## 2023-12-12 ENCOUNTER — Other Ambulatory Visit: Payer: Self-pay

## 2023-12-17 ENCOUNTER — Other Ambulatory Visit (HOSPITAL_COMMUNITY): Payer: Self-pay

## 2023-12-17 ENCOUNTER — Other Ambulatory Visit: Payer: Self-pay

## 2023-12-19 ENCOUNTER — Other Ambulatory Visit: Payer: Self-pay

## 2023-12-23 ENCOUNTER — Other Ambulatory Visit: Payer: Self-pay

## 2023-12-23 NOTE — Progress Notes (Signed)
 Specialty Pharmacy Initial Fill Coordination Note  B Sciarra Laura [name] is a 89 y.o. female contacted today regarding initial fill of specialty medication(s) Denosumab  (PROLIA )   Patient requested Courier to Provider Office   Delivery date: 12/30/23   Verified address: [facility] [address]   Medication will be filled on: 12/27/23   Patient is aware of $64 copayment.

## 2023-12-30 ENCOUNTER — Encounter: Payer: Self-pay | Admitting: *Deleted

## 2023-12-31 ENCOUNTER — Ambulatory Visit

## 2023-12-31 DIAGNOSIS — M810 Age-related osteoporosis without current pathological fracture: Secondary | ICD-10-CM

## 2023-12-31 MED ORDER — DENOSUMAB 60 MG/ML ~~LOC~~ SOSY: 60.0000 mg | PREFILLED_SYRINGE | Freq: Once | SUBCUTANEOUS | Status: AC

## 2023-12-31 NOTE — Progress Notes (Signed)
 Pt received Prolia  injection in right arm Pt tolerated it well with no complaints or concerns.

## 2024-01-03 NOTE — Telephone Encounter (Signed)
 Injection received on 12/31/23

## 2024-01-21 ENCOUNTER — Other Ambulatory Visit: Payer: Self-pay | Admitting: Internal Medicine

## 2024-02-09 ENCOUNTER — Other Ambulatory Visit: Payer: Self-pay | Admitting: Internal Medicine

## 2024-04-17 ENCOUNTER — Encounter: Admitting: Internal Medicine
